# Patient Record
Sex: Male | Born: 1945 | Race: White | Hispanic: No | Marital: Married | Smoking: Former smoker
Health system: Southern US, Community
[De-identification: ages and names within clinical notes are randomized; demographics above are authoritative.]

## PROBLEM LIST (undated history)

## (undated) DIAGNOSIS — A159 Respiratory tuberculosis unspecified: Secondary | ICD-10-CM

## (undated) DIAGNOSIS — I219 Acute myocardial infarction, unspecified: Secondary | ICD-10-CM

## (undated) DIAGNOSIS — N400 Enlarged prostate without lower urinary tract symptoms: Secondary | ICD-10-CM

## (undated) DIAGNOSIS — IMO0002 Reserved for concepts with insufficient information to code with codable children: Secondary | ICD-10-CM

## (undated) DIAGNOSIS — J449 Chronic obstructive pulmonary disease, unspecified: Secondary | ICD-10-CM

## (undated) DIAGNOSIS — E039 Hypothyroidism, unspecified: Secondary | ICD-10-CM

## (undated) DIAGNOSIS — Z86718 Personal history of other venous thrombosis and embolism: Secondary | ICD-10-CM

## (undated) DIAGNOSIS — Z860100 Personal history of colon polyps, unspecified: Secondary | ICD-10-CM

## (undated) DIAGNOSIS — I639 Cerebral infarction, unspecified: Secondary | ICD-10-CM

## (undated) DIAGNOSIS — H04129 Dry eye syndrome of unspecified lacrimal gland: Secondary | ICD-10-CM

## (undated) DIAGNOSIS — K259 Gastric ulcer, unspecified as acute or chronic, without hemorrhage or perforation: Secondary | ICD-10-CM

## (undated) DIAGNOSIS — H409 Unspecified glaucoma: Secondary | ICD-10-CM

## (undated) DIAGNOSIS — N289 Disorder of kidney and ureter, unspecified: Secondary | ICD-10-CM

## (undated) DIAGNOSIS — Z5189 Encounter for other specified aftercare: Secondary | ICD-10-CM

## (undated) DIAGNOSIS — I10 Essential (primary) hypertension: Secondary | ICD-10-CM

## (undated) DIAGNOSIS — E785 Hyperlipidemia, unspecified: Secondary | ICD-10-CM

## (undated) DIAGNOSIS — I4891 Unspecified atrial fibrillation: Secondary | ICD-10-CM

## (undated) DIAGNOSIS — I2510 Atherosclerotic heart disease of native coronary artery without angina pectoris: Secondary | ICD-10-CM

## (undated) DIAGNOSIS — M1990 Unspecified osteoarthritis, unspecified site: Secondary | ICD-10-CM

## (undated) HISTORY — DX: Chronic obstructive pulmonary disease, unspecified: J44.9

## (undated) HISTORY — DX: Hypothyroidism, unspecified: E03.9

## (undated) HISTORY — DX: Gastric ulcer, unspecified as acute or chronic, without hemorrhage or perforation: K25.9

## (undated) HISTORY — PX: COLONOSCOPY: SHX174

## (undated) HISTORY — DX: Personal history of other venous thrombosis and embolism: Z86.718

## (undated) HISTORY — DX: Respiratory tuberculosis unspecified: A15.9

## (undated) HISTORY — DX: Essential (primary) hypertension: I10

## (undated) HISTORY — PX: LUMBAR SPINE SURGERY: SHX701

## (undated) HISTORY — DX: Hyperlipidemia, unspecified: E78.5

## (undated) HISTORY — PX: EYE SURGERY: SHX253

## (undated) HISTORY — PX: TRANSURETHRAL RESECTION OF PROSTATE: SHX73

## (undated) HISTORY — DX: Personal history of colon polyps, unspecified: Z86.0100

## (undated) HISTORY — DX: Disorder of kidney and ureter, unspecified: N28.9

## (undated) HISTORY — DX: Unspecified atrial fibrillation: I48.91

## (undated) HISTORY — DX: Encounter for other specified aftercare: Z51.89

## (undated) HISTORY — DX: Dry eye syndrome of unspecified lacrimal gland: H04.129

## (undated) HISTORY — DX: Unspecified glaucoma: H40.9

## (undated) HISTORY — DX: Atherosclerotic heart disease of native coronary artery without angina pectoris: I25.10

## (undated) HISTORY — DX: Cerebral infarction, unspecified: I63.9

## (undated) HISTORY — PX: ESOPHAGOGASTRODUODENOSCOPY: SHX1529

## (undated) HISTORY — DX: Reserved for concepts with insufficient information to code with codable children: IMO0002

## (undated) HISTORY — PX: HERNIA MESH REMOVAL: SHX1745

## (undated) HISTORY — PX: OTHER SURGICAL HISTORY: SHX169

## (undated) HISTORY — DX: Personal history of colonic polyps: Z86.010

## (undated) HISTORY — DX: Unspecified osteoarthritis, unspecified site: M19.90

## (undated) HISTORY — DX: Benign prostatic hyperplasia without lower urinary tract symptoms: N40.0

---
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
## 1990-11-09 DIAGNOSIS — IMO0002 Reserved for concepts with insufficient information to code with codable children: Secondary | ICD-10-CM

## 1990-11-09 HISTORY — DX: Reserved for concepts with insufficient information to code with codable children: IMO0002

## 2007-06-30 ENCOUNTER — Ambulatory Visit: Payer: Self-pay | Admitting: Family Medicine

## 2007-06-30 DIAGNOSIS — H81399 Other peripheral vertigo, unspecified ear: Secondary | ICD-10-CM | POA: Insufficient documentation

## 2007-06-30 DIAGNOSIS — I10 Essential (primary) hypertension: Secondary | ICD-10-CM | POA: Insufficient documentation

## 2007-06-30 DIAGNOSIS — M545 Low back pain, unspecified: Secondary | ICD-10-CM | POA: Insufficient documentation

## 2007-06-30 DIAGNOSIS — E785 Hyperlipidemia, unspecified: Secondary | ICD-10-CM | POA: Insufficient documentation

## 2007-07-26 DIAGNOSIS — N184 Chronic kidney disease, stage 4 (severe): Secondary | ICD-10-CM | POA: Insufficient documentation

## 2007-07-27 ENCOUNTER — Encounter: Payer: Self-pay | Admitting: Family Medicine

## 2007-08-01 ENCOUNTER — Ambulatory Visit: Payer: Self-pay | Admitting: Family Medicine

## 2007-10-03 ENCOUNTER — Ambulatory Visit: Payer: Self-pay | Admitting: Family Medicine

## 2008-01-04 ENCOUNTER — Ambulatory Visit: Payer: Self-pay | Admitting: Family Medicine

## 2008-01-04 DIAGNOSIS — K5900 Constipation, unspecified: Secondary | ICD-10-CM

## 2008-07-03 ENCOUNTER — Ambulatory Visit: Payer: Self-pay | Admitting: Family Medicine

## 2008-07-04 ENCOUNTER — Encounter: Payer: Self-pay | Admitting: Family Medicine

## 2008-07-05 LAB — CONVERTED CEMR LAB
CO2: 24 meq/L (ref 19–32)
Calcium: 9.7 mg/dL (ref 8.4–10.5)
Chloride: 105 meq/L (ref 96–112)
Creatinine, Ser: 1.72 mg/dL — ABNORMAL HIGH (ref 0.40–1.50)
Glucose, Bld: 87 mg/dL (ref 70–99)
Total Bilirubin: 0.3 mg/dL (ref 0.3–1.2)

## 2008-07-23 ENCOUNTER — Telehealth: Payer: Self-pay | Admitting: Family Medicine

## 2008-08-03 ENCOUNTER — Encounter: Payer: Self-pay | Admitting: Family Medicine

## 2008-08-03 ENCOUNTER — Telehealth: Payer: Self-pay | Admitting: Family Medicine

## 2008-11-09 DIAGNOSIS — I639 Cerebral infarction, unspecified: Secondary | ICD-10-CM

## 2008-11-09 DIAGNOSIS — I219 Acute myocardial infarction, unspecified: Secondary | ICD-10-CM

## 2008-11-09 HISTORY — DX: Acute myocardial infarction, unspecified: I21.9

## 2008-11-09 HISTORY — DX: Cerebral infarction, unspecified: I63.9

## 2009-03-05 ENCOUNTER — Encounter: Payer: Self-pay | Admitting: Family Medicine

## 2009-04-12 ENCOUNTER — Encounter: Payer: Self-pay | Admitting: Family Medicine

## 2009-04-22 ENCOUNTER — Ambulatory Visit: Payer: Self-pay | Admitting: Family Medicine

## 2009-04-22 DIAGNOSIS — Z8673 Personal history of transient ischemic attack (TIA), and cerebral infarction without residual deficits: Secondary | ICD-10-CM | POA: Insufficient documentation

## 2009-04-23 ENCOUNTER — Encounter: Payer: Self-pay | Admitting: Family Medicine

## 2009-04-23 LAB — CONVERTED CEMR LAB
CO2: 25 meq/L (ref 19–32)
Calcium: 9.6 mg/dL (ref 8.4–10.5)
Glucose, Bld: 97 mg/dL (ref 70–99)
Potassium: 4.8 meq/L (ref 3.5–5.3)
Sodium: 143 meq/L (ref 135–145)

## 2009-04-28 ENCOUNTER — Encounter: Payer: Self-pay | Admitting: Family Medicine

## 2009-04-29 ENCOUNTER — Ambulatory Visit: Payer: Self-pay | Admitting: Family Medicine

## 2009-04-29 LAB — HM COLONOSCOPY

## 2009-05-02 ENCOUNTER — Encounter: Payer: Self-pay | Admitting: Family Medicine

## 2009-05-07 ENCOUNTER — Encounter: Payer: Self-pay | Admitting: Family Medicine

## 2009-05-21 ENCOUNTER — Ambulatory Visit: Payer: Self-pay | Admitting: Family Medicine

## 2009-05-21 DIAGNOSIS — R609 Edema, unspecified: Secondary | ICD-10-CM | POA: Insufficient documentation

## 2009-05-21 LAB — CONVERTED CEMR LAB: Microalbumin U total vol: 30 mg/L

## 2009-05-22 ENCOUNTER — Encounter: Payer: Self-pay | Admitting: Family Medicine

## 2009-05-22 LAB — CONVERTED CEMR LAB
ALT: 19 units/L (ref 0–53)
CO2: 21 meq/L (ref 19–32)
Calcium: 9.7 mg/dL (ref 8.4–10.5)
Chloride: 106 meq/L (ref 96–112)
Free T4: 1.09 ng/dL (ref 0.80–1.80)
Glucose, Bld: 104 mg/dL — ABNORMAL HIGH (ref 70–99)
HCT: 39.8 % (ref 39.0–52.0)
MCV: 97.5 fL (ref 78.0–100.0)
Platelets: 247 10*3/uL (ref 150–400)
RBC: 4.08 M/uL — ABNORMAL LOW (ref 4.22–5.81)
Sodium: 142 meq/L (ref 135–145)
TSH: 4.512 microintl units/mL — ABNORMAL HIGH (ref 0.350–4.500)
Total Bilirubin: 0.5 mg/dL (ref 0.3–1.2)
Total Protein: 6.8 g/dL (ref 6.0–8.3)
WBC: 5.8 10*3/uL (ref 4.0–10.5)

## 2009-06-05 ENCOUNTER — Encounter: Payer: Self-pay | Admitting: Family Medicine

## 2009-06-25 ENCOUNTER — Encounter: Payer: Self-pay | Admitting: Family Medicine

## 2009-07-03 ENCOUNTER — Telehealth: Payer: Self-pay | Admitting: Family Medicine

## 2009-07-04 ENCOUNTER — Encounter: Payer: Self-pay | Admitting: Family Medicine

## 2009-07-05 ENCOUNTER — Encounter: Payer: Self-pay | Admitting: Family Medicine

## 2009-07-05 LAB — CONVERTED CEMR LAB
Free T4: 1.14 ng/dL (ref 0.80–1.80)
LDL Cholesterol: 136 mg/dL — ABNORMAL HIGH (ref 0–99)
T3, Free: 2.8 pg/mL (ref 2.3–4.2)
TSH: 7.442 microintl units/mL — ABNORMAL HIGH (ref 0.350–4.500)
VLDL: 44 mg/dL — ABNORMAL HIGH (ref 0–40)

## 2009-07-17 ENCOUNTER — Encounter: Payer: Self-pay | Admitting: Family Medicine

## 2009-08-05 ENCOUNTER — Encounter: Payer: Self-pay | Admitting: Family Medicine

## 2009-08-21 ENCOUNTER — Ambulatory Visit: Payer: Self-pay | Admitting: Family Medicine

## 2009-09-05 ENCOUNTER — Ambulatory Visit: Payer: Self-pay | Admitting: Family Medicine

## 2009-09-06 ENCOUNTER — Encounter: Payer: Self-pay | Admitting: Family Medicine

## 2009-09-09 ENCOUNTER — Telehealth: Payer: Self-pay | Admitting: Family Medicine

## 2009-09-09 ENCOUNTER — Encounter: Payer: Self-pay | Admitting: Family Medicine

## 2009-09-09 LAB — CONVERTED CEMR LAB
ALT: 13 units/L (ref 0–53)
AST: 14 units/L (ref 0–37)
Alkaline Phosphatase: 91 units/L (ref 39–117)
CO2: 25 meq/L (ref 19–32)
Creatinine, Ser: 1.66 mg/dL — ABNORMAL HIGH (ref 0.40–1.50)
Sodium: 140 meq/L (ref 135–145)
T3, Free: 2.6 pg/mL (ref 2.3–4.2)
TSH: 7.489 microintl units/mL — ABNORMAL HIGH (ref 0.350–4.500)
Total Bilirubin: 0.5 mg/dL (ref 0.3–1.2)
Total Protein: 7 g/dL (ref 6.0–8.3)

## 2009-12-17 ENCOUNTER — Ambulatory Visit: Payer: Self-pay | Admitting: Family Medicine

## 2009-12-17 DIAGNOSIS — N529 Male erectile dysfunction, unspecified: Secondary | ICD-10-CM | POA: Insufficient documentation

## 2009-12-18 LAB — CONVERTED CEMR LAB
Cholesterol: 227 mg/dL — ABNORMAL HIGH (ref 0–200)
HCT: 41.2 % (ref 39.0–52.0)
HDL: 37 mg/dL — ABNORMAL LOW (ref >39)
Platelets: 202 10*3/uL (ref 150–400)
RDW: 13.5 % (ref 11.5–15.5)
Sex Hormone Binding: 63 nmol/L (ref 13–71)
TSH: 5.404 microintl units/mL — ABNORMAL HIGH (ref 0.350–4.500)
Testosterone-% Free: 1.3 % — ABNORMAL LOW (ref 1.6–2.9)
Testosterone: 415.11 ng/dL (ref 350–890)

## 2010-01-28 ENCOUNTER — Ambulatory Visit: Payer: Self-pay | Admitting: Family Medicine

## 2010-01-28 DIAGNOSIS — E039 Hypothyroidism, unspecified: Secondary | ICD-10-CM | POA: Insufficient documentation

## 2010-02-05 ENCOUNTER — Encounter: Payer: Self-pay | Admitting: Family Medicine

## 2010-02-06 LAB — CONVERTED CEMR LAB: TSH: 5.718 microintl units/mL — ABNORMAL HIGH (ref 0.350–4.500)

## 2010-03-07 ENCOUNTER — Encounter: Payer: Self-pay | Admitting: Family Medicine

## 2010-04-29 ENCOUNTER — Encounter: Payer: Self-pay | Admitting: Family Medicine

## 2010-05-26 ENCOUNTER — Telehealth: Payer: Self-pay | Admitting: Family Medicine

## 2010-06-09 ENCOUNTER — Ambulatory Visit: Payer: Self-pay | Admitting: Family Medicine

## 2010-06-10 ENCOUNTER — Encounter: Payer: Self-pay | Admitting: Family Medicine

## 2010-06-10 LAB — CONVERTED CEMR LAB
ALT: 15 units/L (ref 0–53)
AST: 18 units/L (ref 0–37)
Albumin: 4.4 g/dL (ref 3.5–5.2)
BUN: 30 mg/dL — ABNORMAL HIGH (ref 6–23)
CO2: 20 meq/L (ref 19–32)
Calcium: 9.8 mg/dL (ref 8.4–10.5)
Chloride: 107 meq/L (ref 96–112)
Potassium: 5.2 meq/L (ref 3.5–5.3)
TSH: 1.146 microintl units/mL (ref 0.350–4.500)

## 2010-06-19 ENCOUNTER — Ambulatory Visit: Payer: Self-pay | Admitting: Cardiovascular Disease

## 2010-07-23 ENCOUNTER — Telehealth: Payer: Self-pay | Admitting: Family Medicine

## 2010-08-11 ENCOUNTER — Ambulatory Visit: Payer: Self-pay | Admitting: Family Medicine

## 2010-09-29 ENCOUNTER — Encounter: Payer: Self-pay | Admitting: Family Medicine

## 2010-10-13 ENCOUNTER — Telehealth: Payer: Self-pay | Admitting: Family Medicine

## 2010-10-14 ENCOUNTER — Encounter: Payer: Self-pay | Admitting: Family Medicine

## 2010-10-15 LAB — CONVERTED CEMR LAB
Cholesterol: 225 mg/dL — ABNORMAL HIGH (ref 0–200)
HDL: 45 mg/dL (ref >39)
Total CHOL/HDL Ratio: 5

## 2010-10-16 ENCOUNTER — Ambulatory Visit: Payer: Self-pay | Admitting: Internal Medicine

## 2010-10-23 ENCOUNTER — Telehealth (INDEPENDENT_AMBULATORY_CARE_PROVIDER_SITE_OTHER): Payer: Self-pay | Admitting: *Deleted

## 2010-10-23 ENCOUNTER — Encounter: Payer: Self-pay | Admitting: Family Medicine

## 2010-10-27 ENCOUNTER — Telehealth: Payer: Self-pay | Admitting: Family Medicine

## 2010-10-27 ENCOUNTER — Ambulatory Visit: Payer: Self-pay | Admitting: Family Medicine

## 2010-12-09 NOTE — Assessment & Plan Note (Signed)
Summary: 4 MONTH FU HTN, lipids, thyroid   Vital Signs:  Patient profile:   65 year old male Height:      67.5 inches Weight:      167 pounds BMI:     25.86 Pulse rate:   64 / minute BP sitting:   119 / 64  (left arm) Cuff size:   regular  Vitals Entered By: Isaias Cowman CMA, Deborra Medina) (June 09, 2010 8:43 AM) CC: F/U BP, Hypertension Management   Primary Care Provider:  Beatrice Lecher MD  CC:  F/U BP and Hypertension Management.  History of Present Illness: Denies any fatigue.  Occ SOB with acitivity.  But this is chronic.  Has not worsened.   Hypertension History:      He complains of dyspnea with exertion and side effects from treatment, but denies headache, chest pain, palpitations, orthopnea, PND, peripheral edema, visual symptoms, neurologic problems, and syncope.  He notes the following problems with antihypertensive medication side effects: No syncope but does occ feel lightheaded several times a week. Marland Kitchen        Positive major cardiovascular risk factors include male age 67 years old or older, hyperlipidemia, and hypertension.  Negative major cardiovascular risk factors include negative family history for ischemic heart disease and non-tobacco-user status.        Positive history for target organ damage include prior stroke (or TIA).     Current Medications (verified): 1)  Meclizine Hcl 12.5 Mg  Tabs (Meclizine Hcl) .... Use As Needed For Dizziness 2)  Diovan 40 Mg  Tabs (Valsartan) .... Take One Tablet By Mouth Once Adya 3)  Nabumetone 750 Mg  Tabs (Nabumetone) .... Take One Tablet By Mouth Once A Day 4)  Niacin 500 Mg Tabs (Niacin) .... Take 2  A Day. 5)  Verelan 180 Mg Xr24h-Cap (Verapamil Hcl) .... Take 1 Tablet By Mouth Two Times A Day 6)  Diazepam 2 Mg Tabs (Diazepam) .... Take 1 Tablet By Mouth Once A Day As Needed For Nausea and Dizziness. 7)  Plavix 75 Mg Tabs (Clopidogrel Bisulfate) .... Take One Tablet By Mouth Oncea  Day 8)  Medium Grade Compression  Stocking. Bilateral. .... Dx: Venous Stasis. Has Normal Abi.  Knee High Stockings Please. 9)  Levothroid 50 Mcg Tabs (Levothyroxine Sodium) .... Take 1 Tablet By Mouth Once A Day 10)  Compression Stockings. .... Below The Knee, Moderate Grade Compression Stockings.   Dx. Lower Extremity Edema.  Allergies (verified): 1)  ! Asa 2)  ! Lipitor 3)  ! * Simvastatin 4)  ! * Welchol  Comments:  Nurse/Medical Assistant: The patient's medications and allergies were reviewed with the patient and were updated in the Medication and Allergy Lists. Isaias Cowman CMA, Deborra Medina) (June 09, 2010 8:44 AM)  Physical Exam  General:  Well-developed,well-nourished,in no acute distress; alert,appropriate and cooperative throughout examination Lungs:  Normal respiratory effort, chest expands symmetrically. Lungs are clear to auscultation, no crackles or wheezes. Heart:  Normal rate and regular rhythm. S1 and S2 normal without gallop, murmur, click, rub or other extra sounds. Extremities:  NO LE edema.   Psych:  Cognition and judgment appear intact. Alert and cooperative with normal attention span and concentration. No apparent delusions, illusions, hallucinations   Impression & Recommendations:  Problem # 1:  HYPERTENSION, BENIGN ESSENTIAL (ICD-401.1) BP is excellent but he is having some dizzy episodes.  Will reduce his verapamil and see if helps dizzines and sexual SE.  F/U in 2 months to make sure  BP still well controlled and sexual SE are improved.  His updated medication list for this problem includes:    Diovan 40 Mg Tabs (Valsartan) .Marland Kitchen... Take one tablet by mouth once adya    Verelan 120 Mg Xr24h-cap (Verapamil hcl) .Marland Kitchen... Take 1 tablet by mouth two times a day  Orders: T-Comprehensive Metabolic Panel ([id number])  Problem # 2:  UNSPECIFIED HYPOTHYROIDISM (ICD-244.9)  Adjusted dose in March. Due to recheck.   His updated medication list for this problem includes:    Levothroid 50 Mcg Tabs  (Levothyroxine sodium) .Marland Kitchen... Take 1 tablet by mouth once a day  Orders: T-TSH [id number])  Problem # 3:  HYPERLIPIDEMIA NEC/NOS (ICD-272.4) I would reallly like him on the Niaspan but it is cost prohibitive for him.  Discussed will retry in Jan when insurance rolls over.  He is intolerant to statins and Welchol.  His updated medication list for this problem includes:    Niacin 500 Mg Tabs (Niacin) .Marland Kitchen... Take 2  a day.  Orders: T-Lipid Profile [id number])  Complete Medication List: 1)  Meclizine Hcl 12.5 Mg Tabs (Meclizine hcl) .... Use as needed for dizziness 2)  Diovan 40 Mg Tabs (Valsartan) .... Take one tablet by mouth once adya 3)  Nabumetone 750 Mg Tabs (Nabumetone) .... Take one tablet by mouth once a day 4)  Niacin 500 Mg Tabs (Niacin) .... Take 2  a day. 5)  Verelan 120 Mg Xr24h-cap (Verapamil hcl) .... Take 1 tablet by mouth two times a day 6)  Diazepam 2 Mg Tabs (Diazepam) .... Take 1 tablet by mouth once a day as needed for nausea and dizziness. 7)  Plavix 75 Mg Tabs (Clopidogrel bisulfate) .... Take one tablet by mouth oncea  day 8)  Medium Grade Compression Stocking. Bilateral.  .... Dx: venous stasis. has normal abi.  knee high stockings please. 9)  Levothroid 50 Mcg Tabs (Levothyroxine sodium) .... Take 1 tablet by mouth once a day 10)  Compression Stockings.  .... Below the knee, moderate grade compression stockings.   dx. lower extremity edema.  Hypertension Assessment/Plan:      The patient's hypertensive risk group is category C: Target organ damage and/or diabetes.  His calculated 10 year risk of coronary heart disease is 18 %.  Today's blood pressure is 119/64.  His blood pressure goal is < 140/90.  Patient Instructions: 1)  Please schedule a follow-up appointment in 2 months.  2)  I have decreased your verapamil to 120mg  instead of 180mg . Lets see if this reduces your dizziness and the sexual side effects.   3)  We will call you with your lab results  4)  In  January when insurance rolls over would like to try changing you to the presciption niacin and see if better covered this year.  Prescriptions: VERELAN 120 MG XR24H-CAP (VERAPAMIL HCL) Take 1 tablet by mouth two times a day  #60 x 3   Entered and Authorized by:   Beatrice Lecher MD   Signed by:   Beatrice Lecher MD on 06/09/2010   Method used:   Electronically to        [facility] [phone]* (retail)       [address] [place], [place]  [address]       Ph: [phone]       Fax: [phone]   RxID:   [id number]

## 2010-12-09 NOTE — Progress Notes (Signed)
Summary: Stop Plavix prior to colon  Phone Note From Other Clinic Call back at [phone]   Caller: Izora Gala w/ [facility] GI Call For: [facility] Summary of Call: Pt is scheduled for colonoscopy on 9/21. Is it ok for them to stop his Plavix 3-5 days before the procedure. Please advise Initial call taken by: Geanie Kenning,  July 23, 2010 10:03 AM  Follow-up for Phone Call        Yes, this is OK.  Follow-up by: Beatrice Lecher MD,  July 23, 2010 12:42 PM  Additional Follow-up for Phone Call Additional follow up Details #1::        Notified of above Additional Follow-up by: Geanie Kenning,  July 23, 2010 1:01 PM

## 2010-12-09 NOTE — Letter (Signed)
Summary: [facility] @ [facility] @ [place] By: Edmonia Akshath 05/14/2010 09:58:38  _____________________________________________________________________  External Attachment:    Type:   Image     Comment:   External Document

## 2010-12-09 NOTE — Assessment & Plan Note (Signed)
Summary: Roberto Fields   Visit Type:  Follow-up  CC:  dyslipidemia follow-up.  Lipid Management History:      Positive NCEP/ATP III risk factors include male age 67 years old or older, hypertension, and prior stroke (or TIA).  Negative NCEP/ATP III risk factors include non-diabetic, no family history for ischemic heart disease, non-tobacco-user status, no ASHD (atherosclerotic heart disease), no peripheral vascular disease, and no history of aortic aneurysm.     Lipid Clinic Visit      The patient comes in today for dyslipidemia follow-up.  The patient has no complaints of medication problems, chest pain, muscle aches, and muscle cramps.  He is currently taking Niacin 500mg  twice daily for his cholesterol.  He did state he has some "itching" spells.  He states they occur at various times and he cannot associate these with when he takes his medications.  He did say his PCP is aware.  Dietary compliance review reveals patient is complaint with a heart healthy diet.  For breakfast he will have all-bran cereal and occassionally egg substitues.  His wife cooks lunch and dinner.  Most of these meals are vegetables with meats only a few times a week.  He does not eat any pork.  Review of exercise habits reveals that the patient is walking, daily, and for 20-30 minutes.  He did state he has some CP occassionally when he walks.  CP not associated with any N/V or diaphoresis.  He states he has gone to the hospital for these pains in the past and was told related to arthritis.   Lipid Management Jillane Po  Porfirio Oar, PharmD  Current Medications (verified): 1)  Meclizine Hcl 12.5 Mg  Tabs (Meclizine Hcl) .... Use As Needed For Dizziness 2)  Diovan 40 Mg  Tabs (Valsartan) .... Take 1/2 Tablet By Mouth Once A Day 3)  Nabumetone 750 Mg  Tabs (Nabumetone) .... Take 1/2 Tablet By Mouth Once A Day 4)  Niacin 500 Mg Tabs (Niacin) .... Take 3  A Day. 5)  Verelan 120 Mg Xr24h-Cap (Verapamil Hcl) .... Take 1 Tablet By  Mouth Two Times A Day 6)  Diazepam 2 Mg Tabs (Diazepam) .... Take 1 Tablet By Mouth Once A Day As Needed For Nausea and Dizziness. 7)  Plavix 75 Mg Tabs (Clopidogrel Bisulfate) .... Take One Tablet By Mouth Oncea  Day 8)  Medium Grade Compression Stocking. Bilateral. .... Dx: Venous Stasis. Has Normal Abi.  Knee High Stockings Please. 9)  Levothroid 50 Mcg Tabs (Levothyroxine Sodium) .... Take 1 Tablet By Mouth Once A Day 10)  Compression Stockings. .... Below The Knee, Moderate Grade Compression Stockings.   Dx. Lower Extremity Edema.  Allergies: 1)  ! Asa 2)  ! Lipitor 3)  ! * Simvastatin 4)  ! * Welchol   Vital Signs:  Patient profile:   65 year old male Weight:      165 pounds Pulse rate:   75 / minute BP sitting:   136 / 66  Impression & Recommendations:  Problem # 1:  HYPERLIPIDEMIA NEC/NOS (ICD-272.4) TC- 225, TG- 174 (goal<150), HDL- 45 (goal>40), LDL- 145 (goal<100).  LDL has improved since increasing Niacin but still remain above goal.  TG have increased slightly.   Pt reports tolerating Niacin but I wonder if itching related to side effect of Niacin.  Asked pt to call if this continues or gets worse.  Given pt's long history of intolerance to medications, will opt to increase Niacin to 1500mg  daily rather than addition  of another agent.  His diet is well controlled so unsure what added benefit we would see from adding Zetia.  Will f/u in 4-5 months.   His updated medication list for this problem includes:    Niacin 500 Mg Tabs (Niacin) .Marland Kitchen... Take 3  a day.  Lipid Assessment/Plan:      Based on NCEP/ATP III, the patient's risk factor category is "history of coronary disease, peripheral vascular disease, cerebrovascular disease, or aortic aneurysm".  The patient's lipid goals are as follows: Total cholesterol goal is 200; LDL cholesterol goal is 100; HDL cholesterol goal is 40; Triglyceride goal is 150.     Patient Instructions: 1)  Increase Niacin to 3 tablets daily 2)   Continue low-fat, low-carbohydrate diet 3)  Continue to walk every day 4)  Recheck labs week of February 16, 2011 (fasting) 5)  Lipid Clinic Appt: February 19, 2011 at [facility]

## 2010-12-09 NOTE — Medication Information (Signed)
Summary: Lab Orders  Lab Orders   Imported By: Marilynne Drivers 07/03/2010 12:23:10  _____________________________________________________________________  External Attachment:    Type:   Image     Comment:   External Document

## 2010-12-09 NOTE — Progress Notes (Signed)
Summary: Refill  Phone Note Call from Patient   Caller: Patient Summary of Call:   [facility] [phone]         Call Back  [phone]  Patient need a Refill on Levothyroxin 56mcg tab Initial call taken by: [name],  May 26, 2010 8:26 AM    Prescriptions: LEVOTHROID 50 MCG TABS (LEVOTHYROXINE SODIUM) Take 1 tablet by mouth once a day  #30 x 2   Entered by:   Geanie Kenning   Authorized by:   Beatrice Lecher MD   Signed by:   Geanie Kenning on 05/26/2010   Method used:   Electronically to        [facility] [phone]* (retail)       [place], [place]  [address]       Ph: [phone]       Fax: [phone]   [id number]

## 2010-12-09 NOTE — Letter (Signed)
Summary: AUA & ED Questionnaire/[facility]  AUA & ED Questionnaire/[facility]   Imported By: Edmonia Kennth 12/20/2009 13:27:24  _____________________________________________________________________  External Attachment:    Type:   Image     Comment:   External Document

## 2010-12-09 NOTE — Progress Notes (Signed)
Summary: lab order faxed  Phone Note Call from Patient   Caller: Patient Summary of Call: Dr.Brytni Dray  patient need lab oder faxed to  get thyroid and chol check Initial call taken by: [name],  July 03, 2009 8:42 AM

## 2010-12-09 NOTE — Assessment & Plan Note (Signed)
Summary: new to lipid/hyperlipidemia   Primary Novaleigh Kohlman:  Beatrice Lecher MD   History of Present Illness: Mr. Lenon has been taking OTC Slo-niacin 500mg  each night.  Niaspan was cost-prohibitive.  He eats a lot of vegetables, very little meats other than ground beef.  He often eats all bran cereal and uses 2% milk.  He is high risk due to his history of stroke.   Mr. Delconte was referred to [place] from his PCP, Dr. Madilyn Fireman at [facility].  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  Lipid Management Meade Hogeland  Romeo Rabon, PharmD  Preventive Screening-Counseling & Management  Alcohol-Tobacco     Alcohol drinks/day: 0     Smoking Status: quit > 6 months  Caffeine-Diet-Exercise     Does Patient Exercise: no  Current Medications (verified): 1)  Meclizine Hcl 12.5 Mg  Tabs (Meclizine Hcl) .... Use As Needed For Dizziness 2)  Diovan 40 Mg  Tabs (Valsartan) .... Take One Tablet By Mouth Once Adya 3)  Nabumetone 750 Mg  Tabs (Nabumetone) .... Take One Tablet By Mouth Once A Day 4)  Niacin 500 Mg Tabs (Niacin) .... Take 2  A Day. 5)  Verelan 120 Mg Xr24h-Cap (Verapamil Hcl) .... Take 1 Tablet By Mouth Two Times A Day 6)  Diazepam 2 Mg Tabs (Diazepam) .... Take 1 Tablet By Mouth Once A Day As Needed For Nausea and Dizziness. 7)  Plavix 75 Mg Tabs (Clopidogrel Bisulfate) .... Take One Tablet By Mouth Oncea  Day 8)  Medium Grade Compression Stocking. Bilateral. .... Dx: Venous Stasis. Has Normal Abi.  Knee High Stockings Please. 9)  Levothroid 50 Mcg Tabs (Levothyroxine Sodium) .... Take 1 Tablet By Mouth Once A Day 10)  Compression Stockings. .... Below The Knee, Moderate Grade Compression Stockings.   Dx. Lower Extremity Edema.  Allergies (verified): 1)  ! Asa 2)  ! Lipitor 3)  ! * Simvastatin 4)  ! * Welchol  Social History: Alcohol drinks/day:  0 Smoking Status:  quit > 6  months   Vital Signs:  Patient profile:   65 year old male BP sitting:   115 / 60  (right arm)  Impression & Recommendations:  Problem # 1:  HYPERLIPIDEMIA NEC/NOS (ICD-272.4) TC 243. goal <200 TG 152. goal <150 HDL 46. goal >45 LDL 167. 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  Per records, he has been on simvastatin and atorvastatin but he states there were more.  He hasn't been taking niacin as prescribed (2 tabs/day), due to mis-understanding the instructions.  I asked him to increase to 2 tabs/day at bedtime.  I encouraged him to start walking for 20-30 minutes at least 4 days a week.  We talked about options including pravastatin, rosuvastatin and, ezetimibe.  He is resistant to start any of these for now.  We will check his progress in 4 months.  His updated medication list for this problem includes:    Niacin 500 Mg Tabs (Niacin) .Marland Kitchen... Take 2  a day.  Patient Instructions: 1)  1. Increase Slo-niacin 500mg  to TWO tablets every night.  Call if not tolerating this higher dose. 2)  2. Walk briskly for 20-30 minutes 5 days per week. 3)  4. Fasting blood work on 10/13/10 at 8:00am at Dr. Tommy Rainwater office in [place]. 4)  3. Return to our office on 10/16/10 at 11:00am

## 2010-12-09 NOTE — Assessment & Plan Note (Signed)
Summary: HTN, lipids   Vital Signs:  Patient profile:   65 year old male Height:      67.5 inches Weight:      164 pounds Pulse rate:   69 / minute BP sitting:   124 / 58  (right arm) Cuff size:   regular  Vitals Entered By: Isaias Cowman CMA, ([place]) (August 11, 2010 8:20 AM) CC: f/u BP   Primary Care Provider:  Beatrice Lecher MD  CC:  f/u BP.  History of Present Illness: Says his dizziness is not much better.  No sinus pain or pressure. Says the dizziness is worse when the weather changes  Says overall HA have resolved since we lowered his dose of the verapamil. Feels better.   Current Medications (verified): 1)  Meclizine Hcl 12.5 Mg  Tabs (Meclizine Hcl) .... Use As Needed For Dizziness 2)  Diovan 40 Mg  Tabs (Valsartan) .... Take One Tablet By Mouth Once Adya 3)  Nabumetone 750 Mg  Tabs (Nabumetone) .... Take One Tablet By Mouth Once A Day 4)  Niacin 500 Mg Tabs (Niacin) .... Take 2  A Day. 5)  Verelan 120 Mg Xr24h-Cap (Verapamil Hcl) .... Take 1 Tablet By Mouth Two Times A Day 6)  Diazepam 2 Mg Tabs (Diazepam) .... Take 1 Tablet By Mouth Once A Day As Needed For Nausea and Dizziness. 7)  Plavix 75 Mg Tabs (Clopidogrel Bisulfate) .... Take One Tablet By Mouth Oncea  Day 8)  Medium Grade Compression Stocking. Bilateral. .... Dx: Venous Stasis. Has Normal Abi.  Knee High Stockings Please. 9)  Levothroid 50 Mcg Tabs (Levothyroxine Sodium) .... Take 1 Tablet By Mouth Once A Day 10)  Compression Stockings. .... Below The Knee, Moderate Grade Compression Stockings.   Dx. Lower Extremity Edema.  Allergies (verified): 1)  ! Asa 2)  ! Lipitor 3)  ! * Simvastatin 4)  ! * Welchol  Comments:  Nurse/Medical Assistant: The patient's medications and allergies were reviewed with the patient and were updated in the Medication and Allergy Lists. [place], Deborra Medina) (August 11, 2010 8:20 AM)  Physical Exam  General:  Well-developed,well-nourished,in no acute  distress; alert,appropriate and cooperative throughout examination Head:  Normocephalic and atraumatic without obvious abnormalities. No apparent alopecia or balding. Eyes:  No corneal or conjunctival inflammation noted. EOMI. Perrla.  Lungs:  Normal respiratory effort, chest expands symmetrically. Lungs are clear to auscultation, no crackles or wheezes. Heart:  Normal rate and regular rhythm. S1 and S2 normal without gallop, murmur, click, rub or other extra sounds. No carotid bruits.  Skin:  no rashes.   Cervical Nodes:  No lymphadenopathy noted Psych:  Cognition and judgment appear intact. Alert and cooperative with normal attention span and concentration. No apparent delusions, illusions, hallucinations   Impression & Recommendations:  Problem # 1:  HYPERTENSION, BENIGN ESSENTIAL (ICD-401.1) He is happier with his current regimen of the lower dose of verapamil. Looks great.  F/U in 6 months.  His updated medication list for this problem includes:    Diovan 40 Mg Tabs (Valsartan) .Marland Kitchen... Take one tablet by mouth once adya    Verelan 120 Mg Xr24h-cap (Verapamil hcl) .Marland Kitchen... Take 1 tablet by mouth two times a day  Problem # 2:  HYPERLIPIDEMIA NEC/NOS (ICD-272.4) Has been walking regularly. Has lost a few pounds. Intolerant to several stating.  Taking medication well. Will try to retry niaspan in January once insurance rolls over.. . Discussed f/u wtih Dr. Burt Knack in December.  His updated medication list for this problem includes:    Niacin 500 Mg Tabs (Niacin) .Marland Kitchen... Take 2  a day.  Complete Medication List: 1)  Meclizine Hcl 12.5 Mg Tabs (Meclizine hcl) .... Use as needed for dizziness 2)  Diovan 40 Mg Tabs (Valsartan) .... Take one tablet by mouth once adya 3)  Nabumetone 750 Mg Tabs (Nabumetone) .... Take one tablet by mouth once a day 4)  Niacin 500 Mg Tabs (Niacin) .... Take 2  a day. 5)  Verelan 120 Mg Xr24h-cap (Verapamil hcl) .... Take 1 tablet by mouth two times a day 6)   Diazepam 2 Mg Tabs (Diazepam) .... Take 1 tablet by mouth once a day as needed for nausea and dizziness. 7)  Plavix 75 Mg Tabs (Clopidogrel bisulfate) .... Take one tablet by mouth oncea  day 8)  Medium Grade Compression Stocking. Bilateral.  .... Dx: venous stasis. has normal abi.  knee high stockings please. 9)  Levothroid 50 Mcg Tabs (Levothyroxine sodium) .... Take 1 tablet by mouth once a day 10)  Compression Stockings.  .... Below the knee, moderate grade compression stockings.   dx. lower extremity edema.  Other Orders: Influenza Vaccine MCR [id number])  Patient Instructions: 1)  Please schedule a follow-up appointment in 6 months .    Immunizations Administered:  Influenza Vaccine # 1:    Vaccine Type: Fluvax MCR    Site: left deltoid    Mfr: [employer]    Dose: 0.5 ml    Route: IM    Given by: Seth Bake McCrimmon CMA, ([place])    Exp. Date: 05/09/2011    Lot #: [id number]    VIS given: 06/03/10 version given August 11, 2010.  Flu Vaccine Consent Questions:    Do you have a history of severe allergic reactions to this vaccine? no    Any prior history of allergic reactions to egg and/or gelatin? no    Do you have a sensitivity to the preservative Thimersol? no    Do you have a past history of Guillan-Barre Syndrome? no    Do you currently have an acute febrile illness? no    Have you ever had a severe reaction to latex? no    Vaccine information given and explained to patient? no

## 2010-12-09 NOTE — Assessment & Plan Note (Signed)
Summary: 6 WEEK FU HTN, thyroid, etc.    Vital Signs:  Patient profile:   65 year old male Height:      67.5 inches Weight:      170 pounds Pulse rate:   66 / minute BP sitting:   125 / 61  (left arm) Cuff size:   regular  Vitals Entered By: Geanie Kenning (January 28, 2010 9:19 AM) CC: followup BP, Hypertension Management   Primary Care Provider:  Beatrice Lecher MD  CC:  followup BP and Hypertension Management.  History of Present Illness: followup BP.  Has had some nasal congestionf ro 2 weeks.  Did try some cold sinus tablets but not relief. No HA or facial pain or pressure.  No Fever or ST or ear pain. No sneezing or watery eyes.   Says his compression stocking are causing an itch and rash. Say they are black and old and wonders if the dye is causing the reaction. Says he washes them in the same thing that he washes his clothes in and has not other rashes.  Not using any creams, etc.     Wants to know if ok to use OTC prilosec.    Hypertension History:      He denies headache, chest pain, palpitations, dyspnea with exertion, orthopnea, PND, peripheral edema, visual symptoms, neurologic problems, syncope, and side effects from treatment.  He notes no problems with any antihypertensive medication side effects.        Positive major cardiovascular risk factors include male age 43 years old or older, hyperlipidemia, and hypertension.  Negative major cardiovascular risk factors include negative family history for ischemic heart disease and non-tobacco-user status.        Positive history for target organ damage include prior stroke (or TIA).     Current Medications (verified): 1)  Meclizine Hcl 12.5 Mg  Tabs (Meclizine Hcl) .... Use As Needed For Dizziness 2)  Diovan 40 Mg  Tabs (Valsartan) .... Take One Tablet By Mouth Once Adya 3)  Nabumetone 750 Mg  Tabs (Nabumetone) .... Take One Tablet By Mouth Once A Day 4)  Niacin 500 Mg Tabs (Niacin) .... Take 2  A Day. 5)  Verelan 180 Mg  Xr24h-Cap (Verapamil Hcl) .... Take 1 Tablet By Mouth Two Times A Day 6)  Diazepam 2 Mg Tabs (Diazepam) .... Take 1 Tablet By Mouth Once A Day As Needed For Nausea and Dizziness. 7)  Plavix 75 Mg Tabs (Clopidogrel Bisulfate) .... Take One Tablet By Mouth Oncea  Day 8)  Medium Grade Compression Stocking. Bilateral. .... Dx: Venous Stasis. Has Normal Abi.  Knee High Stockings Please. 9)  Levothroid 25 Mcg Tabs (Levothyroxine Sodium) .... Take 1 Tablet By Mouth Once A Day in Am About 1 Hour Before Breakfast.  Allergies (verified): 1)  ! Asa 2)  ! Lipitor 3)  ! * Simvastatin 4)  ! * Welchol  Comments:  Nurse/Medical Assistant: The patient's medications and allergies were reviewed with the patient and were updated in the Medication and Allergy Lists. Geanie Kenning (January 28, 2010 9:20 AM)  Physical Exam  General:  Well-developed,well-nourished,in no acute distress; alert,appropriate and cooperative throughout examination Head:  Normocephalic and atraumatic without obvious abnormalities. No apparent alopecia or balding. Eyes:  EOMi.  Ears:  External ear exam shows no significant lesions or deformities.  Otoscopic examination reveals clear canals, tympanic membranes are intact bilaterally without bulging, retraction, inflammation or discharge. Hearing is grossly normal bilaterally. Nose:  External nasal examination shows no deformity  or inflammation. Nasal mucosa are pink and moist without lesions or exudates. Mouth:  Oral mucosa and oropharynx without lesions or exudates.  Teeth in good repair. Neck:  No deformities, masses, or tenderness noted. Chest Wall:  No deformities, masses, tenderness or gynecomastia noted. Lungs:  Normal respiratory effort, chest expands symmetrically. Lungs are clear to auscultation, no crackles or wheezes. Heart:  Normal rate and regular rhythm. S1 and S2 normal without gallop, murmur, click, rub or other extra sounds. No carotid bruits.  Pulses:  Radial 2+    Neurologic:  alert & oriented X3.   Skin:  Has erythematous fine papules and plaques on his shins bilaterally.  Skiin is a litle thickened.   Cervical Nodes:  No lymphadenopathy noted Psych:  Cognition and judgment appear intact. Alert and cooperative with normal attention span and concentration. No apparent delusions, illusions, hallucinations   Impression & Recommendations:  Problem # 1:  HYPERTENSION, BENIGN ESSENTIAL (ICD-401.1) Looks great today. AT goal.  His updated medication list for this problem includes:    Diovan 40 Mg Tabs (Valsartan) .Marland Kitchen... Take one tablet by mouth once adya    Verelan 180 Mg Xr24h-cap (Verapamil hcl) .Marland Kitchen... Take 1 tablet by mouth two times a day  BP today: 125/61 Prior BP: 129/63 (12/17/2009)  Prior 10 Yr Risk Heart Disease: 18 % (07/03/2008)  Labs Reviewed: K+: 5.0 (09/06/2009) Creat: : 1.66 (09/06/2009)   Chol: 227 (12/17/2009)   HDL: 37 (12/17/2009)   LDL: 154 (12/17/2009)   TG: 178 (12/17/2009)  Problem # 2:  UNSPECIFIED HYPOTHYROIDISM (ICD-244.9)  Hasn't noticed much differnece on the medication.  has been out for 5 days so recommend restar and then go for labs in one week.  His updated medication list for this problem includes:    Levothroid 25 Mcg Tabs (Levothyroxine sodium) .Marland Kitchen... Take 1 tablet by mouth once a day in am about 1 hour before breakfast.  Orders: T-TSH [id number])  Problem # 3:  CONTACT DERMATITIS (ICD-692.9) Can use topical hydrocortisone cream as needed for signs relief and then will write new rx for compression stocking.recommend trying the white or nude color.    Problem # 4:  URI (ICD-465.9) I think he either has URI vs allergies. Will treat with singulair samples for 4-5 days, if not better then call the office back and will put on ABX since has had sxs for 2 weeks.   His updated medication list for this problem includes:    Nabumetone 750 Mg Tabs (Nabumetone) .Marland Kitchen... Take one tablet by mouth once a day  Problem # 5:   PEDAL EDEMA (ICD-782.3) Can use topical hydrocortisone cream as needed for signs relief and then will write new rx for compression stocking.recommend trying the white or nude color.    Complete Medication List: 1)  Meclizine Hcl 12.5 Mg Tabs (Meclizine hcl) .... Use as needed for dizziness 2)  Diovan 40 Mg Tabs (Valsartan) .... Take one tablet by mouth once adya 3)  Nabumetone 750 Mg Tabs (Nabumetone) .... Take one tablet by mouth once a day 4)  Niacin 500 Mg Tabs (Niacin) .... Take 2  a day. 5)  Verelan 180 Mg Xr24h-cap (Verapamil hcl) .... Take 1 tablet by mouth two times a day 6)  Diazepam 2 Mg Tabs (Diazepam) .... Take 1 tablet by mouth once a day as needed for nausea and dizziness. 7)  Plavix 75 Mg Tabs (Clopidogrel bisulfate) .... Take one tablet by mouth oncea  day 8)  Medium Grade Compression Stocking. Bilateral.  ....  Dx: venous stasis. has normal abi.  knee high stockings please. 9)  Levothroid 25 Mcg Tabs (Levothyroxine sodium) .... Take 1 tablet by mouth once a day in am about 1 hour before breakfast. 10)  Compression Stockings.  .... Below the knee, moderate grade compression stockings.   dx. lower extremity edema.  Hypertension Assessment/Plan:      The patient's hypertensive risk group is category C: Target organ damage and/or diabetes.  His calculated 10 year risk of coronary heart disease is 18 %.  Today's blood pressure is 125/61.  His blood pressure goal is < 140/90.  Patient Instructions: 1)  Ok to use prilosec as needed for heartburn. Prefer that you NOT use tagamet as can interfere with your medications.  2)  For the nasal congestion try the singulair 10mg  once a day for 4-5 days. If congestion is not better then call me and let me know and will put you on an antibiotic.   3)  REcommend restart your your thyroid pill (levothyroxine) and once has been on it for one week then please go to the lab and have your thyroid rechecked.    4)  Please schedule a follow-up appointment  in 4 months .  Prescriptions: COMPRESSION STOCKINGS. Below the knee, moderate grade compression stockings.   Dx. Lower extremity edema.  #1 pair x 1   Entered and Authorized by:   Beatrice Lecher MD   Signed by:   Beatrice Lecher MD on 01/28/2010   Method used:   Print then Give to Patient   RxID:   [id number] LEVOTHROID 25 MCG TABS (LEVOTHYROXINE SODIUM) Take 1 tablet by mouth once a day in AM about 1 hour before breakfast.  #30 x 2   Entered and Authorized by:   Beatrice Lecher MD   Signed by:   Beatrice Lecher MD on 01/28/2010   Method used:   Electronically to        [facility] [phone]* (retail)       [address] [place], [place]  [address]       Ph: [phone]       Fax: [phone]   RxID:   [id number]

## 2010-12-09 NOTE — Assessment & Plan Note (Signed)
Summary: HTN, ED   Vital Signs:  Patient profile:   65 year old male Height:      67.5 inches Weight:      171.50 pounds BMI:     26.56 Temp:     97.8 degrees F oral Pulse rate:   61 / minute BP sitting:   129 / 63  Vitals Entered By: Verdie Mosher (December 17, 2009 10:27 AM) CC: FOLLOWUP ON BP PER PT, Hypertension Management   Primary Care Provider:  Beatrice Lecher MD  CC:  FOLLOWUP ON BP PER PT and Hypertension Management.  History of Present Illness: FOLLOWUP ON BP PER PT.  Still feels dizzy. Has had neurology workup. Does say that when he takes two tabs of verapamil actualy feels his dizziness is better.   C/O dec libido since coming home from teh hospital.  Also having problems with ED as well.  Having difficuly maintaing erection and with penetration.  Denies any urinary sxs or penile lesions. Wonders if any of his meds may be causing ED.  Only sexual partner is his wife.   Hypertension History:      He denies headache, chest pain, palpitations, dyspnea with exertion, orthopnea, PND, peripheral edema, visual symptoms, neurologic problems, syncope, and side effects from treatment.  He notes no problems with any antihypertensive medication side effects.  He is actually taking two tabs oaf the verapamil and doing well on this.  .        Positive major cardiovascular risk factors include male age 84 years old or older, hyperlipidemia, and hypertension.  Negative major cardiovascular risk factors include negative family history for ischemic heart disease and non-tobacco-user status.        Positive history for target organ damage include prior stroke (or TIA).     Allergies: 1)  ! Asa 2)  ! Lipitor 3)  ! * Simvastatin 4)  ! * Welchol  Physical Exam  General:  Well-developed,well-nourished,in no acute distress; alert,appropriate and cooperative throughout examination Head:  Normocephalic and atraumatic without obvious abnormalities. No apparent alopecia or balding. Eyes:   No corneal or conjunctival inflammation noted.  Lungs:  Normal respiratory effort, chest expands symmetrically. Lungs are clear to auscultation, no crackles or wheezes. Heart:  Normal rate and regular rhythm. S1 and S2 normal without gallop, murmur, click, rub or other extra sounds.  N carotid bruits.  Skin:  no rashes.   Cervical Nodes:  No lymphadenopathy noted Psych:  Cognition and judgment appear intact. Alert and cooperative with normal attention span and concentration. No apparent delusions, illusions, hallucinations   Impression & Recommendations:  Problem # 1:  HYPERTENSION, BENIGN ESSENTIAL (ICD-401.1) BP looks great today. Will change rx for the verapamil to 2 tabs daily.  His updated medication list for this problem includes:    Diovan 40 Mg Tabs (Valsartan) .Marland Kitchen... Take one tablet by mouth once adya    Verelan 180 Mg Xr24h-cap (Verapamil hcl) .Marland Kitchen... Take 1 tablet by mouth two times a day  Orders: Prescription Created Electronically [phone])  BP today: 171/08 Prior BP: 119/64 (09/05/2009)  Prior 10 Yr Risk Heart Disease: 18 % (07/03/2008)  Labs Reviewed: K+: 5.0 (09/06/2009) Creat: : 1.66 (09/06/2009)   Chol: 217 (07/04/2009)   HDL: 37 (07/04/2009)   LDL: 136 (07/04/2009)   TG: 219 (07/04/2009)  Problem # 2:  ERECTILE DYSFUNCTION, ORGANIC (ICD-607.84) AUA sxs score was  o.  ED score was 12 which is +.  Testosterone questionnaire was neg.  Will check testosterone levels  and CBC to rule out anemia. He is due to repeat TSH as was abnormal in october. Also his BP meds may be causing some of his ED.  If labs normal then consider an ED drug  Orders: T-Testosterone, Free and Total [phone]) T-CBC No Diff [id number])  Problem # 3:  BLOOD CHEMISTRY, ABNORMAL (ICD-790.99) Due to recheck TSH. Was abnormal in October.  Orders: T-TSH [id number])  Complete Medication List: 1)  Meclizine Hcl 12.5 Mg Tabs (Meclizine hcl) .... Use as needed for dizziness 2)  Diovan 40 Mg  Tabs (Valsartan) .... Take one tablet by mouth once adya 3)  Nabumetone 750 Mg Tabs (Nabumetone) .... Take one tablet by mouth once a day 4)  Niacin 500 Mg Tabs (Niacin) .... Take 2  a day. 5)  Verelan 180 Mg Xr24h-cap (Verapamil hcl) .... Take 1 tablet by mouth two times a day 6)  Diazepam 2 Mg Tabs (Diazepam) .... Take 1 tablet by mouth once a day as needed for nausea and dizziness. 7)  Plavix 75 Mg Tabs (Clopidogrel bisulfate) .... Take one tablet by mouth oncea  day 8)  Medium Grade Compression Stocking. Bilateral.  .... Dx: venous stasis. has normal abi.  knee high stockings please.  Other Orders: T-Lipid Profile [id number])  Hypertension Assessment/Plan:      The patient's hypertensive risk group is category C: Target organ damage and/or diabetes.  His calculated 10 year risk of coronary heart disease is 18 %.  Today's blood pressure is 129/63.  His blood pressure goal is < 140/90. Prescriptions: VERELAN 180 MG XR24H-CAP (VERAPAMIL HCL) Take 1 tablet by mouth two times a day  #180 x 2   Entered and Authorized by:   Beatrice Lecher MD   Signed by:   Beatrice Lecher MD on 12/17/2009   Method used:   Electronically to        [facility] [phone]* (retail)       [address] [place], [place]  [address]       Ph: [phone]       Fax: [phone]   RxID:   [id number]

## 2010-12-09 NOTE — Letter (Signed)
Summary: [facility] @ [facility] @ [place] By: Edmonia Cashawn 06/19/2010 12:32:33  _____________________________________________________________________  External Attachment:    Type:   Image     Comment:   External Document

## 2010-12-11 NOTE — Therapy (Signed)
Summary: Tympanogram/[facility]  Tympanogram/[facility]   Imported By: Edmonia George 11/05/2010 12:24:14  _____________________________________________________________________  External Attachment:    Type:   Image     Comment:   External Document

## 2010-12-11 NOTE — Assessment & Plan Note (Signed)
Summary: Abnormal ear exam.    Vital Signs:  Patient profile:   65 year old male Height:      67.5 inches Weight:      166 pounds Temp:     98.3 degrees F oral Pulse rate:   81 / minute BP sitting:   132 / 69  (right arm) Cuff size:   regular  Vitals Entered By: Isaias Cowman CMA, Deborra Medina) (October 27, 2010 2:11 PM) CC: rt ear infection, went to have test done on his ear and was told he had an ear infection   Primary Care Provider:  Beatrice Lecher MD  CC:  rt ear infection and went to have test done on his ear and was told he had an ear infection.  History of Present Illness: rt ear infection, went to have test done on his ear and was told he had an ear infection. Went to [facility] to have some tests doen for vertigo and had an ear infeciton. No pain or fever or drainage. They were unalbe to complete his testing.He does note that he had a head cold for 2 weeks adn did have this when they were testing him. He says he is feeling better now.   Current Medications (verified): 1)  Meclizine Hcl 12.5 Mg  Tabs (Meclizine Hcl) .... Use As Needed For Dizziness 2)  Diovan 40 Mg  Tabs (Valsartan) .... Take 1/2 Tablet By Mouth Once A Day 3)  Nabumetone 750 Mg  Tabs (Nabumetone) .... Take 1/2 Tablet By Mouth Once A Day 4)  Niacin 500 Mg Tabs (Niacin) .... Take 3  A Day. 5)  Verelan 120 Mg Xr24h-Cap (Verapamil Hcl) .... Take 1 Tablet By Mouth Two Times A Day 6)  Diazepam 2 Mg Tabs (Diazepam) .... Take 1 Tablet By Mouth Once A Day As Needed For Nausea and Dizziness. 7)  Plavix 75 Mg Tabs (Clopidogrel Bisulfate) .... Take One Tablet By Mouth Oncea  Day 8)  Medium Grade Compression Stocking. Bilateral. .... Dx: Venous Stasis. Has Normal Abi.  Knee High Stockings Please. 9)  Levothroid 50 Mcg Tabs (Levothyroxine Sodium) .... Take 1 Tablet By Mouth Once A Day 10)  Compression Stockings. .... Below The Knee, Moderate Grade Compression Stockings.   Dx. Lower Extremity Edema.  Allergies  (verified): 1)  ! Asa 2)  ! Lipitor 3)  ! * Simvastatin 4)  ! * Welchol  Comments:  Nurse/Medical Assistant: The patient's medications and allergies were reviewed with the patient and were updated in the Medication and Allergy Lists. Isaias Cowman CMA, Deborra Medina) (October 27, 2010 2:12 PM)  Physical Exam  General:  Well-developed,well-nourished,in no acute distress; alert,appropriate and cooperative throughout examination Head:  Normocephalic and atraumatic without obvious abnormalities. No apparent alopecia or balding. Eyes:  No corneal or conjunctival inflammation noted. EOMI. Perrla.  Ears:  Left TM and canal are clear. Right canal is clear and the TM does have a large scar.  Skin:  no rashes.   Cervical Nodes:  No lymphadenopathy noted Psych:  Cognition and judgment appear intact. Alert and cooperative with normal attention span and concentration. No apparent delusions, illusions, hallucinations   Impression & Recommendations:  Problem # 1:  OTALGIA (ICD-388.70)  He is not having any ear sxs. I would suspect based on his hx that likely he had fluid in the ear during his URI that affected his test. We did tympanometry today and it was normla meaning there is not fluid or sign of infection and his ear exam  is normal. We will call where he had testing and talk with them but I dont think he needs any further tx on our end.   Orders: Tympanometry [id number])  Complete Medication List: 1)  Meclizine Hcl 12.5 Mg Tabs (Meclizine hcl) .... Use as needed for dizziness 2)  Diovan 40 Mg Tabs (Valsartan) .... Take 1/2 tablet by mouth once a day 3)  Nabumetone 750 Mg Tabs (Nabumetone) .... Take 1/2 tablet by mouth once a day 4)  Niacin 500 Mg Tabs (Niacin) .... Take 3  a day. 5)  Verelan 120 Mg Xr24h-cap (Verapamil hcl) .... Take 1 tablet by mouth two times a day 6)  Diazepam 2 Mg Tabs (Diazepam) .... Take 1 tablet by mouth once a day as needed for nausea and dizziness. 7)  Plavix 75 Mg Tabs  (Clopidogrel bisulfate) .... Take one tablet by mouth oncea  day 8)  Medium Grade Compression Stocking. Bilateral.  .... Dx: venous stasis. has normal abi.  knee high stockings please. 9)  Levothroid 50 Mcg Tabs (Levothyroxine sodium) .... Take 1 tablet by mouth once a day 10)  Compression Stockings.  .... Below the knee, moderate grade compression stockings.   dx. lower extremity edema.  Patient Instructions: 1)  Call us with the name and place where you had your ears tested.    Orders Added: 1)  Est. Patient Level III [id number] 2)  Tympanometry [id number]  Appended Document: Abnormal ear exam.  REcieved the notes from VNG testing done at [facility].  He did have abnormal tymponometry of the right ear. I called and let him know likely from his URI he had at the teime and likely has some fluid in the ear but on the repeat tympanometry we did here in the office it was normal. Of not his URI had resolved by that time.

## 2010-12-11 NOTE — Progress Notes (Signed)
Summary: NEED PRESCIPTION FOR EAR INFEC  Phone Note Call from Patient   Caller: Spouse Summary of Call: PT'S WIFE CALLED ADV PT HAS EAR INFECTION AND WANTS TO KNOW IF DR. Madilyn Fireman CAN CALL HIM IN A PRESCRIPTION. DR. Madilyn Fireman IS OUT OF OFFICE TOMORROW AND DR. Valetta Close BOOKED.Fransisco Beau A CALL BACK TODAY IF POSSIBLE [phone]. THANKS Initial call taken by: Criss Alvine,  October 23, 2010 4:20 PM  Follow-up for Phone Call        Pt's wife advised to take Pt to UC Follow-up by: Sherlean Foot CMA,  October 23, 2010 4:35 PM

## 2010-12-11 NOTE — Therapy (Signed)
Summary: ENG & VNG Questionnaire/[facility]  ENG & VNG Questionnaire/[facility]   Imported By: Edmonia Rickard 11/17/2010 12:36:11  _____________________________________________________________________  External Attachment:    Type:   Image     Comment:   External Document

## 2010-12-11 NOTE — Therapy (Signed)
Summary: Tympanometry/[facility]  Tympanometry/[facility]   Imported By: Edmonia Cleburn 11/17/2010 12:34:59  _____________________________________________________________________  External Attachment:    Type:   Image     Comment:   External Document

## 2010-12-11 NOTE — Progress Notes (Signed)
Summary: PT LEFT A MESSG FOR DR. Madilyn Fireman  Phone Note Call from Patient   Caller: Patient Summary of Call: PT CALLED ADVISED THAT HE WENT TO [facility]  ON [address] IN [place]. THE TECH THAT HE SAW WAS EDWARD ASHLEY JR. PHONE # [phone]. PT SAID DR. Madilyn Fireman WANTED TO CALL AND SEE WHAT KIND OF TEST THEY HAD DONE FOR MR. [facility]. THANKS Initial call taken by: Criss Alvine,  October 27, 2010 3:14 PM  Follow-up for Phone Call        Adrea will you call them and see if they can fax report from his exam and get their phone number.  Follow-up by: Beatrice Lecher MD,  October 27, 2010 3:18 PM  Additional Follow-up for Phone Call Additional follow up Details #1::        called [facility] and they will fax report shortly Additional Follow-up by: Isaias Cowman CMA, Deborra Medina),  October 28, 2010 8:47 AM

## 2010-12-11 NOTE — Therapy (Signed)
Summary: [facility]  [facility]   Imported By: Edmonia Jerral 11/17/2010 12:34:08  _____________________________________________________________________  External Attachment:    Type:   Image     Comment:   External Document

## 2011-01-12 ENCOUNTER — Telehealth: Payer: Self-pay | Admitting: Family Medicine

## 2011-01-13 ENCOUNTER — Encounter: Payer: Self-pay | Admitting: Family Medicine

## 2011-01-19 ENCOUNTER — Encounter: Payer: Self-pay | Admitting: Family Medicine

## 2011-01-19 ENCOUNTER — Ambulatory Visit (INDEPENDENT_AMBULATORY_CARE_PROVIDER_SITE_OTHER): Payer: Medicare Other | Admitting: Family Medicine

## 2011-01-19 DIAGNOSIS — R0602 Shortness of breath: Secondary | ICD-10-CM | POA: Insufficient documentation

## 2011-01-19 DIAGNOSIS — I209 Angina pectoris, unspecified: Secondary | ICD-10-CM | POA: Insufficient documentation

## 2011-01-19 DIAGNOSIS — E039 Hypothyroidism, unspecified: Secondary | ICD-10-CM

## 2011-01-19 LAB — PULMONARY FUNCTION TEST

## 2011-01-20 ENCOUNTER — Telehealth: Payer: Self-pay | Admitting: Family Medicine

## 2011-01-20 NOTE — Progress Notes (Signed)
Summary: Chest pain. Advised ED  Phone Note Call from Patient   Caller: Patient Summary of Call: Pt called to schedule OV for chest pain x 1 week. I spoke w/ Pt and he states that chest pain is worse w/ exertion and worse today. I advised Pt to have family member take him to ED. Pt agreed to have daughter drive him now.  Initial call taken by: Sherlean Foot CMA,  January 12, 2011 1:14 PM

## 2011-01-27 NOTE — Assessment & Plan Note (Signed)
Summary: Hosp F/U   Vital Signs:  Patient profile:   65 year old male Height:      67.5 inches Weight:      166 pounds O2 Sat:      98 % Pulse rate:   90 / minute BP sitting:   140 / 70  (right arm) Cuff size:   regular  Vitals Entered By: Isaias Cowman CMA, Deborra Medina) (January 19, 2011 11:12 AM) CC: hosp f/u   Primary Care Provider:  Beatrice Lecher MD  CC:  hosp f/u.  History of Present Illness: Went to the ED for chest pain.  chest pain with walking and better when rests.  Has a f/u with Cardiology on May 1st. Still having pain across his chest with walking, usually starts after about 5 mintes. No diaphoresis or arm pain.  No cough or wheeze. Says the pain eases off after a couple of minutes if stops to rest.  Feels a little SOB lately.  Also put on iron tablet at discharge. He does have a very strong family hx of  CAD. Former smoker.Per wife had a cath maybe 5+ years ago that was normla.   Current Medications (verified): 1)  Meclizine Hcl 12.5 Mg  Tabs (Meclizine Hcl) .... Use As Needed For Dizziness 2)  Diovan 40 Mg  Tabs (Valsartan) .... Take 1/2 Tablet By Mouth Once A Day 3)  Nabumetone 750 Mg  Tabs (Nabumetone) .... Take 1/2 Tablet By Mouth Once A Day 4)  Niacin 500 Mg Tabs (Niacin) .... Take 3  A Day. 5)  Verelan 120 Mg Xr24h-Cap (Verapamil Hcl) .... Take 1 Tablet By Mouth Two Times A Day 6)  Diazepam 2 Mg Tabs (Diazepam) .... Take 1 Tablet By Mouth Once A Day As Needed For Nausea and Dizziness. 7)  Plavix 75 Mg Tabs (Clopidogrel Bisulfate) .... Take One Tablet By Mouth Oncea  Day 8)  Medium Grade Compression Stocking. Bilateral. .... Dx: Venous Stasis. Has Normal Abi.  Knee High Stockings Please. 9)  Levothroid 50 Mcg Tabs (Levothyroxine Sodium) .... Take 1 Tablet By Mouth Once A Day 10)  Compression Stockings. .... Below The Knee, Moderate Grade Compression Stockings.   Dx. Lower Extremity Edema. 11)  Ferrous Sulfate 325 (65 Fe) Mg Tbec (Ferrous Sulfate)  Allergies  (verified): 1)  ! Asa 2)  ! Lipitor 3)  ! * Simvastatin 4)  ! * Welchol  Comments:  Nurse/Medical Assistant: The patient's medications and allergies were reviewed with the patient and were updated in the Medication and Allergy Lists. Isaias Cowman CMA, Deborra Medina) (January 19, 2011 11:14 AM)  Past History:  Past Medical History: Last updated: 04/22/2009 Bilateral cataracts removed. Hx peptic ulcer dz dx by endoscopy 1992 Stress Test 2007- neg per pt .  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 Married to [employer].  Former Smoker Alcohol use-no Drug use-no Regular exercise-no  Family History: HTN in  2 Brothers with CAD, stents, one brother with MI age 62 and now has pacemaker and defibrillator.  Bothers were smokers.  Father died Heart dz age 75 Sister wtih heart dz.   Physical Exam  General:  Well-developed,well-nourished,in no acute distress; alert,appropriate and cooperative throughout examination Neck:  No deformities, masses, or tenderness noted. No TM.  Lungs:  Normal respiratory effort, chest expands symmetrically. Lungs are clear to auscultation, no crackles or wheezes. Heart:  Normal rate and regular rhythm. S1 and S2 normal without gallop, murmur, click, rub or other extra  sounds. NO carotid bruits.  Skin:  no rashes.   Cervical Nodes:  No lymphadenopathy noted Psych:  Cognition and judgment appear intact. Alert and cooperative with normal attention span and concentration. 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 They may even consider a Cath with his strong family hx.   Will do spirometry today to rule out other pulmonary cuase. Had normal CXR in the hospital nad he denies any cough, etc.  His updated medication list for this problem includes:    Diovan 40 Mg Tabs (Valsartan)  .Marland Kitchen... Take 1/2 tablet by mouth once a day    Verelan 120 Mg Xr24h-cap (Verapamil hcl) .Marland Kitchen... Take 1 tablet by mouth two times a day    Plavix 75 Mg Tabs (Clopidogrel bisulfate) .Marland Kitchen... Take one tablet by mouth oncea  day  Problem # 2:  UNSPECIFIED HYPOTHYROIDISM (ICD-244.9)  Due to recheck.  His updated medication list for this problem includes:    Levothroid 50 Mcg Tabs (Levothyroxine sodium) .Marland Kitchen... Take 1 tablet by mouth once a day  Labs Reviewed: TSH: 1.146 (06/09/2010)    Chol: 225 (10/14/2010)   HDL: 45 (10/14/2010)   LDL: 145 (10/14/2010)   TG: 174 (10/14/2010)  Orders: T-TSH [id number])  Problem # 3:  DYSPNEA (ICD-786.05)  His efforts was not the best, but he does have comprehension problems.   Spirometry shows FVC 87%, FEV1 65%, ratio of 60%.  Discussed starting spiriva and then then f/u in 2 months. Can repeat spirometry in 6 months. E fforst was fair but not best.   Orders: Spirometry (Pre & Post) [phone])  Complete Medication List: 1)  Meclizine Hcl 12.5 Mg Tabs (Meclizine hcl) .... Use as needed for dizziness 2)  Diovan 40 Mg Tabs (Valsartan) .... Take 1/2 tablet by mouth once a day 3)  Nabumetone 750 Mg Tabs (Nabumetone) .... Take 1/2 tablet by mouth once a day 4)  Niacin 500 Mg Tabs (Niacin) .... Take 3  a day. 5)  Verelan 120 Mg Xr24h-cap (Verapamil hcl) .... Take 1 tablet by mouth two times a day 6)  Diazepam 2 Mg Tabs (Diazepam) .... Take 1 tablet by mouth once a day as needed for nausea and dizziness. 7)  Plavix 75 Mg Tabs (Clopidogrel bisulfate) .... Take one tablet by mouth oncea  day 8)  Medium Grade Compression Stocking. Bilateral.  .... Dx: venous stasis. has normal abi.  knee high stockings please. 9)  Levothroid 50 Mcg Tabs (Levothyroxine sodium) .... Take 1 tablet by mouth once a day 10)  Compression Stockings.  .... Below the knee, moderate grade compression stockings.   dx. lower extremity edema. 11)  Ferrous Sulfate 325 (65 Fe) Mg Tbec (Ferrous  sulfate)   Orders Added: 1)  T-TSH [id number] 2)  Est. Patient Level IV [id number] 3)  Spirometry (Pre & Post) [id number]

## 2011-01-27 NOTE — Progress Notes (Signed)
Summary: pt's medicine  Phone Note Call from Patient   Caller: Patient Summary of Call: patient was seen yesterday and wants to know if you forgot to call in his med for COPD- Breathing? Call him and let him know the staus on this  [phone].Otho Ket  January 20, 2011 11:27 AM  Initial call taken by: Otho Ket,  January 20, 2011 11:28 AM  Follow-up for Phone Call        Sorry, yest. sent now.  Follow-up by: Beatrice Lecher MD,  January 20, 2011 11:32 AM  Additional Follow-up for Phone Call Additional follow up Details #1::        pt notifed Additional Follow-up by: Isaias Cowman CMA, Deborra Medina),  January 20, 2011 12:31 PM    New/Updated Medications: SPIRIVA HANDIHALER 18 MCG CAPS (TIOTROPIUM BROMIDE MONOHYDRATE) one cap inhaled once a day. Prescriptions: SPIRIVA HANDIHALER 18 MCG CAPS (TIOTROPIUM BROMIDE MONOHYDRATE) one cap inhaled once a day.  #30 x 2   Entered and Authorized by:   Beatrice Lecher MD   Signed by:   Beatrice Lecher MD on 01/20/2011   Method used:   Electronically to        [facility] [phone]* (retail)       [address] [place], [place]  [address]       Ph: [phone]       Fax: [phone]   [place]:   [phone]

## 2011-02-06 ENCOUNTER — Encounter: Payer: Self-pay | Admitting: Family Medicine

## 2011-02-16 ENCOUNTER — Ambulatory Visit: Payer: Self-pay | Admitting: Family Medicine

## 2011-02-19 ENCOUNTER — Ambulatory Visit: Payer: Self-pay

## 2011-02-19 ENCOUNTER — Telehealth: Payer: Self-pay | Admitting: *Deleted

## 2011-02-19 NOTE — Telephone Encounter (Signed)
Pt is still asking of neuro and nerve conduction study results.

## 2011-02-19 NOTE — Telephone Encounter (Signed)
Wrong chart

## 2011-02-24 ENCOUNTER — Ambulatory Visit: Payer: Self-pay | Admitting: Family Medicine

## 2011-02-24 ENCOUNTER — Other Ambulatory Visit: Payer: Self-pay | Admitting: Family Medicine

## 2011-02-24 NOTE — Telephone Encounter (Signed)
Pt has follow up appt on 03/09/11.

## 2011-02-27 ENCOUNTER — Other Ambulatory Visit: Payer: Self-pay | Admitting: Family Medicine

## 2011-02-27 ENCOUNTER — Telehealth: Payer: Self-pay | Admitting: Family Medicine

## 2011-02-27 NOTE — Telephone Encounter (Signed)
Call pt: Thyroid looks great.

## 2011-02-27 NOTE — Telephone Encounter (Signed)
Pt.notified

## 2011-03-01 ENCOUNTER — Telehealth: Payer: Self-pay | Admitting: Family Medicine

## 2011-03-01 NOTE — Telephone Encounter (Signed)
Thyroid looks well controlled. REcheck in 6 mo.

## 2011-03-02 NOTE — Telephone Encounter (Signed)
Pt's spouse notified.

## 2011-03-03 ENCOUNTER — Telehealth: Payer: Self-pay | Admitting: *Deleted

## 2011-03-03 DIAGNOSIS — I4891 Unspecified atrial fibrillation: Secondary | ICD-10-CM

## 2011-03-03 NOTE — Telephone Encounter (Signed)
Dr. Renda Rolls from [facility] cards called stating Pt has new Dx of A Fib and needs to have Pt INR checked early next week. Dr. Quillian Quince will start Pt on 5mg  coumadin tonight.

## 2011-03-04 DIAGNOSIS — I4891 Unspecified atrial fibrillation: Secondary | ICD-10-CM | POA: Insufficient documentation

## 2011-03-08 ENCOUNTER — Encounter: Payer: Self-pay | Admitting: Family Medicine

## 2011-03-09 ENCOUNTER — Ambulatory Visit: Payer: Self-pay | Admitting: Family Medicine

## 2011-03-09 ENCOUNTER — Encounter: Payer: Self-pay | Admitting: Family Medicine

## 2011-03-09 ENCOUNTER — Ambulatory Visit (INDEPENDENT_AMBULATORY_CARE_PROVIDER_SITE_OTHER): Payer: Medicare Other | Admitting: Family Medicine

## 2011-03-09 VITALS — BP 152/76 | HR 100 | Ht 66.0 in | Wt 168.0 lb

## 2011-03-09 DIAGNOSIS — R0602 Shortness of breath: Secondary | ICD-10-CM

## 2011-03-09 DIAGNOSIS — I2699 Other pulmonary embolism without acute cor pulmonale: Secondary | ICD-10-CM

## 2011-03-09 DIAGNOSIS — I4891 Unspecified atrial fibrillation: Secondary | ICD-10-CM

## 2011-03-09 DIAGNOSIS — I10 Essential (primary) hypertension: Secondary | ICD-10-CM

## 2011-03-09 LAB — POCT INR: INR: 1.8

## 2011-03-09 NOTE — Assessment & Plan Note (Signed)
He didn't take his BP meds this AM so difficult to tell how well it it working. Discussed importance of taking his BP meds regularly.

## 2011-03-09 NOTE — Progress Notes (Signed)
  Subjective:    Patient ID: Roberto Fields, male    DOB: 03/24/1946, 65 y.o.   MRN: [id number]  HPI  Recently dx with af-fib. He was having a severe "dizzy spell" . He  Has a hx  Of dizziness and went to the ED and dx with af-fib. He is here today for his first coumadin check. He was starting on 5mg  daily.  Plavix was d/c and he continues a 27m ASA daily.   They have lots of questions about a-fib. Seeing cardiology in about 3 weeks.  No CP or palps.  No more dizzy spells.    Didn't take BP pills today. He says he sometimes takes a half and sometimes takes a whole.Last time he was here his BP was low.    Started on spiriva at the last visit for COPD.  Pt says he hasn't noticed any difference in his SOB. He says he is uing it daily. NO SE.   Review of Systems     Objective:   Physical Exam  Constitutional: He is oriented to person, place, and time. He appears well-developed and well-nourished.  HENT:  Head: Normocephalic and atraumatic.  Eyes: Pupils are equal, round, and reactive to light.  Neck:       No carotid bruits.   Cardiovascular: Normal rate and regular rhythm.   Murmur heard.      2/6 SEM  Pulmonary/Chest: Effort normal and breath sounds normal.  Neurological: He is alert and oriented to person, place, and time.  Skin: Skin is warm and dry.  Psychiatric: He has a normal mood and affect.          Assessment & Plan:

## 2011-03-09 NOTE — Assessment & Plan Note (Signed)
Doing well on Spiriva.  F/U in 2 months and rpeeat spirometry at that time.

## 2011-03-09 NOTE — Assessment & Plan Note (Addendum)
Here for coumadin check. See coumadin visit for adjustment to his meds. Discussed appropriate diet and the importance of checking his INR frequently.  He ist oleraing his meds well. No bleeding.  Also discussed the importance of taking his BP meds very regularly and of controlling his BP.

## 2011-03-10 ENCOUNTER — Other Ambulatory Visit: Payer: Self-pay | Admitting: Family Medicine

## 2011-03-16 ENCOUNTER — Encounter: Payer: Self-pay | Admitting: Family Medicine

## 2011-03-16 ENCOUNTER — Ambulatory Visit (INDEPENDENT_AMBULATORY_CARE_PROVIDER_SITE_OTHER): Payer: Medicare Other | Admitting: Family Medicine

## 2011-03-16 VITALS — BP 137/77 | HR 69

## 2011-03-16 DIAGNOSIS — I4891 Unspecified atrial fibrillation: Secondary | ICD-10-CM

## 2011-03-16 LAB — POCT INR: INR: 3.1

## 2011-03-16 NOTE — Progress Notes (Signed)
  Subjective:    Patient ID: Roberto Fields, male    DOB: [birth year], 65 y.o.   MRN: [id number]  HPI   Pt denies chest pain, SOB, dizziness, or heart palpitations.  Taking meds as directed w/o problems.  Denies medication side effects.  5 min spent with pt.     Review of Systems     Objective:   Physical Exam        Assessment & Plan:

## 2011-03-18 ENCOUNTER — Other Ambulatory Visit: Payer: Self-pay | Admitting: *Deleted

## 2011-03-18 MED ORDER — FERROUS SULFATE 325 (65 FE) MG PO TABS: 325.0000 mg | ORAL_TABLET | Freq: Two times a day (BID) | ORAL | Status: DC

## 2011-03-23 ENCOUNTER — Ambulatory Visit (INDEPENDENT_AMBULATORY_CARE_PROVIDER_SITE_OTHER): Payer: Medicare Other | Admitting: Family Medicine

## 2011-03-23 VITALS — BP 143/73 | HR 96 | Wt 169.0 lb

## 2011-03-23 DIAGNOSIS — I2699 Other pulmonary embolism without acute cor pulmonale: Secondary | ICD-10-CM

## 2011-03-23 DIAGNOSIS — Z7901 Long term (current) use of anticoagulants: Secondary | ICD-10-CM

## 2011-03-23 DIAGNOSIS — Z5181 Encounter for therapeutic drug level monitoring: Secondary | ICD-10-CM

## 2011-03-30 ENCOUNTER — Ambulatory Visit (INDEPENDENT_AMBULATORY_CARE_PROVIDER_SITE_OTHER): Payer: Medicare Other | Admitting: Family Medicine

## 2011-03-30 VITALS — BP 142/74 | HR 74 | Wt 170.0 lb

## 2011-03-30 DIAGNOSIS — I2699 Other pulmonary embolism without acute cor pulmonale: Secondary | ICD-10-CM

## 2011-04-07 ENCOUNTER — Ambulatory Visit: Payer: Medicare Other

## 2011-04-10 HISTORY — PX: CORONARY STENT PLACEMENT: SHX1402

## 2011-04-16 ENCOUNTER — Other Ambulatory Visit: Payer: Self-pay | Admitting: Family Medicine

## 2011-04-21 ENCOUNTER — Other Ambulatory Visit: Payer: Self-pay | Admitting: Family Medicine

## 2011-05-11 ENCOUNTER — Ambulatory Visit (INDEPENDENT_AMBULATORY_CARE_PROVIDER_SITE_OTHER): Payer: Medicare Other | Admitting: Family Medicine

## 2011-05-11 ENCOUNTER — Encounter: Payer: Self-pay | Admitting: Family Medicine

## 2011-05-11 DIAGNOSIS — I209 Angina pectoris, unspecified: Secondary | ICD-10-CM

## 2011-05-11 DIAGNOSIS — I2510 Atherosclerotic heart disease of native coronary artery without angina pectoris: Secondary | ICD-10-CM

## 2011-05-11 DIAGNOSIS — J449 Chronic obstructive pulmonary disease, unspecified: Secondary | ICD-10-CM | POA: Insufficient documentation

## 2011-05-11 MED ORDER — NABUMETONE 750 MG PO TABS: 750.0000 mg | ORAL_TABLET | Freq: Every day | ORAL | Status: DC

## 2011-05-11 MED ORDER — ALBUTEROL SULFATE HFA 108 (90 BASE) MCG/ACT IN AERS: 2.0000 | INHALATION_SPRAY | Freq: Four times a day (QID) | RESPIRATORY_TRACT | Status: DC | PRN

## 2011-05-11 NOTE — Progress Notes (Signed)
  Subjective:    Patient ID: Roberto Fields, male    DOB: 12-Sep-1946, 65 y.o.   MRN: [id number]  HPI  Was having CPon 6/4. Went to [facility].  Had a cath and had a blockage in LAD.  A sent placed and had a mild heart attack.  He is still have some CP when he walks.  Was told it may constocondritis. They felt it was not his heart. Already on relafen. Gets the CP when he is walking.  Can work in the garden and doesn't hurt. NO SOB. Has been walking one mile a day.Taking his meds regularly. Has been usign his spiriva daily but says he doesn't feel any better on it. No SE.  Doesn't have a rescue inhaler.   After reviewing his records: Admitted 6/4, D/C 6/7 with drug eluting stent placed into LAD. 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  Cardiovascular: Normal rate, regular rhythm and normal heart sounds.   Pulmonary/Chest: Effort normal and breath sounds normal.  Neurological: He is alert.  Skin: Skin is warm and dry.          Assessment & Plan:

## 2011-05-11 NOTE — Assessment & Plan Note (Signed)
Will give rescue albuterol inhaler to use pre-exercise. He says he never really gets SOB per se.

## 2011-05-11 NOTE — Assessment & Plan Note (Signed)
Stil unclear etiology.  Will inc relafen back to whole tab to see if helps. Also will add albuerol pre-exercise (he is walking one mile a day) and see if that helps as well.

## 2011-05-11 NOTE — Assessment & Plan Note (Addendum)
On ASA, statin. Not on Betablocker. New dx.

## 2011-05-11 NOTE — Patient Instructions (Signed)
Increase relafen to whole tab daily. Use the Proair (or Ventolin) 2 puffs about 15 minutes before exercise.

## 2011-05-14 ENCOUNTER — Other Ambulatory Visit: Payer: Self-pay | Admitting: Family Medicine

## 2011-06-16 ENCOUNTER — Other Ambulatory Visit: Payer: Self-pay | Admitting: Family Medicine

## 2011-07-01 ENCOUNTER — Encounter: Payer: Self-pay | Admitting: Family Medicine

## 2011-07-14 ENCOUNTER — Encounter: Payer: Self-pay | Admitting: Family Medicine

## 2011-07-14 ENCOUNTER — Ambulatory Visit (INDEPENDENT_AMBULATORY_CARE_PROVIDER_SITE_OTHER): Payer: Medicare Other | Admitting: Family Medicine

## 2011-07-14 VITALS — BP 138/77 | HR 72 | Wt 170.0 lb

## 2011-07-14 DIAGNOSIS — I208 Other forms of angina pectoris: Secondary | ICD-10-CM

## 2011-07-14 DIAGNOSIS — Z23 Encounter for immunization: Secondary | ICD-10-CM

## 2011-07-14 DIAGNOSIS — R6889 Other general symptoms and signs: Secondary | ICD-10-CM

## 2011-07-14 DIAGNOSIS — I209 Angina pectoris, unspecified: Secondary | ICD-10-CM

## 2011-07-14 NOTE — Patient Instructions (Signed)
Can check with your insurance to see if they cover the pneumonia vaccine Also I recommend using nasal saline for your congestion in your throat to clear out your sinus cavities over the next 4 days and see if you feel better.  Your lungs are clear today.

## 2011-07-14 NOTE — Progress Notes (Signed)
  Subjective:    Patient ID: Roberto Fields, male    DOB: 07-26-1946, 65 y.o.   MRN: [id number]  HPI F/U chest pain - I reviewed the notes from cardiology and they do think his chest pain with walking could be from small vessel dz. His cards wanted him to try NTG but he says he didn't try it it because  He didn't " want to get hooked on anything".  He is still having chest pain. Now happening rest.  Happens almost every time with walking.    Congestion in his throat. No fever or SOB or GERD. No fever or URI sxs. No nasal congestion. Will have to cough to try to clear the pleghm.     Review of Systems     Objective:   Physical Exam  Constitutional: He is oriented to person, place, and time. He appears well-developed and well-nourished.  HENT:  Head: Normocephalic and atraumatic.  Right Ear: External ear normal.  Left Ear: External ear normal.  Nose: Nose normal.  Mouth/Throat: Oropharynx is clear and moist.       TMs and canals are clear. Scar on the RT TM.   Eyes: Conjunctivae and EOM are normal. Pupils are equal, round, and reactive to light.  Neck: Neck supple. No thyromegaly present.  Cardiovascular: Normal rate and normal heart sounds.   Pulmonary/Chest: Effort normal and breath sounds normal.  Lymphadenopathy:    He has no cervical adenopathy.  Neurological: He is alert and oriented to person, place, and time.  Skin: Skin is warm and dry.  Psychiatric: He has a normal mood and affect.          Assessment & Plan:  Angina - discussed her really need to try the NTG so that his cardiologist can decide if he wants him to try a long acting nitrate.   Thraot congestion - his symptoms are not consistent with a URI and he has a normal ear nose and throat exam today. I recommended trying some over-the-counter nasal saline to irrigate the sinuses and see if this makes a difference in his symptoms. Also consider GERD as the cause for his not complaining of any reflux currently.if he  doesn't imprvin in the next 1-2 week consider CXR for further eval.   He was given a flu vaccine today.

## 2011-07-16 ENCOUNTER — Ambulatory Visit (INDEPENDENT_AMBULATORY_CARE_PROVIDER_SITE_OTHER): Payer: Medicare Other | Admitting: Family Medicine

## 2011-07-16 DIAGNOSIS — Z23 Encounter for immunization: Secondary | ICD-10-CM

## 2011-07-16 MED ORDER — PNEUMOCOCCAL VAC POLYVALENT 25 MCG/0.5ML IJ INJ: 0.5000 mL | INJECTION | INTRAMUSCULAR | Status: AC

## 2011-07-16 NOTE — Progress Notes (Signed)
Here for pneumonia vaccine.

## 2011-09-29 ENCOUNTER — Other Ambulatory Visit: Payer: Self-pay | Admitting: Family Medicine

## 2011-10-14 ENCOUNTER — Other Ambulatory Visit: Payer: Self-pay | Admitting: Family Medicine

## 2011-11-25 ENCOUNTER — Other Ambulatory Visit: Payer: Self-pay | Admitting: Family Medicine

## 2011-12-21 ENCOUNTER — Encounter: Payer: Self-pay | Admitting: Family Medicine

## 2012-01-01 ENCOUNTER — Encounter: Payer: Self-pay | Admitting: *Deleted

## 2012-01-04 ENCOUNTER — Encounter: Payer: Self-pay | Admitting: Family Medicine

## 2012-01-04 ENCOUNTER — Ambulatory Visit (INDEPENDENT_AMBULATORY_CARE_PROVIDER_SITE_OTHER): Payer: Medicare Other | Admitting: Family Medicine

## 2012-01-04 VITALS — BP 131/75 | HR 73 | Ht 68.0 in | Wt 172.0 lb

## 2012-01-04 DIAGNOSIS — I10 Essential (primary) hypertension: Secondary | ICD-10-CM

## 2012-01-04 DIAGNOSIS — I2510 Atherosclerotic heart disease of native coronary artery without angina pectoris: Secondary | ICD-10-CM

## 2012-01-04 DIAGNOSIS — Z1331 Encounter for screening for depression: Secondary | ICD-10-CM

## 2012-01-04 DIAGNOSIS — J449 Chronic obstructive pulmonary disease, unspecified: Secondary | ICD-10-CM

## 2012-01-04 DIAGNOSIS — Z9181 History of falling: Secondary | ICD-10-CM

## 2012-01-04 DIAGNOSIS — K219 Gastro-esophageal reflux disease without esophagitis: Secondary | ICD-10-CM

## 2012-01-04 MED ORDER — GABAPENTIN (ONCE-DAILY) 300 MG PO TABS: 300.0000 mg | ORAL_TABLET | Freq: Two times a day (BID) | ORAL | Status: DC

## 2012-01-04 MED ORDER — LEVOTHYROXINE SODIUM 50 MCG PO TABS: 50.0000 ug | ORAL_TABLET | Freq: Every day | ORAL | Status: DC

## 2012-01-04 MED ORDER — GABAPENTIN 300 MG PO CAPS: 300.0000 mg | ORAL_CAPSULE | Freq: Two times a day (BID) | ORAL | Status: DC

## 2012-01-04 MED ORDER — NABUMETONE 750 MG PO TABS: 750.0000 mg | ORAL_TABLET | Freq: Every day | ORAL | Status: DC

## 2012-01-04 MED ORDER — VERAPAMIL HCL ER 120 MG PO CP24: 120.0000 mg | ORAL_CAPSULE | Freq: Two times a day (BID) | ORAL | Status: DC

## 2012-01-04 MED ORDER — NITROGLYCERIN 0.4 MG SL SUBL: 0.4000 mg | SUBLINGUAL_TABLET | SUBLINGUAL | Status: DC | PRN

## 2012-01-04 MED ORDER — OMEPRAZOLE 40 MG PO CPDR: 40.0000 mg | DELAYED_RELEASE_CAPSULE | Freq: Every day | ORAL | Status: DC

## 2012-01-04 NOTE — Progress Notes (Addendum)
  Subjective:    Patient ID: Roberto Fields, male    DOB: 01-Oct-1946, 66 y.o.   MRN: [id number]  HPI CAD- Had a stent placed end of Jan. Says the chest is still having pain but is overall better. He takes his nitroglycerin almost daily before exercise. He is still walking 1 mile a day. He was released for full activity from a cardiologist. He has followup with cardiology in 3 months.  COPD-he says he actually feels worse when he uses the Spiriva. He would like to consider something else. His wife also like him to have an updated spirometry test. He does admit to shortness of breath has actually been better since he had the stent placed in January.  Review of Systems     Objective:   Physical Exam  Constitutional: He is oriented to person, place, and time. He appears well-developed and well-nourished.  HENT:  Head: Normocephalic and atraumatic.  Cardiovascular: Normal rate, regular rhythm and normal heart sounds.   Pulmonary/Chest: Effort normal and breath sounds normal.  Neurological: He is alert and oriented to person, place, and time.  Skin: Skin is warm and dry.  Psychiatric: He has a normal mood and affect. His behavior is normal.          Assessment & Plan:  CAD - BP well controlled.  Continue current medications. His wife on a nightly prescriptions printed for most of his medications. We did that today.  Reflux-had previously been taking Tagamet instead was switched omeprazole. There was like a prescription for mail order. I discussed the importance of weaning down to every other day or every third day if he is feeling better as there is a risk of fractures with long-term use. Discussed this with him and his wife today.  COPD-I gave him samples of todorza pressair, instead of the Spiriva. We can schedule him for spirometry in about one month. We did see Philippa Chester a new medication better. If he doesn't we'll try to send it for his insurance to see what the coverage is compared to the  Spiriva.  Fall Assessment Took scoe of 2. Low risk  Depression Screening- PHQ-9 score of 4 (normal)

## 2012-01-06 LAB — LIPID PANEL
Cholesterol: 140 mg/dL (ref 0–200)
VLDL: 23 mg/dL (ref 0–40)

## 2012-01-06 LAB — COMPLETE METABOLIC PANEL WITH GFR
AST: 21 U/L (ref 0–37)
Albumin: 4.4 g/dL (ref 3.5–5.2)
BUN: 19 mg/dL (ref 6–23)
Calcium: 9.2 mg/dL (ref 8.4–10.5)
Chloride: 110 mEq/L (ref 96–112)
Glucose, Bld: 90 mg/dL (ref 70–99)
Potassium: 4.4 mEq/L (ref 3.5–5.3)
Sodium: 144 mEq/L (ref 135–145)
Total Protein: 6 g/dL (ref 6.0–8.3)

## 2012-01-07 ENCOUNTER — Encounter: Payer: Self-pay | Admitting: *Deleted

## 2012-01-11 ENCOUNTER — Ambulatory Visit: Payer: Medicare Other | Admitting: Family Medicine

## 2012-02-03 ENCOUNTER — Encounter: Payer: Self-pay | Admitting: Family Medicine

## 2012-02-09 ENCOUNTER — Telehealth: Payer: Self-pay | Admitting: *Deleted

## 2012-02-09 NOTE — Telephone Encounter (Signed)
Pharmacy needs a call in regards to pt's Nabumetone. The number they left was [phone].

## 2012-02-11 NOTE — Telephone Encounter (Signed)
OK. Let stop the relafen. Remove from med list. Call pt and see if he wants to try tramadol.

## 2012-02-11 NOTE — Telephone Encounter (Signed)
Called pharm and they wanted to know if it was ok to disp relafen and plavix since increased risk of bleeding

## 2012-02-12 NOTE — Telephone Encounter (Signed)
Pt's spouse notified.

## 2012-02-18 ENCOUNTER — Telehealth: Payer: Self-pay | Admitting: *Deleted

## 2012-02-18 MED ORDER — TRAMADOL HCL 50 MG PO TABS: 50.0000 mg | ORAL_TABLET | Freq: Two times a day (BID) | ORAL | Status: AC | PRN

## 2012-02-18 NOTE — Telephone Encounter (Signed)
Do you want 50mg  tabs and how often?

## 2012-02-18 NOTE — Telephone Encounter (Signed)
Yes, ok to fill for 90 days supply for primemail.  I wasn't aware they wanted a long term rx.

## 2012-02-18 NOTE — Telephone Encounter (Signed)
rx sent

## 2012-02-18 NOTE — Telephone Encounter (Signed)
Pt's wife called and states that they were waiting on the Tramadol to be sent to [facility]. I don't see where it has been sent. Please advise if this is ok to fill with dosage and if I can fill for 90 day supply and instruction.

## 2012-03-21 ENCOUNTER — Other Ambulatory Visit: Payer: Self-pay | Admitting: Family Medicine

## 2012-05-09 ENCOUNTER — Ambulatory Visit (INDEPENDENT_AMBULATORY_CARE_PROVIDER_SITE_OTHER): Payer: Medicare Other | Admitting: Family Medicine

## 2012-05-09 ENCOUNTER — Encounter: Payer: Self-pay | Admitting: Family Medicine

## 2012-05-09 VITALS — BP 114/60 | HR 64 | Ht 68.0 in | Wt 164.0 lb

## 2012-05-09 DIAGNOSIS — J449 Chronic obstructive pulmonary disease, unspecified: Secondary | ICD-10-CM

## 2012-05-09 DIAGNOSIS — I10 Essential (primary) hypertension: Secondary | ICD-10-CM

## 2012-05-09 DIAGNOSIS — E039 Hypothyroidism, unspecified: Secondary | ICD-10-CM

## 2012-05-09 MED ORDER — LOSARTAN POTASSIUM 50 MG PO TABS: 50.0000 mg | ORAL_TABLET | Freq: Every day | ORAL | Status: DC

## 2012-05-09 MED ORDER — AMBULATORY NON FORMULARY MEDICATION: Status: DC

## 2012-05-09 MED ORDER — LEVOTHYROXINE SODIUM 50 MCG PO TABS: 50.0000 ug | ORAL_TABLET | Freq: Every day | ORAL | Status: DC

## 2012-05-09 NOTE — Progress Notes (Signed)
  Subjective:    Patient ID: Roberto Fields, male    DOB: 1946-10-22, 66 y.o.   MRN: [id number]  HPI HTN- Dong well on BP meds. Wants to change the diovan bc of cost.  No CP or SOB. Recently had another stent placed.    COPD - Due for spirometry.  Tried the [place] but said it made him feel very tired.  HAsn't had to use his rescue albuterol.  He feels much better since he had a new stent placed.   Hypothyroidism-he recently had a TSH checked with one of his other providers. We did receive a copy of this it was normal. He denies any recent skin or hair changes. He denies any weight changes.   Review of Systems     Objective:   Physical Exam  Constitutional: He is oriented to person, place, and time. He appears well-developed and well-nourished.  HENT:  Head: Normocephalic and atraumatic.  Cardiovascular: Normal rate, regular rhythm and normal heart sounds.   Pulmonary/Chest: Effort normal and breath sounds normal.  Musculoskeletal: He exhibits no edema.  Neurological: He is alert and oriented to person, place, and time.  Skin: Skin is warm and dry.       Decreased hair growth on lower extremities. He also has some venous stasis dermatitis on the right outer lower leg.  Psychiatric: He has a normal mood and affect. His behavior is normal.          Assessment & Plan:  HTN- Will change to losartan for cost reasons. Will check BP in one month for BP check with nurse visit to make sure still well controlled. F/U in 4-6 months.    Discussed importance of shingles vaccine. Given rx today to use.   COPD- has spirometyry scheduled in about a week. He is much better since having stent placed.  This may have been the main cause of his shortness of breath. He did not tolerate Spiriva or tudorza. We will await the spirometry results to see if we need to try something else. Right now he seems to be doing very well and has been asymptomatic.  Hypothyroidism - will refill meds for 6 months. Lab  Results  Component Value Date   TSH 2.650 02/26/2011

## 2012-05-09 NOTE — Patient Instructions (Signed)
Follow up in one month for nurse blood pressure check.

## 2012-05-16 ENCOUNTER — Ambulatory Visit: Payer: Medicare Other | Admitting: Family Medicine

## 2012-05-23 ENCOUNTER — Encounter: Payer: Self-pay | Admitting: Family Medicine

## 2012-05-23 ENCOUNTER — Ambulatory Visit (INDEPENDENT_AMBULATORY_CARE_PROVIDER_SITE_OTHER): Payer: Medicare Other | Admitting: Family Medicine

## 2012-05-23 VITALS — BP 108/51 | HR 68 | Ht 68.0 in | Wt 165.0 lb

## 2012-05-23 DIAGNOSIS — J4489 Other specified chronic obstructive pulmonary disease: Secondary | ICD-10-CM

## 2012-05-23 DIAGNOSIS — I10 Essential (primary) hypertension: Secondary | ICD-10-CM

## 2012-05-23 DIAGNOSIS — J449 Chronic obstructive pulmonary disease, unspecified: Secondary | ICD-10-CM

## 2012-05-23 LAB — PULMONARY FUNCTION TEST

## 2012-05-23 MED ORDER — ALBUTEROL SULFATE (5 MG/ML) 0.5% IN NEBU: 2.5000 mg | INHALATION_SOLUTION | Freq: Once | RESPIRATORY_TRACT | Status: DC

## 2012-05-23 NOTE — Assessment & Plan Note (Signed)
His cardiologist stopped the verapamil and started atenolol.  Will recheck BP in one month. A little low today. Home BP running around 115. If he starts to have more frequent blood pressures under a systolic [id number] and we may need to decrease his losartan or his atenolol. Followup in one month.

## 2012-05-23 NOTE — Assessment & Plan Note (Signed)
Repeat  Roberto Fields 05/23/2012. FVC was under 1% predicted. FEV1 was 85% predicted. FEV1 ratio of 67%. This puts him into the stage I, mild category. We discussed his results today with his wife. At this point time I think it would be okay for him to just use a short acting dilator as needed. I did encourage him to take to date on the canister that he has a home to make sure that it is still within date. If not please call the office and we can call in a refill. At this point I think we can hold off on Spiriva or tudorza. He did not have any side effects with Spiriva but felt it was not helpful. This is most likely because his recent increase in shortness of breath was from his coronary artery disease.  I did recommend repeat spirometry in one year to keep an eye on his COPD. He is no longer smoking.

## 2012-05-23 NOTE — Progress Notes (Signed)
  Subjective:    Patient ID: Roberto Fields, male    DOB: 11-Jan-1946, 66 y.o.   MRN: [id number]  HPI Here for spirometry today. We have scheduled him for spirometry for his increasing shortness of breath. He says it's actually gotten much much better since he had his last stent placed about a month ago. I suspect that his shortness of breath was most likely from cardiac origin but I do want to recheck his spirometry since he does have a history of COPD and has been unable to tolerate Spiriva or tudorza.   Saw cardiology and they stopped his verapamil and started atenolol. Updated med list.   Review of Systems     Objective:   Physical Exam        Assessment & Plan:  COPD - Spirometry - Discussed the results.    HTN-

## 2012-06-01 ENCOUNTER — Encounter: Payer: Self-pay | Admitting: Family Medicine

## 2012-06-15 ENCOUNTER — Other Ambulatory Visit: Payer: Self-pay | Admitting: Family Medicine

## 2012-06-27 ENCOUNTER — Ambulatory Visit (INDEPENDENT_AMBULATORY_CARE_PROVIDER_SITE_OTHER): Payer: Medicare Other | Admitting: Family Medicine

## 2012-06-27 VITALS — BP 131/68 | HR 79

## 2012-06-27 DIAGNOSIS — I10 Essential (primary) hypertension: Secondary | ICD-10-CM

## 2012-06-27 NOTE — Progress Notes (Signed)
  Subjective:    Patient ID: Roberto Fields, male    DOB: 06-17-1946, 66 y.o.   MRN: [id number]  HPI    Pt denies chest pain, SOB, dizziness, or heart palpitations.  Taking meds as directed w/o problems.  Denies medication side effects.  5 min spent with pt.  Review of Systems     Objective:   Physical Exam        Assessment & Plan:  HTN- Well controlled. Keep regular f/ with me. If doesn't have one schedule in 3 months.  Beatrice Lecher, MD

## 2012-07-25 ENCOUNTER — Other Ambulatory Visit: Payer: Self-pay | Admitting: Family Medicine

## 2012-07-25 MED ORDER — LOSARTAN POTASSIUM 50 MG PO TABS: 50.0000 mg | ORAL_TABLET | Freq: Every day | ORAL | Status: DC

## 2012-08-10 ENCOUNTER — Ambulatory Visit (INDEPENDENT_AMBULATORY_CARE_PROVIDER_SITE_OTHER): Payer: Medicare Other | Admitting: Family Medicine

## 2012-08-10 DIAGNOSIS — Z23 Encounter for immunization: Secondary | ICD-10-CM

## 2012-08-10 DIAGNOSIS — Z298 Encounter for other specified prophylactic measures: Secondary | ICD-10-CM

## 2012-08-10 NOTE — Progress Notes (Signed)
Seasonal flu vaccine

## 2012-08-17 ENCOUNTER — Other Ambulatory Visit: Payer: Self-pay | Admitting: *Deleted

## 2012-08-17 MED ORDER — TRAMADOL HCL 50 MG PO TABS: 50.0000 mg | ORAL_TABLET | Freq: Two times a day (BID) | ORAL | Status: DC | PRN

## 2012-09-12 ENCOUNTER — Encounter: Payer: Self-pay | Admitting: Family Medicine

## 2012-09-12 ENCOUNTER — Ambulatory Visit (INDEPENDENT_AMBULATORY_CARE_PROVIDER_SITE_OTHER): Payer: Medicare Other | Admitting: Family Medicine

## 2012-09-12 VITALS — BP 145/69 | HR 51 | Ht 66.0 in | Wt 164.0 lb

## 2012-09-12 DIAGNOSIS — E039 Hypothyroidism, unspecified: Secondary | ICD-10-CM

## 2012-09-12 DIAGNOSIS — E785 Hyperlipidemia, unspecified: Secondary | ICD-10-CM

## 2012-09-12 DIAGNOSIS — I10 Essential (primary) hypertension: Secondary | ICD-10-CM

## 2012-09-12 NOTE — Progress Notes (Signed)
  Subjective:    Patient ID: Roberto Fields, male    DOB: 01/05/1946, 66 y.o.   MRN: [id number]  HPI HTN- No CP or SOB.  Hx of CAD, SOB with walking.  Does walk on his own some.    Hypothyroid -  Energy is fair.  Has been more sleepy later.  No skin or hair changes. Has noticed more itching.     Review of Systems     Objective:   Physical Exam  Constitutional: He is oriented to person, place, and time. He appears well-developed and well-nourished.  HENT:  Head: Normocephalic and atraumatic.  Cardiovascular: Normal rate, regular rhythm and normal heart sounds.        No carotids bruits.   Pulmonary/Chest: Effort normal and breath sounds normal.  Musculoskeletal: He exhibits no edema.  Neurological: He is alert and oriented to person, place, and time.  Skin: Skin is warm and dry.  Psychiatric: He has a normal mood and affect. His behavior is normal.          Assessment & Plan:  HTN - Uncontrolled.  Continue current regimen and recheck in 1 months.  Consider cardiac rehab. Had stent put in January.  F/U oin 4 months.  Check BMP today.   Hypothyroid - Recheck level today.    Hyperlipidemia - On current regimen.  Recheck in March at f/u.

## 2012-09-12 NOTE — Patient Instructions (Addendum)
Do a nurse visit in a couple of weeks for blood pressure

## 2012-09-13 ENCOUNTER — Telehealth: Payer: Self-pay | Admitting: *Deleted

## 2012-09-13 LAB — COMPLETE METABOLIC PANEL WITH GFR
ALT: 13 U/L (ref 0–53)
AST: 17 U/L (ref 0–37)
Albumin: 4.4 g/dL (ref 3.5–5.2)
CO2: 24 mEq/L (ref 19–32)
Calcium: 9.8 mg/dL (ref 8.4–10.5)
Chloride: 105 mEq/L (ref 96–112)
Creat: 1.4 mg/dL — ABNORMAL HIGH (ref 0.50–1.35)
GFR, Est African American: 59 mL/min — ABNORMAL LOW
Potassium: 5 mEq/L (ref 3.5–5.3)

## 2012-09-13 NOTE — Telephone Encounter (Signed)
Patient notified

## 2012-09-13 NOTE — Telephone Encounter (Signed)
Message copied by Domingo Mend on Tue Sep 13, 2012  4:33 PM ------      Message from: Beatrice Lecher D      Created: Tue Sep 13, 2012  7:09 AM       Call patient: Labs are stable.

## 2012-09-26 ENCOUNTER — Ambulatory Visit (INDEPENDENT_AMBULATORY_CARE_PROVIDER_SITE_OTHER): Payer: Medicare Other | Admitting: Family Medicine

## 2012-09-26 VITALS — BP 127/67 | HR 56

## 2012-09-26 DIAGNOSIS — I10 Essential (primary) hypertension: Secondary | ICD-10-CM

## 2012-09-26 NOTE — Progress Notes (Signed)
  Subjective:    Patient ID: Roberto Fields, male    DOB: July 26, 1946, 67 y.o.   MRN: [id number]  HPI    Review of Systems     Objective:   Physical Exam        Assessment & Plan:  HTn - Well controlled. Looks great. Keep f/u appt.  If doesn't have one then f/u in 3 months.  Beatrice Lecher, MD

## 2012-09-26 NOTE — Progress Notes (Signed)
  Subjective:    Patient ID: Roberto Fields, male    DOB: 10/26/1946, 66 y.o.   MRN: [id number]  HPI    Pt denies chest pain, SOB, dizziness, or heart palpitations.  Taking meds as directed w/o problems.  Denies medication side effects.  5 min spent with pt.  Review of Systems     Objective:   Physical Exam        Assessment & Plan:

## 2012-10-10 ENCOUNTER — Other Ambulatory Visit: Payer: Self-pay | Admitting: *Deleted

## 2012-10-10 MED ORDER — LEVOTHYROXINE SODIUM 50 MCG PO TABS: 50.0000 ug | ORAL_TABLET | Freq: Every day | ORAL | Status: DC

## 2012-10-12 ENCOUNTER — Other Ambulatory Visit: Payer: Self-pay | Admitting: *Deleted

## 2012-10-12 MED ORDER — OMEPRAZOLE 40 MG PO CPDR: 40.0000 mg | DELAYED_RELEASE_CAPSULE | Freq: Every day | ORAL | Status: DC

## 2012-11-14 ENCOUNTER — Ambulatory Visit (INDEPENDENT_AMBULATORY_CARE_PROVIDER_SITE_OTHER): Payer: Medicare Other | Admitting: Family Medicine

## 2012-11-14 ENCOUNTER — Encounter: Payer: Self-pay | Admitting: Family Medicine

## 2012-11-14 VITALS — BP 142/69 | HR 60 | Resp 18 | Wt 169.0 lb

## 2012-11-14 DIAGNOSIS — W19XXXA Unspecified fall, initial encounter: Secondary | ICD-10-CM

## 2012-11-14 DIAGNOSIS — I10 Essential (primary) hypertension: Secondary | ICD-10-CM

## 2012-11-14 DIAGNOSIS — R42 Dizziness and giddiness: Secondary | ICD-10-CM

## 2012-11-14 DIAGNOSIS — J3489 Other specified disorders of nose and nasal sinuses: Secondary | ICD-10-CM

## 2012-11-14 DIAGNOSIS — R2689 Other abnormalities of gait and mobility: Secondary | ICD-10-CM

## 2012-11-14 DIAGNOSIS — R29818 Other symptoms and signs involving the nervous system: Secondary | ICD-10-CM

## 2012-11-14 MED ORDER — AZELASTINE HCL 0.1 % NA SOLN: 1.0000 | Freq: Two times a day (BID) | NASAL | Status: DC

## 2012-11-14 NOTE — Progress Notes (Signed)
Subjective:    Patient ID: Roberto Fields, male    DOB: 05/25/1946, 67 y.o.   MRN: [id number]  HPI Roberto Fields to ED on 10/19/12 for vertigo. Not acutally admitted.  Did head CT and it was negative.  Has seen Dr. Harmon Pier since then. Has f/u in about 3 weeks.  Told he had vertigo and a nausea.  He is some better. Given a rx for vertigo that he says cost about $60, thinks it is helping.  Off the valium.    Fell on Friday.  HAd foot propped up on tub to apply lotion.  Lost his balance and then fell onto the side of the tub.  Has some bruises on the back of his head.  Has a skinned area on his back and left arm. Not limping.  No LOC.  No HA since then. No nausea.    Still feel like nose runs all the time.  It is aggravated sometimes when he eats or when he gets in and out of cold or warm temperatures. He says is constant. He denies any significant nasal congestion, pressure or fevers. This is been ongoing for several years. He has tried nasal saline rinses without any relief.   Review of Systems     Objective:   Physical Exam  Constitutional: He is oriented to person, place, and time. He appears well-developed and well-nourished.  HENT:  Head: Normocephalic and atraumatic.  Right Ear: External ear normal.  Left Ear: External ear normal.  Nose: Nose normal.  Mouth/Throat: Oropharynx is clear and moist.       TMs and canals are clear.   Eyes: Conjunctivae normal and EOM are normal. Pupils are equal, round, and reactive to light.  Neck: Neck supple. No thyromegaly present.  Cardiovascular: Normal rate, regular rhythm and normal heart sounds.   Pulmonary/Chest: Effort normal and breath sounds normal.  Lymphadenopathy:    He has no cervical adenopathy.  Neurological: He is alert and oriented to person, place, and time.  Skin: Skin is warm and dry.  Psychiatric: He has a normal mood and affect. His behavior is normal.   He does have excoriation of the skin on his left forearm and left lower back. He  has Band-Aids over both knees. There is a small amount of bloody serous drainage. No sign of surrounding erythema. He also has an abrasion on his left inner arm with significant bruising. He also has a scab over the left occiput that is healing well.       Assessment & Plan:  Vertigo - benign positional vertigo. Patient does seem to be improving overall. Continue with home exercises. He is also on a medication that he says costs $60 but seems to be helping. I asked his wife to call back with the name of this medication.  Recent fall-I. encouraged him to make sure that he sitting down when he is bending over and applying lotion the bathroom. He did have excoriation over his left forearm, left lower back and left inner arm. He did have a small scab over the left occiput. All seem to be healing well with no signs of erythema or cellulitis.  Hypertension-blood pressure is just slightly elevated today but is normally well controlled. We will monitor his followup visit.  Rhinorrhea-I explained that sometimes this can be triggered by eating, changes in temperature et Ronney Asters. Some people do seem to have excessive rhinorrhea. We discussed a trial of a nasal steroid spray. Sent a prescription to his pharmacy  but it may be too costly for him to try. I'm happy to provide samples once we get some in that we do not have any today.   Hx of stroke- ON plavix and ASA.

## 2012-11-28 ENCOUNTER — Other Ambulatory Visit: Payer: Self-pay | Admitting: Family Medicine

## 2012-11-28 DIAGNOSIS — E039 Hypothyroidism, unspecified: Secondary | ICD-10-CM

## 2012-11-28 DIAGNOSIS — E785 Hyperlipidemia, unspecified: Secondary | ICD-10-CM

## 2012-11-28 DIAGNOSIS — I10 Essential (primary) hypertension: Secondary | ICD-10-CM

## 2012-11-28 LAB — COMPLETE METABOLIC PANEL WITH GFR
ALT: 17 U/L (ref 0–53)
CO2: 26 mEq/L (ref 19–32)
Calcium: 9.5 mg/dL (ref 8.4–10.5)
Chloride: 109 mEq/L (ref 96–112)
Creat: 1.37 mg/dL — ABNORMAL HIGH (ref 0.50–1.35)
GFR, Est African American: 61 mL/min
GFR, Est Non African American: 53 mL/min — ABNORMAL LOW
Glucose, Bld: 95 mg/dL (ref 70–99)
Sodium: 142 mEq/L (ref 135–145)
Total Protein: 6.3 g/dL (ref 6.0–8.3)

## 2012-11-28 LAB — LIPID PANEL
HDL: 41 mg/dL (ref >39)
LDL Cholesterol: 104 mg/dL — ABNORMAL HIGH (ref 0–99)
Triglycerides: 155 mg/dL — ABNORMAL HIGH (ref <150)

## 2012-12-14 ENCOUNTER — Encounter: Payer: Self-pay | Admitting: Family Medicine

## 2012-12-14 DIAGNOSIS — R42 Dizziness and giddiness: Secondary | ICD-10-CM | POA: Insufficient documentation

## 2012-12-26 ENCOUNTER — Other Ambulatory Visit: Payer: Self-pay

## 2012-12-26 MED ORDER — TRAMADOL HCL 50 MG PO TABS: 50.0000 mg | ORAL_TABLET | Freq: Two times a day (BID) | ORAL | Status: DC | PRN

## 2013-01-09 ENCOUNTER — Ambulatory Visit (INDEPENDENT_AMBULATORY_CARE_PROVIDER_SITE_OTHER): Payer: Medicare Other | Admitting: Family Medicine

## 2013-01-09 ENCOUNTER — Encounter: Payer: Self-pay | Admitting: Family Medicine

## 2013-01-09 VITALS — BP 122/65 | HR 56 | Ht 67.0 in | Wt 158.0 lb

## 2013-01-09 DIAGNOSIS — I10 Essential (primary) hypertension: Secondary | ICD-10-CM

## 2013-01-09 DIAGNOSIS — H8110 Benign paroxysmal vertigo, unspecified ear: Secondary | ICD-10-CM

## 2013-01-09 MED ORDER — NITROGLYCERIN 0.4 MG SL SUBL: 0.4000 mg | SUBLINGUAL_TABLET | SUBLINGUAL | Status: DC | PRN

## 2013-01-09 MED ORDER — PRAVASTATIN SODIUM 10 MG PO TABS: 10.0000 mg | ORAL_TABLET | Freq: Every day | ORAL | Status: DC

## 2013-01-09 MED ORDER — LEVOTHYROXINE SODIUM 50 MCG PO TABS: 50.0000 ug | ORAL_TABLET | Freq: Every day | ORAL | Status: DC

## 2013-01-09 MED ORDER — TRAMADOL HCL 50 MG PO TABS: 50.0000 mg | ORAL_TABLET | Freq: Two times a day (BID) | ORAL | Status: DC | PRN

## 2013-01-09 MED ORDER — CLOPIDOGREL BISULFATE 75 MG PO TABS: 75.0000 mg | ORAL_TABLET | Freq: Every day | ORAL | Status: DC

## 2013-01-09 NOTE — Progress Notes (Signed)
  Subjective:    Patient ID: Roberto Fields, male    DOB: 1946-01-14, 67 y.o.   MRN: [id number]  HPI Vertigo - Some improvement. No falls since last time I saw him. Saw Dr. Kem Boroughs and he felt he hadn't had a stroke. Walked a mile yesterday and did well. No dizziness today.    HTN -  Pt denies chest pain, SOB, dizziness, or heart palpitations.  Taking meds as directed w/o problems.  Denies medication side effects.      Review of Systems     Objective:   Physical Exam  Constitutional: He is oriented to person, place, and time. He appears well-developed and well-nourished.  HENT:  Head: Normocephalic and atraumatic.  Neck: Neck supple. No thyromegaly present.  Cardiovascular: Normal rate, regular rhythm and normal heart sounds.   Pulmonary/Chest: Effort normal and breath sounds normal.  Musculoskeletal: He exhibits no edema.  Lymphadenopathy:    He has no cervical adenopathy.  Neurological: He is alert and oriented to person, place, and time.  Skin: Skin is warm and dry.  Psychiatric: He has a normal mood and affect. His behavior is normal.          Assessment & Plan:  Vertigo - Doing well overal but still having episodes of vertigo. For the most part he been able to do most of what he would like to do. If that it's usually just goes and lays down. We discussed potentially referring him to physical therapy for vertigo if he is interested. He is certainly welcome to call me any point he decides to do this. He does have some nausea medication at home for one episode that as well. Followup in 3 months.  HTN - Well controlled. Continue current regimen. Followup in 3 months.

## 2013-01-16 ENCOUNTER — Ambulatory Visit: Payer: Medicare Other | Admitting: Family Medicine

## 2013-04-17 ENCOUNTER — Ambulatory Visit (INDEPENDENT_AMBULATORY_CARE_PROVIDER_SITE_OTHER): Payer: Medicare Other

## 2013-04-17 ENCOUNTER — Encounter: Payer: Self-pay | Admitting: Family Medicine

## 2013-04-17 ENCOUNTER — Ambulatory Visit (INDEPENDENT_AMBULATORY_CARE_PROVIDER_SITE_OTHER): Payer: Medicare Other | Admitting: Family Medicine

## 2013-04-17 VITALS — BP 109/59 | HR 52 | Wt 168.0 lb

## 2013-04-17 DIAGNOSIS — M79609 Pain in unspecified limb: Secondary | ICD-10-CM

## 2013-04-17 DIAGNOSIS — L84 Corns and callosities: Secondary | ICD-10-CM

## 2013-04-17 DIAGNOSIS — M79674 Pain in right toe(s): Secondary | ICD-10-CM

## 2013-04-17 LAB — URIC ACID: Uric Acid, Serum: 8.1 mg/dL — ABNORMAL HIGH (ref 4.0–7.8)

## 2013-04-17 LAB — CBC
MCH: 33 pg (ref 26.0–34.0)
MCHC: 34.5 g/dL (ref 30.0–36.0)
Platelets: 160 10*3/uL (ref 150–400)

## 2013-04-17 MED ORDER — LEVOTHYROXINE SODIUM 50 MCG PO TABS: 50.0000 ug | ORAL_TABLET | Freq: Every day | ORAL | Status: DC

## 2013-04-17 NOTE — Progress Notes (Signed)
Subjective:    Patient ID: Roberto Fields, male    DOB: [birth year], 67 y.o.   MRN: [id number]  HPI ? gout in right great toe, callus at underside of right small toe both x 1 month .  We was swollen initially. Soaked it in epsom salt.  Has been using trmadol for pain.  Hurts to wwalk on it.  Was using a shovel a lot right before it happened but no acutall known trauma.  Has not tried any NSAIDs for pain relief. The swelling is actually much better than it was when this first started. No prior history of gout. No prior history of other autoimmune disorders.    Review of Systems BP 109/59  Pulse 52  Wt 168 lb (76.204 kg)  BMI 26.31 kg/m2    Allergies  Allergen Reactions  . Aspirin   . Atorvastatin     REACTION: Muscle weakness  . Colesevelam     REACTION: joint pain  . Simvastatin     REACTION: Myalgias    Past Medical History  Diagnosis Date  . Cataract     bilateral cataracts removed  . Ulcer 1992    HX peptic ulcer- DX: by endoscopy  . CAD (coronary artery disease)     Past Surgical History  Procedure Laterality Date  . Lumbar spine surgery  1980 and 1989  . Coronary stent placement  04/2011    [facility], [facility]    History   Social History  . Marital Status: Married    Spouse Name: N/A    Number of Children: N/A  . Years of Education: N/A   Occupational History  . Not on file.   Social History Main Topics  . Smoking status: Former Research scientist (life sciences)  . Smokeless tobacco: Not on file  . Alcohol Use: No  . Drug Use: No  . Sexually Active:    Other Topics Concern  . Not on file   Social History Narrative  . No narrative on file    Family History  Problem Relation Age of Onset  . Hypertension Other     family history of  . Heart disease Father   . Heart disease Sister   . Coronary artery disease Brother   . Heart attack Brother 36    pacemaker and defibrillator  . Coronary artery disease Brother     Outpatient Encounter Prescriptions as of  04/17/2013  Medication Sig Dispense Refill  . albuterol (PROVENTIL HFA;VENTOLIN HFA) 108 (90 BASE) MCG/ACT inhaler Inhale 2 puffs into the lungs every 6 (six) hours as needed.      Marland Kitchen aspirin 81 MG EC tablet Take 81 mg by mouth daily.        Marland Kitchen atenolol (TENORMIN) 50 MG tablet Take 50 mg by mouth daily.      Marland Kitchen azelastine (ASTELIN) 137 MCG/SPRAY nasal spray Place 1 spray into the nose 2 (two) times daily. Use in each nostril as directed  30 mL  0  . clopidogrel (PLAVIX) 75 MG tablet Take 1 tablet (75 mg total) by mouth daily.  90 tablet  3  . levothyroxine (SYNTHROID, LEVOTHROID) 50 MCG tablet Take 1 tablet (50 mcg total) by mouth daily.  90 tablet  1  . losartan (COZAAR) 50 MG tablet Take 1 tablet (50 mg total) by mouth daily.  90 tablet  1  . niacin (NIASPAN) 500 MG CR tablet Take 500 mg by mouth 3 (three) times daily.        Marland Kitchen  nitroGLYCERIN (NITROSTAT) 0.4 MG SL tablet Place 1 tablet (0.4 mg total) under the tongue every 5 (five) minutes as needed for chest pain.  90 tablet  3  . NON FORMULARY Medium grade compression stocking/ b/l- DX: Venous stasis. Has normal ABI. Knee high stockings please       . omeprazole (PRILOSEC) 40 MG capsule Take 1 capsule (40 mg total) by mouth daily.  90 capsule  1  . pravastatin (PRAVACHOL) 10 MG tablet Take 1 tablet (10 mg total) by mouth daily.  90 tablet  1  . traMADol (ULTRAM) 50 MG tablet Take 1 tablet (50 mg total) by mouth 2 (two) times daily as needed.  90 tablet  0  . [DISCONTINUED] levothyroxine (SYNTHROID, LEVOTHROID) 50 MCG tablet Take 1 tablet (50 mcg total) by mouth daily.  90 tablet  1  . gabapentin (NEURONTIN) 300 MG capsule Take 1 capsule (300 mg total) by mouth 2 (two) times daily.  180 capsule  1   Facility-Administered Encounter Medications as of 04/17/2013  Medication Dose Route Frequency Provider Last Rate Last Dose  . albuterol (PROVENTIL) (5 MG/ML) 0.5% nebulizer solution 2.5 mg  2.5 mg Nebulization Once [name], MD               Objective:   Physical Exam Right great toe is very tender over the MTP joint.  Nontender along the metatarsal. No significant swelling or redness. Tender with squeeze test across the toes. He did have a callus along the pad of the foot underneath the fifth toe. I did take a blade and removed the callus. Underneath it does appear that he actually has 3 corns.       Assessment & Plan:  Right great toe- will get xrays to r/o fracture since this did start after he had been shoveling a lot.Though also consider gout. We'll check a CBC uric acid and sedimentation rate as well. Explained that typically NSAIDs are the main treatment for gout if that is what it is. I do want to rule out fracture first disease never had a prior history of gout. Can use Tylenol as needed for pain relief until we get the results back.  Corn/callus is-discussed treatment. He can buy some the over-the-counter pads to apply to the corn and this may help. Also keeping the callus debrider the sodas less pressure on the area of the foot can help as well.

## 2013-04-18 ENCOUNTER — Other Ambulatory Visit: Payer: Self-pay | Admitting: Family Medicine

## 2013-04-18 MED ORDER — COLCHICINE 0.6 MG PO TABS: 0.6000 mg | ORAL_TABLET | Freq: Every day | ORAL | Status: DC | PRN

## 2013-04-25 ENCOUNTER — Other Ambulatory Visit: Payer: Self-pay | Admitting: *Deleted

## 2013-04-25 MED ORDER — TRAMADOL HCL 50 MG PO TABS
50.0000 mg | ORAL_TABLET | Freq: Two times a day (BID) | ORAL | Status: DC | PRN
Start: 2013-04-25 — End: 2013-12-18

## 2013-08-15 ENCOUNTER — Ambulatory Visit: Payer: Medicare Other | Admitting: Family Medicine

## 2013-08-16 ENCOUNTER — Encounter: Payer: Self-pay | Admitting: Family Medicine

## 2013-08-16 ENCOUNTER — Ambulatory Visit (INDEPENDENT_AMBULATORY_CARE_PROVIDER_SITE_OTHER): Payer: Medicare Other | Admitting: Family Medicine

## 2013-08-16 VITALS — BP 126/67 | HR 57 | Wt 171.0 lb

## 2013-08-16 DIAGNOSIS — I6350 Cerebral infarction due to unspecified occlusion or stenosis of unspecified cerebral artery: Secondary | ICD-10-CM

## 2013-08-16 DIAGNOSIS — I639 Cerebral infarction, unspecified: Secondary | ICD-10-CM

## 2013-08-16 DIAGNOSIS — I4891 Unspecified atrial fibrillation: Secondary | ICD-10-CM

## 2013-08-16 DIAGNOSIS — I2510 Atherosclerotic heart disease of native coronary artery without angina pectoris: Secondary | ICD-10-CM

## 2013-08-16 MED ORDER — METOPROLOL SUCCINATE ER 50 MG PO TB24: 50.0000 mg | ORAL_TABLET | Freq: Every day | ORAL | Status: DC

## 2013-08-16 NOTE — Progress Notes (Signed)
Subjective:    Patient ID: Roberto Fields, male    DOB: [birth year], 67 y.o.   MRN: [id number]  HPI He was experiencing nausea vomiting and dizzy spells and was seen at [facility] on 08/03/13. D/c on 08/05/13.  He was diagnosed with atrial fibrillation as well as acute cerebellar infarct. Stopped plavix and atenolol and put on xaralto and metoprolol.  No problems with easy bleeding or bruising since starting the Xarelto.  No black stools.  He is on baby ASA as well.  Feels like himself.  HR has been well controlled in the 60s at home and BPs have been well controlled. He has felt well and feels like he is back down to baseline. His nausea and vomiting have resolved. In dizzy spells are still intermittent but back to baseline.  Hypertension-no chest pain or shortness of breath. Taking medications as prescribed. His atenolol was changed to metoprolol. His heart rate has mostly been running in the 60s since the change in medication.  Coronary artery disease is stable. They did not see any changes with his heart. Review of Systems BP 126/67  Pulse 57  Wt 171 lb (77.565 kg)  BMI 26.78 kg/m2    Allergies  Allergen Reactions  . Aspirin   . Atorvastatin     REACTION: Muscle weakness  . Colesevelam     REACTION: joint pain  . Simvastatin     REACTION: Myalgias    Past Medical History  Diagnosis Date  . Cataract     bilateral cataracts removed  . Ulcer 1992    HX peptic ulcer- DX: by endoscopy  . CAD (coronary artery disease)     Past Surgical History  Procedure Laterality Date  . Lumbar spine surgery  1980 and 1989  . Coronary stent placement  04/2011    [facility], [facility]    History   Social History  . Marital Status: Married    Spouse Name: N/A    Number of Children: N/A  . Years of Education: N/A   Occupational History  . Not on file.   Social History Main Topics  . Smoking status: Former Research scientist (life sciences)  . Smokeless tobacco: Not on file  .  Alcohol Use: No  . Drug Use: No  . Sexual Activity:    Other Topics Concern  . Not on file   Social History Narrative  . No narrative on file    Family History  Problem Relation Age of Onset  . Hypertension Other     family history of  . Heart disease Father   . Heart disease Sister   . Coronary artery disease Brother   . Heart attack Brother 36    pacemaker and defibrillator  . Coronary artery disease Brother     Outpatient Encounter Prescriptions as of 08/16/2013  Medication Sig Dispense Refill  . albuterol (PROVENTIL HFA;VENTOLIN HFA) 108 (90 BASE) MCG/ACT inhaler Inhale 2 puffs into the lungs every 6 (six) hours as needed.      Marland Kitchen aspirin 81 MG EC tablet Take 81 mg by mouth daily.        Marland Kitchen azelastine (ASTELIN) 137 MCG/SPRAY nasal spray Place 1 spray into the nose 2 (two) times daily. Use in each nostril as directed  30 mL  0  . colchicine 0.6 MG tablet Take 1 tablet (0.6 mg total) by mouth daily as needed.  30 tablet  2  . levothyroxine (SYNTHROID, LEVOTHROID) 50 MCG tablet Take 1 tablet (  50 mcg total) by mouth daily.  90 tablet  1  . losartan (COZAAR) 50 MG tablet Take 1 tablet (50 mg total) by mouth daily.  90 tablet  1  . metoprolol succinate (TOPROL-XL) 50 MG 24 hr tablet Take 1 tablet (50 mg total) by mouth daily. Take with or immediately following a meal.  30 tablet  0  . niacin (NIASPAN) 500 MG CR tablet Take 500 mg by mouth 3 (three) times daily.        . nitroGLYCERIN (NITROSTAT) 0.4 MG SL tablet Place 1 tablet (0.4 mg total) under the tongue every 5 (five) minutes as needed for chest pain.  90 tablet  3  . NON FORMULARY Medium grade compression stocking/ b/l- DX: Venous stasis. Has normal ABI. Knee high stockings please       . omeprazole (PRILOSEC) 40 MG capsule Take 1 capsule (40 mg total) by mouth daily.  90 capsule  1  . pravastatin (PRAVACHOL) 10 MG tablet Take 1 tablet (10 mg total) by mouth daily.  90 tablet  1  . Rivaroxaban (XARELTO) 20 MG TABS tablet Take 20  mg by mouth daily with supper.      . traMADol (ULTRAM) 50 MG tablet Take 1 tablet (50 mg total) by mouth 2 (two) times daily as needed.  90 tablet  0  . [DISCONTINUED] metoprolol succinate (TOPROL-XL) 50 MG 24 hr tablet Take 50 mg by mouth daily. Take with or immediately following a meal.      . gabapentin (NEURONTIN) 300 MG capsule Take 1 capsule (300 mg total) by mouth 2 (two) times daily.  180 capsule  1  . [DISCONTINUED] atenolol (TENORMIN) 50 MG tablet Take 50 mg by mouth daily.      . [DISCONTINUED] clopidogrel (PLAVIX) 75 MG tablet Take 1 tablet (75 mg total) by mouth daily.  90 tablet  3   Facility-Administered Encounter Medications as of 08/16/2013  Medication Dose Route Frequency Provider Last Rate Last Dose  . albuterol (PROVENTIL) (5 MG/ML) 0.5% nebulizer solution 2.5 mg  2.5 mg Nebulization Once [name], MD              Objective:   Physical Exam  Constitutional: He is oriented to person, place, and time. He appears well-developed and well-nourished.  HENT:  Head: Normocephalic and atraumatic.  Neck: Neck supple. No thyromegaly present.  Cardiovascular: Normal rate, regular rhythm and normal heart sounds.   He is in regular rhythm today. No carotid bruits.  Pulmonary/Chest: Effort normal and breath sounds normal.  Lymphadenopathy:    He has no cervical adenopathy.  Neurological: He is alert and oriented to person, place, and time. He has normal reflexes. No cranial nerve deficit.  Rapid hand movements normal. Strength in upper and lower extremities is symmetric. Cranial nerves II through XII intact.  Skin: Skin is warm and dry.  Psychiatric: He has a normal mood and affect. His behavior is normal.          Assessment & Plan:  Cerebellar infarct- he is back to baseline is any problems. Still continuing a baby aspirin per hospitalist recommendations. I did warn him to stop immediately if he notices any dark stools or irritation of the stomach after taking  medication. He has a followup with Dr. Adalberto Cole., his neurologist in about 2 weeks.  Atrial fibrillation-on Pradaxa and baby aspirin. He is on metoprolol for rate control. He has a followup with his cardiologist the first week of November.   Hypertension-well-controlled.  Continue current regimen. Tolerating metoprolol well. Followup in 3 months. Note, he has had problems with bradycardia with Toprol in the past. We will need to monitor him carefully. He has had a couple days where his heart rate was in the 50s. This is okay as long as he is not symptomatic or if it's dropping lower than this. His wife will keep an eye on this.

## 2013-09-11 ENCOUNTER — Other Ambulatory Visit: Payer: Self-pay | Admitting: *Deleted

## 2013-09-11 ENCOUNTER — Telehealth: Payer: Self-pay | Admitting: Family Medicine

## 2013-09-11 MED ORDER — OMEPRAZOLE 40 MG PO CPDR: 40.0000 mg | DELAYED_RELEASE_CAPSULE | Freq: Every day | ORAL | Status: DC

## 2013-09-11 NOTE — Telephone Encounter (Signed)
Refill request

## 2013-10-16 ENCOUNTER — Ambulatory Visit (INDEPENDENT_AMBULATORY_CARE_PROVIDER_SITE_OTHER): Payer: Medicare Other | Admitting: Family Medicine

## 2013-10-16 ENCOUNTER — Encounter: Payer: Self-pay | Admitting: Family Medicine

## 2013-10-16 VITALS — BP 140/70 | HR 64 | Temp 96.6°F | Wt 171.0 lb

## 2013-10-16 DIAGNOSIS — I10 Essential (primary) hypertension: Secondary | ICD-10-CM

## 2013-10-16 MED ORDER — MECLIZINE HCL 25 MG PO TABS: 25.0000 mg | ORAL_TABLET | Freq: Three times a day (TID) | ORAL | Status: DC | PRN

## 2013-10-16 MED ORDER — LOSARTAN POTASSIUM 100 MG PO TABS: 100.0000 mg | ORAL_TABLET | Freq: Every day | ORAL | Status: DC

## 2013-10-16 MED ORDER — DIAZEPAM 2 MG PO TABS: 2.0000 mg | ORAL_TABLET | Freq: Every day | ORAL | Status: DC | PRN

## 2013-10-16 NOTE — Progress Notes (Signed)
   Subjective:    Patient ID: Roberto Fields, male    DOB: 1946-03-07, 67 y.o.   MRN: [id number]  HPI HTN - BP has been up and down since Friday.  Mostly in the afternoons.  So increased the losartan to 100mg .   Pt denies chest pain, SOB, dizziness, or heart palpitations.  Taking meds as directed w/o problems.  Denies medication side effects.  Amiodarone was recently admitted by his cardiologist for rate control.  Hx of stroke so his wife is very worried about his BPs being this high.     Review of Systems     Objective:   Physical Exam  Constitutional: He is oriented to person, place, and time. He appears well-developed and well-nourished.  HENT:  Head: Normocephalic and atraumatic.  Cardiovascular: Normal rate, regular rhythm and normal heart sounds.   Pulmonary/Chest: Effort normal and breath sounds normal.  Neurological: He is alert and oriented to person, place, and time.  Skin: Skin is warm and dry.  Psychiatric: He has a normal mood and affect. His behavior is normal.          Assessment & Plan:  Hypertension-unclear what may have triggered the elevation. He wasn't 100% sure if he was actually taking the metoprolol or not. He wasn't sure if his cardiologist Dr. Allyson Sabal had actually discontinued it and whether not he was taking it. As and when in the office and we're able to confirm the last office visit note from Dr. Samara Snide just to make sure that he is taking everything he needs to be taking. Certainly if he is missing one of his medications and easily explain why his blood pressures have been elevated her for last week. If not we'll consider increasing losartan to 100 mg.  Addendum-I. did get the notes from [place] [facility]. They did have him hold his metoprolol after adding in a row drawn temporarily for a month. I'm going to have him start a half a tab of the metoprolol until he sees the cardiologist back next week just to keep his blood pressures under good  control. We're also increasing his losartan 100 mg. I did send it to mail order. If his Creon just wants them cut back to 50 mg it can be easily split.

## 2013-10-16 NOTE — Patient Instructions (Signed)
Cut the metoprolol in half and take half a tab daily. If pulse dropping below 50 then stop the metoprolol Ok to take 2 of the 50mg  tabs of losartan daily until your mail-order comes in.

## 2013-11-20 ENCOUNTER — Ambulatory Visit: Payer: Medicare Other | Admitting: Family Medicine

## 2013-12-18 ENCOUNTER — Other Ambulatory Visit: Payer: Self-pay | Admitting: *Deleted

## 2013-12-18 MED ORDER — TRAMADOL HCL 50 MG PO TABS: 50.0000 mg | ORAL_TABLET | Freq: Two times a day (BID) | ORAL | Status: DC | PRN

## 2014-01-25 ENCOUNTER — Other Ambulatory Visit: Payer: Self-pay | Admitting: *Deleted

## 2014-01-25 MED ORDER — OMEPRAZOLE 40 MG PO CPDR: 40.0000 mg | DELAYED_RELEASE_CAPSULE | Freq: Every day | ORAL | Status: DC

## 2014-01-25 MED ORDER — LEVOTHYROXINE SODIUM 50 MCG PO TABS: 50.0000 ug | ORAL_TABLET | Freq: Every day | ORAL | Status: DC

## 2014-02-12 ENCOUNTER — Ambulatory Visit: Payer: Medicare Other | Admitting: Family Medicine

## 2014-02-14 ENCOUNTER — Ambulatory Visit (INDEPENDENT_AMBULATORY_CARE_PROVIDER_SITE_OTHER): Payer: Medicare Other | Admitting: Family Medicine

## 2014-02-14 ENCOUNTER — Encounter: Payer: Self-pay | Admitting: Family Medicine

## 2014-02-14 VITALS — BP 140/68 | HR 55 | Wt 174.0 lb

## 2014-02-14 DIAGNOSIS — R0982 Postnasal drip: Secondary | ICD-10-CM

## 2014-02-14 DIAGNOSIS — I129 Hypertensive chronic kidney disease with stage 1 through stage 4 chronic kidney disease, or unspecified chronic kidney disease: Secondary | ICD-10-CM

## 2014-02-14 DIAGNOSIS — Z23 Encounter for immunization: Secondary | ICD-10-CM

## 2014-02-14 DIAGNOSIS — N183 Chronic kidney disease, stage 3 unspecified: Secondary | ICD-10-CM

## 2014-02-14 DIAGNOSIS — R209 Unspecified disturbances of skin sensation: Secondary | ICD-10-CM

## 2014-02-14 DIAGNOSIS — E039 Hypothyroidism, unspecified: Secondary | ICD-10-CM

## 2014-02-14 DIAGNOSIS — I10 Essential (primary) hypertension: Secondary | ICD-10-CM

## 2014-02-14 DIAGNOSIS — R238 Other skin changes: Secondary | ICD-10-CM

## 2014-02-14 LAB — CBC
HEMATOCRIT: 38.6 % — AB (ref 39.0–52.0)
Hemoglobin: 13.1 g/dL (ref 13.0–17.0)
MCH: 33.3 pg (ref 26.0–34.0)
MCHC: 33.9 g/dL (ref 30.0–36.0)
MCV: 98.2 fL (ref 78.0–100.0)
PLATELETS: 149 10*3/uL — AB (ref 150–400)
RBC: 3.93 MIL/uL — AB (ref 4.22–5.81)
RDW: 14.9 % (ref 11.5–15.5)
WBC: 5.4 10*3/uL (ref 4.0–10.5)

## 2014-02-14 LAB — COMPLETE METABOLIC PANEL WITH GFR
ALK PHOS: 103 U/L (ref 39–117)
ALT: 23 U/L (ref 0–53)
AST: 19 U/L (ref 0–37)
Albumin: 4.1 g/dL (ref 3.5–5.2)
BUN: 23 mg/dL (ref 6–23)
CO2: 26 mEq/L (ref 19–32)
CREATININE: 1.69 mg/dL — AB (ref 0.50–1.35)
Calcium: 9.5 mg/dL (ref 8.4–10.5)
Chloride: 107 mEq/L (ref 96–112)
GFR, Est African American: 47 mL/min — ABNORMAL LOW
GFR, Est Non African American: 41 mL/min — ABNORMAL LOW
GLUCOSE: 97 mg/dL (ref 70–99)
POTASSIUM: 5.2 meq/L (ref 3.5–5.3)
Sodium: 140 mEq/L (ref 135–145)
Total Bilirubin: 0.9 mg/dL (ref 0.2–1.2)
Total Protein: 6.3 g/dL (ref 6.0–8.3)

## 2014-02-14 LAB — LIPID PANEL
Cholesterol: 133 mg/dL (ref 0–200)
HDL: 52 mg/dL (ref >39)
LDL Cholesterol: 63 mg/dL (ref 0–99)
Total CHOL/HDL Ratio: 2.6 Ratio
Triglycerides: 88 mg/dL (ref <150)
VLDL: 18 mg/dL (ref 0–40)

## 2014-02-14 LAB — FERRITIN: Ferritin: 56 ng/mL (ref 22–322)

## 2014-02-14 LAB — TSH: TSH: 8.136 u[IU]/mL — AB (ref 0.350–4.500)

## 2014-02-14 NOTE — Progress Notes (Signed)
   Subjective:    Patient ID: Roberto Fields, male    DOB: [birth year], 68 y.o.   MRN: [id number]  HPI Hypertension- Pt denies chest pain, SOB, dizziness, or heart palpitations.  Taking meds as directed w/o problems.  Denies medication side effects.  Not watching salt intake.  No on NSAIDs or decongestants.    Hypothyroidism- no major weight change.  No skin or hair changes.    Occ hands will turn blue.  No pain when happens.  Happens on the left hand. Doesn't happen to the feet.  Noticed it a couple of months ago.    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 Needs copy sent to Dr. Jimmie Molly, [facility].   Had a ST for a week or two.  Says some post nasal drip.  No ear pain. No cough or congestion.   Review of Systems     Objective:   Physical Exam  Constitutional: He is oriented to person, place, and time. He appears well-developed and well-nourished.  HENT:  Head: Normocephalic and atraumatic.  Eyes: Conjunctivae are normal. Pupils are equal, round, and reactive to light.  Neck: Neck supple. No thyromegaly present.  Cardiovascular: Normal rate, regular rhythm and normal heart sounds.   No carotid bruits.  Radial pulses 2+ bilaterally. Hands are cool to touch. Normal capillary refill.   Pulmonary/Chest: Effort normal and breath sounds normal.  Lymphadenopathy:    He has no cervical adenopathy.  Neurological: He is alert and oriented to person, place, and time.  Skin: Skin is warm and dry.  Psychiatric: He has a normal mood and affect. His behavior is normal.          Assessment & Plan:  HTN - not well controlled. Due for lipids and CMP. Watch salt in diet. Did take Robitussion last night. If repeat blood pressure is well-controlled then followup in 4 months. If not well controlled and may be secondary to cough cold medications and recommend followup in 4-6 weeks.  CKD 3-will check kidney function and send a copy to Dr. Andreas Ohm, his cardiologist.  Hypothyroidism-due for TSH as well.  Currently asymptomatic.  Allergic rhinitis - causing ST form post nasal drip. Can try plain claritin OTC w/out decongestants.    Blue dicoloration of the hands. I think this just may be hyperactive vasoconstriction of the hands. He says he constantly feels cold. This is probably from his blood thinners. An is probably getting some excess vasoconstriction especially in the left hand. He has a great radial pulse suggesting good blood flow to the extremity. He says just wheezing and moving his hands that makes it go away. Will check a CBC as well as ferritin to evaluate for anemia.  Tdap updated today.

## 2014-02-15 ENCOUNTER — Other Ambulatory Visit: Payer: Self-pay | Admitting: Family Medicine

## 2014-02-15 DIAGNOSIS — E039 Hypothyroidism, unspecified: Secondary | ICD-10-CM

## 2014-02-15 MED ORDER — LEVOTHYROXINE SODIUM 75 MCG PO TABS: 75.0000 ug | ORAL_TABLET | Freq: Every day | ORAL | Status: DC

## 2014-04-23 ENCOUNTER — Other Ambulatory Visit: Payer: Self-pay | Admitting: *Deleted

## 2014-04-23 MED ORDER — LOSARTAN POTASSIUM 100 MG PO TABS: 100.0000 mg | ORAL_TABLET | Freq: Every day | ORAL | Status: DC

## 2014-05-14 ENCOUNTER — Encounter: Payer: Self-pay | Admitting: Family Medicine

## 2014-05-14 ENCOUNTER — Ambulatory Visit (INDEPENDENT_AMBULATORY_CARE_PROVIDER_SITE_OTHER): Payer: Medicare Other | Admitting: Family Medicine

## 2014-05-14 VITALS — BP 122/61 | HR 65 | Wt 173.0 lb

## 2014-05-14 DIAGNOSIS — I10 Essential (primary) hypertension: Secondary | ICD-10-CM

## 2014-05-14 DIAGNOSIS — E039 Hypothyroidism, unspecified: Secondary | ICD-10-CM

## 2014-05-14 MED ORDER — TRAMADOL HCL 50 MG PO TABS: 50.0000 mg | ORAL_TABLET | Freq: Two times a day (BID) | ORAL | Status: DC | PRN

## 2014-05-14 MED ORDER — LEVOTHYROXINE SODIUM 75 MCG PO TABS: 75.0000 ug | ORAL_TABLET | Freq: Every day | ORAL | Status: DC

## 2014-05-14 MED ORDER — OMEPRAZOLE 40 MG PO CPDR: 40.0000 mg | DELAYED_RELEASE_CAPSULE | Freq: Every day | ORAL | Status: DC

## 2014-05-14 NOTE — Progress Notes (Signed)
   Subjective:    Patient ID: Roberto Fields, male    DOB: [birth year], 68 y.o.   MRN: [id number]  HPI Hypothyroid - Has been on new dose for almost 3 months.  Has not been cold but has been sweating more.  Takes on empty stomach before his eats. No fevers, chills.    Hypertension- Pt denies chest pain, SOB, dizziness, or heart palpitations.  Taking meds as directed w/o problems.  Denies medication side effects.     Review of Systems     Objective:   Physical Exam  Constitutional: He is oriented to person, place, and time. He appears well-developed and well-nourished.  HENT:  Head: Normocephalic and atraumatic.  Neck: Neck supple. No thyromegaly present.  Cardiovascular: Normal rate, regular rhythm and normal heart sounds.   Pulmonary/Chest: Effort normal and breath sounds normal.  Lymphadenopathy:    He has no cervical adenopathy.  Neurological: He is alert and oriented to person, place, and time.  Skin: Skin is warm and dry.  Psychiatric: He has a normal mood and affect. His behavior is normal.          Assessment & Plan:  Hypothyroid - recheck thyroid level. At goal then we'll send a new description for 90 supply to his mail order pharmacy. He is no longer having cold intolerance is actually having sweats now. Make sure that he's not now hyperthyroid. Her monitor did of the importance of taking it on an empty stomach for maximal disruption. Followup in 4-6 months.  HTN- well controlled.  Continue current regimen. Followup in 4-6 months.

## 2014-05-15 LAB — TSH: TSH: 3.13 u[IU]/mL (ref 0.350–4.500)

## 2014-07-24 ENCOUNTER — Other Ambulatory Visit: Payer: Self-pay | Admitting: *Deleted

## 2014-07-24 MED ORDER — LOSARTAN POTASSIUM 100 MG PO TABS: 100.0000 mg | ORAL_TABLET | Freq: Every day | ORAL | Status: DC

## 2014-07-25 ENCOUNTER — Ambulatory Visit (INDEPENDENT_AMBULATORY_CARE_PROVIDER_SITE_OTHER): Payer: Medicare Other | Admitting: Family Medicine

## 2014-07-25 ENCOUNTER — Encounter: Payer: Self-pay | Admitting: Family Medicine

## 2014-07-25 VITALS — BP 142/78 | HR 59 | Temp 97.6°F | Ht 68.0 in | Wt 173.0 lb

## 2014-07-25 DIAGNOSIS — N522 Drug-induced erectile dysfunction: Secondary | ICD-10-CM

## 2014-07-25 DIAGNOSIS — I4891 Unspecified atrial fibrillation: Secondary | ICD-10-CM

## 2014-07-25 DIAGNOSIS — N183 Chronic kidney disease, stage 3 unspecified: Secondary | ICD-10-CM

## 2014-07-25 DIAGNOSIS — N529 Male erectile dysfunction, unspecified: Secondary | ICD-10-CM

## 2014-07-25 DIAGNOSIS — I10 Essential (primary) hypertension: Secondary | ICD-10-CM

## 2014-07-25 DIAGNOSIS — Z23 Encounter for immunization: Secondary | ICD-10-CM

## 2014-07-25 MED ORDER — LOSARTAN POTASSIUM 100 MG PO TABS: 100.0000 mg | ORAL_TABLET | Freq: Every day | ORAL | Status: DC

## 2014-07-25 MED ORDER — TADALAFIL 20 MG PO TABS: 20.0000 mg | ORAL_TABLET | Freq: Every day | ORAL | Status: DC | PRN

## 2014-07-25 MED ORDER — RIVAROXABAN 20 MG PO TABS: 20.0000 mg | ORAL_TABLET | Freq: Every day | ORAL | Status: DC

## 2014-07-25 NOTE — Progress Notes (Signed)
   Subjective:    Patient ID: Roberto Fields, male    DOB: 31-May-1946, 68 y.o.   MRN: [id number]  Hypertension   Hypertension- Pt denies chest pain, SOB, dizziness, or heart palpitations.  Taking meds as directed w/o problems.  Denies medication side effects.  HOme BPs usually in the 120s.    Atrial fibrillation/hx of stroke-he is now on Xarelto 20 mg daily he wants and if we have any samples today. No recent chest pain or shortness of breath.  CKD 3-on recent change in urination.  No blood in the urine.   Lab Results  Component Value Date   CREATININE 1.69* 02/14/2014    Review of Systems     Objective:   Physical Exam  Constitutional: He is oriented to person, place, and time. He appears well-developed and well-nourished.  HENT:  Head: Normocephalic and atraumatic.  Cardiovascular: Normal rate, regular rhythm and normal heart sounds.   Pulmonary/Chest: Effort normal and breath sounds normal.  Neurological: He is alert and oriented to person, place, and time.  Skin: Skin is warm and dry.  Psychiatric: He has a normal mood and affect. His behavior is normal.          Assessment & Plan:  F/u in 4 months if repeat BP is normal.

## 2014-07-25 NOTE — Assessment & Plan Note (Signed)
Repeat blood pressure was at goal. Normally it looks a little bit better. We'll continue to monitor carefully. We'll see him back in 3-4 months.

## 2014-07-25 NOTE — Assessment & Plan Note (Signed)
We'll provide samples of Xarelto if we have them today. No recent chest pain or shortness of breath.

## 2014-07-25 NOTE — Assessment & Plan Note (Signed)
Has beeen able to walk more without exercise.  Not using his NTG as much.

## 2014-07-25 NOTE — Assessment & Plan Note (Signed)
He would like to try Viagra or Cialis. Discussed with him that it is okay to try one of these medications but that he absolutely cannot take his nitrates or nitroglycerin around that time. He does understand this. His wife is here for a discussion as well. I did have a coupon card for 3 tabs of the Cialis. Coupon card provided and prescription provided. If he tolerates it well without any chest pain or side effects then he can call for a new prescription. I did warn him that this is typically not covered by insurance.

## 2014-07-25 NOTE — Assessment & Plan Note (Signed)
Due to recheck kidney function today. Following every 3-4 months.

## 2014-07-26 LAB — BASIC METABOLIC PANEL WITH GFR
BUN: 24 mg/dL — AB (ref 6–23)
CHLORIDE: 108 meq/L (ref 96–112)
CO2: 24 meq/L (ref 19–32)
Calcium: 9.1 mg/dL (ref 8.4–10.5)
Creat: 1.82 mg/dL — ABNORMAL HIGH (ref 0.50–1.35)
GFR, Est African American: 43 mL/min — ABNORMAL LOW
GFR, Est Non African American: 37 mL/min — ABNORMAL LOW
Glucose, Bld: 91 mg/dL (ref 70–99)
Potassium: 5 mEq/L (ref 3.5–5.3)
Sodium: 142 mEq/L (ref 135–145)

## 2014-08-09 ENCOUNTER — Other Ambulatory Visit: Payer: Self-pay | Admitting: *Deleted

## 2014-08-09 DIAGNOSIS — E039 Hypothyroidism, unspecified: Secondary | ICD-10-CM

## 2014-08-09 MED ORDER — LEVOTHYROXINE SODIUM 75 MCG PO TABS: 75.0000 ug | ORAL_TABLET | Freq: Every day | ORAL | Status: DC

## 2014-09-19 LAB — BASIC METABOLIC PANEL
BUN: 20 mg/dL (ref 4–21)
Creatinine: 1.8 mg/dL — AB (ref 0.6–1.3)
GLUCOSE: 104 mg/dL
Potassium: 5.5 mmol/L — AB (ref 3.4–5.3)
Sodium: 145 mmol/L (ref 137–147)

## 2014-09-19 LAB — TSH: TSH: 4.52 u[IU]/mL (ref 0.41–5.90)

## 2014-09-19 LAB — LIPID PANEL: LDL CALC: 103 mg/dL

## 2014-09-19 LAB — HEPATIC FUNCTION PANEL
ALT: 28 U/L (ref 10–40)
AST: 26 U/L (ref 14–40)

## 2014-09-19 LAB — CMP14+LP+1AC+CBC/D/PLT+TSH
CHLORIDE, SERUM: 106
Calcium: 9.5 mg/dL

## 2014-10-24 ENCOUNTER — Encounter: Payer: Self-pay | Admitting: Family Medicine

## 2014-10-24 ENCOUNTER — Ambulatory Visit (INDEPENDENT_AMBULATORY_CARE_PROVIDER_SITE_OTHER): Payer: Medicare Other | Admitting: Family Medicine

## 2014-10-24 VITALS — BP 153/65 | HR 56 | Wt 174.0 lb

## 2014-10-24 DIAGNOSIS — I25118 Atherosclerotic heart disease of native coronary artery with other forms of angina pectoris: Secondary | ICD-10-CM

## 2014-10-24 DIAGNOSIS — N183 Chronic kidney disease, stage 3 unspecified: Secondary | ICD-10-CM

## 2014-10-24 DIAGNOSIS — E039 Hypothyroidism, unspecified: Secondary | ICD-10-CM

## 2014-10-24 DIAGNOSIS — I4891 Unspecified atrial fibrillation: Secondary | ICD-10-CM

## 2014-10-24 DIAGNOSIS — I10 Essential (primary) hypertension: Secondary | ICD-10-CM

## 2014-10-24 MED ORDER — LEVOTHYROXINE SODIUM 75 MCG PO TABS: 75.0000 ug | ORAL_TABLET | Freq: Every day | ORAL | Status: DC

## 2014-10-24 MED ORDER — AMLODIPINE BESYLATE 2.5 MG PO TABS: 2.5000 mg | ORAL_TABLET | Freq: Every day | ORAL | Status: DC

## 2014-10-24 MED ORDER — LEVOTHYROXINE SODIUM 88 MCG PO TABS: 88.0000 ug | ORAL_TABLET | Freq: Every day | ORAL | Status: DC

## 2014-10-24 MED ORDER — LOSARTAN POTASSIUM 100 MG PO TABS: 100.0000 mg | ORAL_TABLET | Freq: Every day | ORAL | Status: DC

## 2014-10-24 MED ORDER — TRAMADOL HCL 50 MG PO TABS: 50.0000 mg | ORAL_TABLET | Freq: Two times a day (BID) | ORAL | Status: DC | PRN

## 2014-10-24 MED ORDER — PRAVASTATIN SODIUM 10 MG PO TABS: 10.0000 mg | ORAL_TABLET | Freq: Every day | ORAL | Status: DC

## 2014-10-24 NOTE — Progress Notes (Signed)
   Subjective:    Patient ID: Roberto Fields, male    DOB: October 04, 1946, 68 y.o.   MRN: [id number]  HPI Hypertension- Pt denies chest pain, SOB, dizziness, or heart palpitations.  Taking meds as directed w/o problems.  Denies medication side effects.  He is on losartan for blood pressure control. Home blood pressure measurements running primarily from 122 to the mid 150s. Diastolic pressure running in the 60s and pulse running in the 50s primarily. He does request refills today on his thyroid and blood pressure medications and his cluster medication.  Hypothyroid - No skin or hair changes.  No changes in energy level.  No recent weight changes.    Coronary artery disease-he is currently on Xarelto, statin, ARB. Still getting intermittant CP.  He has angina with walking.  Saw Cardiology in [place].    Atrial fibrillation-his amiodarone was discontinued recently. He is now on Xarelto for blood thinning.  Review of Systems     Objective:   Physical Exam  Constitutional: He is oriented to person, place, and time. He appears well-developed and well-nourished.  HENT:  Head: Normocephalic and atraumatic.  Cardiovascular: Normal rate, regular rhythm and normal heart sounds.   Pulmonary/Chest: Effort normal and breath sounds normal.  Neurological: He is alert and oriented to person, place, and time.  Skin: Skin is warm and dry.  Psychiatric: He has a normal mood and affect. His behavior is normal.          Assessment & Plan:  HTN- Uncontrolled. Will add amlodipine 2.5 mg at night. Continue losartan the morning. Continue to monitor home blood pressures. If not coming down then please let me know.  Hypothyroid- due to check TSH. Symptoms are stable.   Afib-well controlled. In rhythm today. Continue Xarelto.  CAD- follows with cardiology every 6 months. He is on pravastatin. We'll work on maximizing blood pressure control.  CKD 3-monitoring carefully. He just had kidney function updated in  November. Explained to family that this will need to be checked at least every 6 months if not more often.

## 2014-10-24 NOTE — Patient Instructions (Signed)
Go to lab to recheck you thyroid in 6 weeks.

## 2014-11-07 ENCOUNTER — Other Ambulatory Visit: Payer: Self-pay

## 2014-11-07 MED ORDER — NITROGLYCERIN 0.4 MG SL SUBL: 0.4000 mg | SUBLINGUAL_TABLET | SUBLINGUAL | Status: DC | PRN

## 2014-11-23 ENCOUNTER — Telehealth: Payer: Self-pay

## 2014-11-23 MED ORDER — AMLODIPINE BESYLATE 2.5 MG PO TABS
2.5000 mg | ORAL_TABLET | Freq: Every day | ORAL | Status: DC
Start: 2014-11-23 — End: 2015-05-22

## 2014-11-23 NOTE — Telephone Encounter (Signed)
Roberto Fields's wife called and states his blood pressure has been around 130/65. He needs a refill on the amlodipine. Sent to pharmacy with 5 refills.

## 2014-12-27 LAB — TSH: TSH: 2.19 u[IU]/mL (ref 0.350–4.500)

## 2014-12-27 NOTE — Progress Notes (Signed)
Quick Note:  All labs are normal. ______ 

## 2014-12-31 ENCOUNTER — Telehealth: Payer: Self-pay | Admitting: *Deleted

## 2014-12-31 NOTE — Telephone Encounter (Signed)
[phone] (Home) [phone] (Mobile)  Left VM about results

## 2014-12-31 NOTE — Telephone Encounter (Signed)
-----   Message from [name], MD sent at 12/27/2014  9:11 AM EST ----- All labs are normal.

## 2015-01-18 ENCOUNTER — Encounter: Payer: Self-pay | Admitting: Family Medicine

## 2015-04-22 ENCOUNTER — Ambulatory Visit (INDEPENDENT_AMBULATORY_CARE_PROVIDER_SITE_OTHER): Payer: Medicare Other | Admitting: Family Medicine

## 2015-04-22 ENCOUNTER — Encounter: Payer: Self-pay | Admitting: Family Medicine

## 2015-04-22 VITALS — BP 125/60 | HR 64 | Ht 68.0 in | Wt 176.0 lb

## 2015-04-22 DIAGNOSIS — E039 Hypothyroidism, unspecified: Secondary | ICD-10-CM

## 2015-04-22 DIAGNOSIS — L299 Pruritus, unspecified: Secondary | ICD-10-CM

## 2015-04-22 DIAGNOSIS — I10 Essential (primary) hypertension: Secondary | ICD-10-CM | POA: Diagnosis not present

## 2015-04-22 DIAGNOSIS — L298 Other pruritus: Secondary | ICD-10-CM

## 2015-04-22 DIAGNOSIS — I25118 Atherosclerotic heart disease of native coronary artery with other forms of angina pectoris: Secondary | ICD-10-CM | POA: Diagnosis not present

## 2015-04-22 DIAGNOSIS — Z23 Encounter for immunization: Secondary | ICD-10-CM | POA: Diagnosis not present

## 2015-04-22 DIAGNOSIS — R232 Flushing: Secondary | ICD-10-CM

## 2015-04-22 DIAGNOSIS — J449 Chronic obstructive pulmonary disease, unspecified: Secondary | ICD-10-CM | POA: Diagnosis not present

## 2015-04-22 DIAGNOSIS — T50905A Adverse effect of unspecified drugs, medicaments and biological substances, initial encounter: Secondary | ICD-10-CM

## 2015-04-22 LAB — LIPID PANEL
CHOLESTEROL: 153 mg/dL (ref 0–200)
HDL: 57 mg/dL (ref >=40)
LDL CALC: 71 mg/dL (ref 0–99)
TRIGLYCERIDES: 124 mg/dL (ref <150)
Total CHOL/HDL Ratio: 2.7 Ratio
VLDL: 25 mg/dL (ref 0–40)

## 2015-04-22 LAB — COMPLETE METABOLIC PANEL WITH GFR
ALT: 20 U/L (ref 0–53)
AST: 24 U/L (ref 0–37)
Albumin: 4.2 g/dL (ref 3.5–5.2)
Alkaline Phosphatase: 97 U/L (ref 39–117)
BUN: 26 mg/dL — AB (ref 6–23)
CHLORIDE: 107 meq/L (ref 96–112)
CO2: 25 mEq/L (ref 19–32)
Calcium: 9.4 mg/dL (ref 8.4–10.5)
Creat: 1.83 mg/dL — ABNORMAL HIGH (ref 0.50–1.35)
GFR, Est African American: 42 mL/min — ABNORMAL LOW
GFR, Est Non African American: 36 mL/min — ABNORMAL LOW
Glucose, Bld: 95 mg/dL (ref 70–99)
POTASSIUM: 4.8 meq/L (ref 3.5–5.3)
Sodium: 141 mEq/L (ref 135–145)
Total Bilirubin: 0.7 mg/dL (ref 0.2–1.2)
Total Protein: 6.6 g/dL (ref 6.0–8.3)

## 2015-04-22 LAB — TSH: TSH: 0.3 u[IU]/mL — AB (ref 0.350–4.500)

## 2015-04-22 MED ORDER — TRAMADOL HCL 50 MG PO TABS: 50.0000 mg | ORAL_TABLET | Freq: Two times a day (BID) | ORAL | Status: DC | PRN

## 2015-04-22 MED ORDER — LEVOTHYROXINE SODIUM 88 MCG PO TABS: 88.0000 ug | ORAL_TABLET | Freq: Every day | ORAL | Status: DC

## 2015-04-22 MED ORDER — OMEPRAZOLE 40 MG PO CPDR: 40.0000 mg | DELAYED_RELEASE_CAPSULE | Freq: Every day | ORAL | Status: DC

## 2015-04-22 MED ORDER — LOSARTAN POTASSIUM 100 MG PO TABS: 100.0000 mg | ORAL_TABLET | Freq: Every day | ORAL | Status: DC

## 2015-04-22 MED ORDER — PRAVASTATIN SODIUM 10 MG PO TABS: 10.0000 mg | ORAL_TABLET | Freq: Every day | ORAL | Status: DC

## 2015-04-22 NOTE — Progress Notes (Signed)
   Subjective:    Patient ID: Roberto Fields, male    DOB: 06-27-1946, 69 y.o.   MRN: [id number]  HPI Hypertension- Pt denies chest pain, SOB, dizziness, or heart palpitations.  Taking meds as directed w/o problems.  Denies medication side effects. Brought in log of home blood pressures. Lowest blood pressure was 109/47 and highest blood pressure was 121/57. Pulse ranges in the mid 50s to low 60s.   CAD- No CP.  He is taking your ASA, ARB and Xarelto. He is now on low dose of prastatin due ot statin intolerance and on Niacin as well per cardiology.   COPD- Some SOB with walking.  He hasn't had ot use his albuterol.   Hypothyroid - No recent skin or hair changes.  No recent weight changes.  Occ gets head to toe itching and face will flush.  Can happen at anytime.  He does take Niacin.  Not related to being outside.  Happen almost every other day.    Review of Systems     Objective:   Physical Exam  Constitutional: He is oriented to person, place, and time. He appears well-developed and well-nourished.  HENT:  Head: Normocephalic and atraumatic.  Neck: Neck supple. No thyromegaly present.  Cardiovascular: Normal rate, regular rhythm and normal heart sounds.   Pulmonary/Chest: Effort normal and breath sounds normal.  Lymphadenopathy:    He has no cervical adenopathy.  Neurological: He is alert and oriented to person, place, and time.  Skin: Skin is warm and dry.  Psychiatric: He has a normal mood and affect. His behavior is normal.          Assessment & Plan:  HTN - Well controlled here and at home.  Continue current regimen. Follow-up in 6 months.  CAD - stable. Follows with cardiology regularly. I did encourage him to discuss the itching with Dr. Joylene Grapes. This could be her effect of the niacin. Explained that this is not uncommon and it's not a truly allergic reaction. He wanted a refill be okay to stop the medication for about 2 weeks just to see if it made a difference. I  think this would be perfectly fine. If he does feel better without it then encouraged him to call and let Dr. Joylene Grapes know and not just wait until the follow-up appointment.  COPD-stable. He has not had use any albuterol recently. He does get a little short of breath with walking.  Schedule spirometry in 3 months. Last was in 2013. Not current on medication.   Hypothyroidism-TSH was well-controlled in February. Asymptomatic.  Flushing/itching-could be secondary to the niacin. He doesn't seem to have any other triggers such as allergens are going outdoors.  Need ot send records of labs to [facility] cardiology.

## 2015-04-22 NOTE — Addendum Note (Signed)
Addended by: Darla Lesches T on: 04/22/2015 10:11 AM   Modules accepted: Orders

## 2015-05-22 ENCOUNTER — Other Ambulatory Visit: Payer: Self-pay | Admitting: Family Medicine

## 2015-05-28 ENCOUNTER — Other Ambulatory Visit: Payer: Self-pay | Admitting: *Deleted

## 2015-05-28 DIAGNOSIS — E039 Hypothyroidism, unspecified: Secondary | ICD-10-CM

## 2015-05-28 MED ORDER — LEVOTHYROXINE SODIUM 88 MCG PO TABS: 88.0000 ug | ORAL_TABLET | Freq: Every day | ORAL | Status: DC

## 2015-07-10 ENCOUNTER — Other Ambulatory Visit: Payer: Self-pay | Admitting: Family Medicine

## 2015-07-22 ENCOUNTER — Other Ambulatory Visit: Payer: Medicare Other | Admitting: Family Medicine

## 2015-07-29 ENCOUNTER — Encounter: Payer: Self-pay | Admitting: Family Medicine

## 2015-07-29 ENCOUNTER — Ambulatory Visit (INDEPENDENT_AMBULATORY_CARE_PROVIDER_SITE_OTHER): Payer: Medicare Other | Admitting: Family Medicine

## 2015-07-29 VITALS — BP 126/64 | HR 59 | Wt 174.0 lb

## 2015-07-29 DIAGNOSIS — N183 Chronic kidney disease, stage 3 unspecified: Secondary | ICD-10-CM

## 2015-07-29 DIAGNOSIS — I4891 Unspecified atrial fibrillation: Secondary | ICD-10-CM

## 2015-07-29 DIAGNOSIS — I10 Essential (primary) hypertension: Secondary | ICD-10-CM | POA: Diagnosis not present

## 2015-07-29 DIAGNOSIS — R42 Dizziness and giddiness: Secondary | ICD-10-CM | POA: Diagnosis not present

## 2015-07-29 DIAGNOSIS — E039 Hypothyroidism, unspecified: Secondary | ICD-10-CM | POA: Diagnosis not present

## 2015-07-29 DIAGNOSIS — Z23 Encounter for immunization: Secondary | ICD-10-CM

## 2015-07-29 NOTE — Patient Instructions (Signed)
GFR - The GFR (G-stages) follow the original CKD classification scheme (table 3): ?G1 ? GFR >90 mL/min per 1.73 m2 ?G2 ? GFR 60 to 89 mL/min per 1.73 m2 ?G3a ? GFR 45 to 59 mL/min per 1.73 m2 ?G3b ? GFR 30 to 44 mL/min per 1.73 m2 ?G4 ? GFR 15 to 29 mL/min per 1.73 m2 ?G5 ? GFR <15 mL/min per 1.73 m2 or treatment by dialysis

## 2015-07-29 NOTE — Progress Notes (Signed)
Subjective:    Patient ID: Roberto Fields, male    DOB: December 31, 1945, 69 y.o.   MRN: [id number]  HPI Here today for hospital follow-up.  He went to [facility] on 9/9 for dizziness and vertigo. He and his wife were worried about repeat stroke. Though he wasn't vomiting this time.  He was also having some headaches. He was RE on Xarelto and aspirin which she has been on since 2014 had not missed any doses. He is on this for proximal atrial fibrillation and coronary artery disease status post stent placement. He did have an MRI without contrast which was normal and was monitored on telemetry. He did have a low heart rate there but there were no significant problems correlating to his symptoms. They did do an echocardiogram as well as carotid Dopplers. Acidosis a bedside swallow study and check his lipids as well as his hemoglobin A1c. A1c was 5.8.  Hypothyroid - he has been taking aching 88 g daily. He was supposed to start cutting one of the pills in half one day a week but says he forgot that he has been taking one whole tab daily.  Atrial fibrillation-he's doing well most Xarelto. No chest pain or shortness of breath but did have the episode of dizziness and vertigo. He takes his medication regularly.  Review of Systems  BP 126/64 mmHg  Pulse 59  Wt 174 lb (78.926 kg)    Allergies  Allergen Reactions  . Aspirin   . Atorvastatin     REACTION: Muscle weakness  . Colesevelam     REACTION: joint pain  . Simvastatin     REACTION: Myalgias    Past Medical History  Diagnosis Date  . Cataract     bilateral cataracts removed  . Ulcer 1992    HX peptic ulcer- DX: by endoscopy  . CAD (coronary artery disease)     Past Surgical History  Procedure Laterality Date  . Lumbar spine surgery  1980 and 1989  . Coronary stent placement  04/2011    [facility], [facility]    Social History   Social History  . Marital Status: Married    Spouse Name: N/A  . Number of Children:  N/A  . Years of Education: N/A   Occupational History  . Not on file.   Social History Main Topics  . Smoking status: Former Research scientist (life sciences)  . Smokeless tobacco: Not on file  . Alcohol Use: No  . Drug Use: No  . Sexual Activity: Not on file   Other Topics Concern  . Not on file   Social History Narrative    Family History  Problem Relation Age of Onset  . Hypertension Other     family history of  . Heart disease Father   . Heart disease Sister   . Coronary artery disease Brother   . Heart attack Brother 36    pacemaker and defibrillator  . Coronary artery disease Brother     Outpatient Encounter Prescriptions as of 07/29/2015  Medication Sig  . amLODipine (NORVASC) 2.5 MG tablet TAKE ONE TABLET BY MOUTH ONCE DAILY  . aspirin 81 MG EC tablet Take 81 mg by mouth daily.    Marland Kitchen levothyroxine (SYNTHROID, LEVOTHROID) 88 MCG tablet Take 1 tablet (88 mcg total) by mouth daily.  Marland Kitchen losartan (COZAAR) 100 MG tablet TAKE 1 BY MOUTH DAILY  . meclizine (ANTIVERT) 25 MG tablet Take 1 tablet (25 mg total) by mouth 3 (three) times daily  as needed for dizziness.  . niacin (NIASPAN) 500 MG CR tablet Take 500 mg by mouth 3 (three) times daily.    . nitroGLYCERIN (NITROSTAT) 0.4 MG SL tablet Place 1 tablet (0.4 mg total) under the tongue every 5 (five) minutes as needed for chest pain.  Marland Kitchen omeprazole (PRILOSEC) 40 MG capsule Take 1 capsule (40 mg total) by mouth daily.  . pravastatin (PRAVACHOL) 10 MG tablet Take 1 tablet (10 mg total) by mouth daily.  . rivaroxaban (XARELTO) 20 MG TABS tablet Take 1 tablet (20 mg total) by mouth daily with supper.  . tadalafil (CIALIS) 20 MG tablet Take 1 tablet (20 mg total) by mouth daily as needed for erectile dysfunction.  . traMADol (ULTRAM) 50 MG tablet Take 1 tablet (50 mg total) by mouth 2 (two) times daily as needed.  . gabapentin (NEURONTIN) 300 MG capsule Take 300 mg by mouth 3 (three) times daily. And an extra tab as needed.  . [DISCONTINUED] colchicine 0.6  MG tablet Take 1 tablet (0.6 mg total) by mouth daily as needed.   No facility-administered encounter medications on file as of 07/29/2015.          Objective:   Physical Exam  Constitutional: He is oriented to person, place, and time. He appears well-developed and well-nourished.  HENT:  Head: Normocephalic and atraumatic.  Cardiovascular: Normal rate, regular rhythm and normal heart sounds.   Pulmonary/Chest: Effort normal and breath sounds normal.  Musculoskeletal: He exhibits no edema.  Neurological: He is alert and oriented to person, place, and time.  Skin: Skin is warm and dry.  Psychiatric: He has a normal mood and affect. His behavior is normal.          Assessment & Plan:  Vertigo - improved. He is on gabapenting per Dr. Geralynn Ochs and is doing OK.    CKD-3 - we did discuss that his kidney function has been in the CKD 3 range for at least the last 3 years. There has been somewhat of a job in the last year but he has been consistently running a creatinine around 1.8 over the last year. It did jump to 2.0 while in the hospital so we are going to recheck that function today. Had a long discussion with him and his wife today about what this means. Did do a urine microalbumin as well. May need to consider referral to nephrology depending on how quick he has progressed.  Hypothyroid- we'll recheck thyroid level. If the TSH is still a little too suppressed and will have him cut one of the tabs in half daily and then take a whole tab the other 6 days of the week and then recheck in 6-8 weeks.  Atrial fibrillation-continue Xarelto. Unfortunately he is just that his Medicare gap so I'm concerned that he may have some difficulty affording the medication. I gave his wife the phone number to call the company directly to see if he may qualify for any assistance through the drug company directly.  Flu shot given today.

## 2015-07-30 LAB — BASIC METABOLIC PANEL WITH GFR
BUN: 30 mg/dL — ABNORMAL HIGH (ref 7–25)
CALCIUM: 9.4 mg/dL (ref 8.6–10.3)
CO2: 26 mmol/L (ref 20–31)
CREATININE: 1.99 mg/dL — AB (ref 0.70–1.18)
Chloride: 109 mmol/L (ref 98–110)
GFR, EST AFRICAN AMERICAN: 38 mL/min — AB (ref >=60)
GFR, EST NON AFRICAN AMERICAN: 33 mL/min — AB (ref >=60)
Glucose, Bld: 81 mg/dL (ref 65–99)
Potassium: 4.7 mmol/L (ref 3.5–5.3)
SODIUM: 142 mmol/L (ref 135–146)

## 2015-07-30 LAB — TSH: TSH: 0.919 u[IU]/mL (ref 0.350–4.500)

## 2015-07-30 NOTE — Addendum Note (Signed)
Addended by: Beatrice Lecher D on: 07/30/2015 08:00 AM   Modules accepted: Orders

## 2015-08-26 LAB — CBC AND DIFFERENTIAL
HEMOGLOBIN: 12.4 g/dL — AB (ref 13.5–17.5)
Platelets: 189 10*3/uL (ref 150–399)
WBC: 5.6 10^3/mL

## 2015-08-26 LAB — BASIC METABOLIC PANEL
BUN: 26 mg/dL — AB (ref 4–21)
GLUCOSE: 89 mg/dL
Potassium: 5.2 mmol/L (ref 3.4–5.3)
SODIUM: 142 mmol/L (ref 137–147)

## 2015-08-26 LAB — HEPATIC FUNCTION PANEL
ALK PHOS: 99 U/L (ref 25–125)
ALT: 27 U/L (ref 10–40)
AST: 21 U/L (ref 14–40)
Bilirubin, Total: 0.6 mg/dL

## 2015-08-26 LAB — HEMOGLOBIN A1C: HEMOGLOBIN A1C: 5.5

## 2015-09-16 ENCOUNTER — Ambulatory Visit (INDEPENDENT_AMBULATORY_CARE_PROVIDER_SITE_OTHER): Payer: Medicare Other | Admitting: Family Medicine

## 2015-09-16 ENCOUNTER — Encounter: Payer: Self-pay | Admitting: Family Medicine

## 2015-09-16 VITALS — BP 132/53 | HR 51 | Ht 68.0 in | Wt 176.0 lb

## 2015-09-16 DIAGNOSIS — J449 Chronic obstructive pulmonary disease, unspecified: Secondary | ICD-10-CM | POA: Diagnosis not present

## 2015-09-16 DIAGNOSIS — J441 Chronic obstructive pulmonary disease with (acute) exacerbation: Secondary | ICD-10-CM

## 2015-09-16 DIAGNOSIS — R0789 Other chest pain: Secondary | ICD-10-CM | POA: Diagnosis not present

## 2015-09-16 MED ORDER — ALBUTEROL SULFATE (2.5 MG/3ML) 0.083% IN NEBU
2.5000 mg | INHALATION_SOLUTION | Freq: Once | RESPIRATORY_TRACT | Status: AC
  Administered 2015-09-16: 2.5 mg via RESPIRATORY_TRACT

## 2015-09-16 NOTE — Progress Notes (Signed)
   Subjective:    Patient ID: Roberto Fields, male    DOB: 12/04/1945, 69 y.o.   MRN: [id number]  HPI Here for follow-up COPD today-his last spirometry was in 2013 inside recommended he come back in today so that we could take a look at everything. He has not noticed any significant changes in breathing or shortness of breath. No wheezing.  He did have chest pain over the weekend while raking leaves.  Has been using his nitroglycerin.  Dr. Wyona Almas days feels like it is more his chest wall. Tylenol does seem to help. Not bothering him today. Pain is mostly been over the midsternal area.   Review of Systems     Objective:   Physical Exam  Constitutional: He is oriented to person, place, and time. He appears well-developed and well-nourished.  HENT:  Head: Normocephalic and atraumatic.  Cardiovascular: Normal rate, regular rhythm and normal heart sounds.   Pulmonary/Chest: Effort normal and breath sounds normal. He exhibits tenderness.  He's had some mild tenderness over the left upper chest wall near the sternum. Nontender on the right side of the chest.  Neurological: He is alert and oriented to person, place, and time.  Skin: Skin is warm and dry.  Psychiatric: He has a normal mood and affect. His behavior is normal.          Assessment & Plan:  COPD-overall his numbers are fairly stable except he has had a drop in his FVC from a previous of the 100% 3 years ago to 75%. His FEV1 is about the same and his ratio is actually about the same. Overall fairly stable over the last 3 years. Postbronchodilator test did not improve.  Atypical chest pain - gave reassurance.  He just had full work up last month and tylenol seems to improve his pain. Keep regular follow up with cardilogy. He does have some tenderness on the chest wall today.

## 2015-09-24 ENCOUNTER — Other Ambulatory Visit: Payer: Self-pay | Admitting: Family Medicine

## 2015-10-14 ENCOUNTER — Other Ambulatory Visit: Payer: Self-pay | Admitting: Family Medicine

## 2015-10-28 ENCOUNTER — Other Ambulatory Visit: Payer: Self-pay | Admitting: Family Medicine

## 2015-11-06 ENCOUNTER — Other Ambulatory Visit: Payer: Self-pay | Admitting: Family Medicine

## 2015-11-27 ENCOUNTER — Other Ambulatory Visit: Payer: Self-pay | Admitting: Family Medicine

## 2015-12-02 LAB — HEPATIC FUNCTION PANEL
ALK PHOS: 117 U/L (ref 25–125)
ALT: 22 U/L (ref 10–40)
AST: 22 U/L (ref 14–40)
BILIRUBIN, TOTAL: 0.3 mg/dL

## 2015-12-02 LAB — TSH: TSH: 3.07 u[IU]/mL (ref 0.41–5.90)

## 2015-12-02 LAB — BASIC METABOLIC PANEL
BUN: 28 mg/dL — AB (ref 4–21)
Creatinine: 1.9 mg/dL — AB (ref 0.6–1.3)
Glucose: 84 mg/dL
Potassium: 5.7 mmol/L — AB (ref 3.4–5.3)
Sodium: 144 mmol/L (ref 137–147)

## 2015-12-02 LAB — PRO B NATRIURETIC PEPTIDE: Pro B Natriuretic peptide (BNP): 110

## 2015-12-03 ENCOUNTER — Other Ambulatory Visit: Payer: Self-pay

## 2015-12-03 DIAGNOSIS — E039 Hypothyroidism, unspecified: Secondary | ICD-10-CM

## 2015-12-03 MED ORDER — LEVOTHYROXINE SODIUM 88 MCG PO TABS: 88.0000 ug | ORAL_TABLET | Freq: Every day | ORAL | Status: DC

## 2015-12-10 ENCOUNTER — Encounter: Payer: Self-pay | Admitting: Family Medicine

## 2015-12-25 ENCOUNTER — Other Ambulatory Visit: Payer: Self-pay | Admitting: Family Medicine

## 2016-01-01 ENCOUNTER — Ambulatory Visit (INDEPENDENT_AMBULATORY_CARE_PROVIDER_SITE_OTHER): Payer: Medicare Other | Admitting: Family Medicine

## 2016-01-01 ENCOUNTER — Encounter: Payer: Self-pay | Admitting: Family Medicine

## 2016-01-01 ENCOUNTER — Ambulatory Visit (INDEPENDENT_AMBULATORY_CARE_PROVIDER_SITE_OTHER): Payer: Medicare Other

## 2016-01-01 VITALS — BP 139/49 | HR 65 | Temp 98.7°F | Wt 176.0 lb

## 2016-01-01 DIAGNOSIS — R05 Cough: Secondary | ICD-10-CM

## 2016-01-01 DIAGNOSIS — J209 Acute bronchitis, unspecified: Secondary | ICD-10-CM

## 2016-01-01 DIAGNOSIS — J9811 Atelectasis: Secondary | ICD-10-CM

## 2016-01-01 DIAGNOSIS — R0602 Shortness of breath: Secondary | ICD-10-CM

## 2016-01-01 DIAGNOSIS — E039 Hypothyroidism, unspecified: Secondary | ICD-10-CM

## 2016-01-01 DIAGNOSIS — R059 Cough, unspecified: Secondary | ICD-10-CM

## 2016-01-01 MED ORDER — AZITHROMYCIN 250 MG PO TABS: ORAL_TABLET | ORAL | Status: AC

## 2016-01-01 MED ORDER — HYDROCODONE-HOMATROPINE 5-1.5 MG/5ML PO SYRP: 5.0000 mL | ORAL_SOLUTION | Freq: Every evening | ORAL | Status: DC | PRN

## 2016-01-01 MED ORDER — LOSARTAN POTASSIUM 100 MG PO TABS: ORAL_TABLET | ORAL | Status: DC

## 2016-01-01 MED ORDER — LEVOTHYROXINE SODIUM 88 MCG PO TABS: 88.0000 ug | ORAL_TABLET | Freq: Every day | ORAL | Status: DC

## 2016-01-01 NOTE — Progress Notes (Signed)
   Subjective:    Patient ID: Roberto Fields, male    DOB: 28-Nov-1945, 70 y.o.   MRN: [id number]  HPI   4 days of cough, nasal congestion. He says he just has felt really tired and fatigued. Last night he got very short of breath and noticed some wheezing. No fevers. But he has had some chills and some sweats. No sore throat. No ear pain. No GI symptoms. He is not currently taking anything over-the-counter.   Hx of stroke - doing well on 15mg  of xarelto. On lower dose CrCl.   Hypothyroidism-he needs a new prescription sent for his thyroid medication to [facility]. There are no longer using meal order.  Review of Systems     Objective:   Physical Exam  Constitutional: He is oriented to person, place, and time. He appears well-developed and well-nourished.  HENT:  Head: Normocephalic and atraumatic.  Right Ear: External ear normal.  Left Ear: External ear normal.  Nose: Nose normal.  Mouth/Throat: Oropharynx is clear and moist.  TMs and canals are clear.   Eyes: Conjunctivae and EOM are normal. Pupils are equal, round, and reactive to light.  Neck: Neck supple. No thyromegaly present.  Cardiovascular: Normal rate and normal heart sounds.   Pulmonary/Chest: Effort normal and breath sounds normal.  Lymphadenopathy:    He has no cervical adenopathy.  Neurological: He is alert and oriented to person, place, and time.  Skin: Skin is warm and dry.  Psychiatric: He has a normal mood and affect.          Assessment & Plan:  Acute bronchitis versus pneumonia-I do for some crackles in the right lung supply to get chest x-ray for further evaluation today. Hydrocodone cough syrup provided today. Make sure hydrating well.  History of stroke-continue lower dose Xarelto.  Both thyroidism-new prescription sent to [facility] for 90 day refill on levothyroxine.

## 2016-01-08 ENCOUNTER — Other Ambulatory Visit: Payer: Self-pay | Admitting: Family Medicine

## 2016-01-09 ENCOUNTER — Encounter: Payer: Self-pay | Admitting: Family Medicine

## 2016-01-09 ENCOUNTER — Ambulatory Visit (INDEPENDENT_AMBULATORY_CARE_PROVIDER_SITE_OTHER): Payer: Medicare Other | Admitting: Family Medicine

## 2016-01-09 VITALS — BP 140/70 | HR 49 | Temp 97.9°F | Wt 178.0 lb

## 2016-01-09 DIAGNOSIS — J441 Chronic obstructive pulmonary disease with (acute) exacerbation: Secondary | ICD-10-CM | POA: Diagnosis not present

## 2016-01-09 MED ORDER — NITROGLYCERIN 0.4 MG SL SUBL: 0.4000 mg | SUBLINGUAL_TABLET | SUBLINGUAL | Status: DC | PRN

## 2016-01-09 NOTE — Progress Notes (Signed)
   Subjective:    Patient ID: Roberto Fields, male    DOB: 01-Aug-1946, 70 y.o.   MRN: [id number]  HPI Here today to follow-up for lung recheck. I saw him for acute bronchitis a week ago. We did chest x-ray at that time for some abnormal chest sounds like he might have an early infiltrate at the right lower lobe of the lung. I sent over and antibiotic. We got a note from the pharmacy sending culture indicated with his amiodarone. I reviewed several other options all of which interacted with amiodarone side call the pharmacy to let them know to go ahead and fill the original prescription. He said they went back several times and were never able to actually get the antibiotic prescription so he has not taken anything. He actually says he is significantly better today. He feels like his appetite back. He no fevers or chills. He still has some cough but it's improving and no shortness of breath.   Review of Systems     Objective:   Physical Exam  Constitutional: He is oriented to person, place, and time. He appears well-developed and well-nourished.  HENT:  Head: Normocephalic and atraumatic.  Cardiovascular: Normal rate, regular rhythm and normal heart sounds.   Pulmonary/Chest: Effort normal and breath sounds normal.  Neurological: He is alert and oriented to person, place, and time.  Skin: Skin is warm and dry.  Psychiatric: He has a normal mood and affect. His behavior is normal.          Assessment & Plan:  COPD exacerbation with bronchitis-he is actually significantly better without the antibiotic. Suspect that this was most likely viral. Certainly if he starts to feel worse before he completely improves and give [place] a call back. Otherwise we will hold off on filling the antibiotic.

## 2016-01-16 ENCOUNTER — Other Ambulatory Visit: Payer: Self-pay | Admitting: Family Medicine

## 2016-01-16 DIAGNOSIS — I4891 Unspecified atrial fibrillation: Secondary | ICD-10-CM

## 2016-01-16 DIAGNOSIS — I25118 Atherosclerotic heart disease of native coronary artery with other forms of angina pectoris: Secondary | ICD-10-CM

## 2016-02-04 ENCOUNTER — Other Ambulatory Visit: Payer: Self-pay | Admitting: Family Medicine

## 2016-03-16 ENCOUNTER — Ambulatory Visit: Payer: Medicare Other | Admitting: Family Medicine

## 2016-03-23 ENCOUNTER — Ambulatory Visit (INDEPENDENT_AMBULATORY_CARE_PROVIDER_SITE_OTHER): Payer: Medicare Other | Admitting: Family Medicine

## 2016-03-23 ENCOUNTER — Encounter: Payer: Self-pay | Admitting: Family Medicine

## 2016-03-23 VITALS — BP 130/53 | HR 53 | Wt 176.0 lb

## 2016-03-23 DIAGNOSIS — I10 Essential (primary) hypertension: Secondary | ICD-10-CM | POA: Diagnosis not present

## 2016-03-23 DIAGNOSIS — N183 Chronic kidney disease, stage 3 unspecified: Secondary | ICD-10-CM

## 2016-03-23 DIAGNOSIS — I25118 Atherosclerotic heart disease of native coronary artery with other forms of angina pectoris: Secondary | ICD-10-CM

## 2016-03-23 DIAGNOSIS — H109 Unspecified conjunctivitis: Secondary | ICD-10-CM

## 2016-03-23 DIAGNOSIS — J449 Chronic obstructive pulmonary disease, unspecified: Secondary | ICD-10-CM | POA: Diagnosis not present

## 2016-03-23 DIAGNOSIS — E039 Hypothyroidism, unspecified: Secondary | ICD-10-CM | POA: Diagnosis not present

## 2016-03-23 MED ORDER — ALBUTEROL SULFATE HFA 108 (90 BASE) MCG/ACT IN AERS: 2.0000 | INHALATION_SPRAY | Freq: Four times a day (QID) | RESPIRATORY_TRACT | Status: DC | PRN

## 2016-03-23 NOTE — Progress Notes (Signed)
Subjective:     Patient ID: Roberto Fields, male   DOB: 1946/02/07, 70 y.o.   MRN: [id number]  HPI Hypertension- Pt denies chest pain, SOB, dizziness, or heart palpitations.  Taking meds as directed w/o problems.  Denies medication side effects.    COPD - Seen 2 months ago for COPD exacerbation. He said he's done well since then. He denies any recent flares or exacerbations.  CAD- no recent stress chest pain or short of breath. He is currently on his pravastatin and aspirin daily.  Hypothyroid  - last TSH was around 3 it was checked about 3 months ago. He's doing well on his current dose of levothyroxine.  His wife reports he has had some more frequent headaches over the last few weeks. He did get sunburned on his scalp and had one episode of dizziness and vomiting. He did see Dr. Eli Hose on Friday and he recommended just checking some lab work before considering another MRI for possible stroke. He has not had any new symptoms or weakness etc. since then.  Review of Systems     Objective:   Physical Exam  Constitutional: He is oriented to person, place, and time. He appears well-developed and well-nourished.  HENT:  Head: Normocephalic and atraumatic.  Right Ear: External ear normal.  Left Ear: External ear normal.  Nose: Nose normal.  Mouth/Throat: Oropharynx is clear and moist.  TMs and canals are clear. Some scar tissue on the right tympanic membrane.  Eyes: Conjunctivae and EOM are normal. Pupils are equal, round, and reactive to light.  Neck: Neck supple. No thyromegaly present.  Cardiovascular: Normal rate and normal heart sounds.   Pulmonary/Chest: Effort normal and breath sounds normal.  Lymphadenopathy:    He has no cervical adenopathy.  Neurological: He is alert and oriented to person, place, and time.  Skin: Skin is warm and dry.  Psychiatric: He has a normal mood and affect.    left eye appears irritated with some mild swelling of the upper and lower lid.        Assessment:     Hypertension    Plan:     HTN -  Well controlled. Continue current regimen. Follow up in  3 months. He does report that he just had some blood work done with Dr. Eli Hose on Friday. We'll keep an eye out for that report.  COPD-stable. I did not see an albuterol inhaler on his list and he does need to have one available. New prescription sent to the pharmacy. Next  Coronary artery disease-stable. No recent flares symptoms. He does have nitroglycerin glycerin home to use as needed. Continue with statin, aspirin.  Hypothyroidism-currently asymptomatic but will recheck thyroid level in about 3 months.    GERD-Taking it every other day.  Left eye conjunctivitis-he says the eye is not really bothersome as far as itching or irritation. That is certainly irritated this morning. He actually has an eye appointment later today. He has been using his drops for glaucoma and wonders if he might be getting some irritation from that. He will speak with the eye doctor today about it.

## 2016-03-24 ENCOUNTER — Other Ambulatory Visit: Payer: Self-pay | Admitting: Family Medicine

## 2016-03-24 ENCOUNTER — Telehealth: Payer: Self-pay | Admitting: Family Medicine

## 2016-03-24 DIAGNOSIS — G4485 Primary stabbing headache: Secondary | ICD-10-CM

## 2016-03-24 DIAGNOSIS — H539 Unspecified visual disturbance: Secondary | ICD-10-CM

## 2016-03-24 LAB — BASIC METABOLIC PANEL
BUN: 28 mg/dL — AB (ref 4–21)
CREATININE: 2.2 mg/dL — AB (ref 0.6–1.3)
Glucose: 92 mg/dL
Potassium: 5 mmol/L (ref 3.4–5.3)
SODIUM: 142 mmol/L (ref 137–147)

## 2016-03-24 NOTE — Telephone Encounter (Signed)
Please call patient's wife, I got the note from the eye doctor, Dr. Jodi Mourning. They want [place] to rule out giant cell arteritis. This is typically done with blood work at least initially. I will put in a lab slip and he can go down to the lab at his convenience. He does not need to fast.

## 2016-03-25 NOTE — Telephone Encounter (Signed)
Wife returned clinic call, advised of recommendation for lab work. Pt will go to the lab today or tomorrow for completion.

## 2016-03-25 NOTE — Telephone Encounter (Signed)
Left VM for wife Stanton Kidney) to return clinic call regarding eye dr report. Callback information provided.

## 2016-03-26 LAB — CBC WITH DIFFERENTIAL/PLATELET
BASOS ABS: 48 {cells}/uL (ref 0–200)
Basophils Relative: 1 %
EOS PCT: 2 %
Eosinophils Absolute: 96 cells/uL (ref 15–500)
HEMATOCRIT: 37.2 % — AB (ref 38.5–50.0)
HEMOGLOBIN: 11.7 g/dL — AB (ref 13.2–17.1)
LYMPHS ABS: 1248 {cells}/uL (ref 850–3900)
Lymphocytes Relative: 26 %
MCH: 32.7 pg (ref 27.0–33.0)
MCHC: 31.5 g/dL — ABNORMAL LOW (ref 32.0–36.0)
MCV: 103.9 fL — ABNORMAL HIGH (ref 80.0–100.0)
MONO ABS: 384 {cells}/uL (ref 200–950)
MPV: 11.2 fL (ref 7.5–12.5)
Monocytes Relative: 8 %
NEUTROS ABS: 3024 {cells}/uL (ref 1500–7800)
Neutrophils Relative %: 63 %
Platelets: 188 10*3/uL (ref 140–400)
RBC: 3.58 MIL/uL — ABNORMAL LOW (ref 4.20–5.80)
RDW: 14.2 % (ref 11.0–15.0)
WBC: 4.8 10*3/uL (ref 3.8–10.8)

## 2016-03-26 LAB — VITAMIN B12: Vitamin B-12: 327 pg/mL (ref 200–1100)

## 2016-03-26 LAB — C-REACTIVE PROTEIN

## 2016-03-26 LAB — SEDIMENTATION RATE: Sed Rate: 16 mm/hr (ref 0–20)

## 2016-04-08 ENCOUNTER — Other Ambulatory Visit: Payer: Self-pay | Admitting: Family Medicine

## 2016-04-17 ENCOUNTER — Encounter: Payer: Self-pay | Admitting: Family Medicine

## 2016-05-06 ENCOUNTER — Telehealth: Payer: Self-pay | Admitting: Cardiology

## 2016-05-06 NOTE — Telephone Encounter (Signed)
Received records from [facility] for appointment on 05/27/16 with Dr [name] in [place].  Records given to [facility] (medical records) for Dr Jacalyn Lefevre schedule on 05/27/16. lp

## 2016-05-13 ENCOUNTER — Other Ambulatory Visit: Payer: Self-pay | Admitting: Family Medicine

## 2016-05-15 ENCOUNTER — Other Ambulatory Visit: Payer: Self-pay | Admitting: Family Medicine

## 2016-05-21 NOTE — Progress Notes (Signed)
Cardiology Office Note    Date:  05/27/2016   ID:  Roberto Fields, DOB 10/08/1946, MRN [id number]  PCP:  Beatrice Lecher, MD  Cardiologist:  Kirk Ruths, MD   Chief Complaint  Patient presents with  . Coronary Artery Disease    New patient evaluation    History of Present Illness:  Roberto Fields is a 70 y.o. male for evaluation of CAD. Previously cared for at [facility]. Catheterization 2013 showed a 99% obtuse marginal and 70% PDA. Patient had PCI of his marginal. Note based on records his disease was predominantly distal and not amenable to revascularization. Carotid Doppler September 2014 showed 1-39% bilateral stenosis. Echocardiogram September 2014 showed normal LV systolic function, Mild left ventricular hypertrophy and mild aortic insufficiency. Patient also with history of atrial fibrillation. Patient does not have dyspnea on exertion, orthopnea, PND, pedal edema or syncope. He apparently has chest pain with walking 1 mile which has been chronic and unchanged. She does not have rest chest pain. His pain is in the left upper chest. His wife states his previous cardiologist felt it was not cardiac.    Past Medical History  Diagnosis Date  . Cataract     bilateral cataracts removed  . Ulcer 1992    HX peptic ulcer- DX: by endoscopy  . CAD (coronary artery disease)   . Hypertension   . Hyperlipidemia   . Hypothyroid   . COPD (chronic obstructive pulmonary disease) ([place])   . Renal insufficiency   . CVA (cerebral infarction)   . BPH (benign prostatic hyperplasia)     Past Surgical History  Procedure Laterality Date  . Lumbar spine surgery  1980 and 1989  . Coronary stent placement  04/2011    LAD, [facility]  . Hernia mesh removal    . Eye surgery    . Arm surgery    . Transurethral resection of prostate      Current Medications: Outpatient Prescriptions Prior to Visit  Medication Sig Dispense Refill  . albuterol (PROVENTIL  HFA;VENTOLIN HFA) 108 (90 Base) MCG/ACT inhaler Inhale 2 puffs into the lungs every 6 (six) hours as needed for wheezing. 18 g 4  . amiodarone (PACERONE) 200 MG tablet   2  . amLODipine (NORVASC) 2.5 MG tablet TAKE ONE TABLET BY MOUTH ONCE DAILY.NEED APPOINTMENT FOR FURTHER REFILLS* 30 tablet 0  . aspirin 81 MG EC tablet Take 81 mg by mouth daily.      Marland Kitchen gabapentin (NEURONTIN) 300 MG capsule   0  . levothyroxine (SYNTHROID, LEVOTHROID) 88 MCG tablet Take 1 tablet (88 mcg total) by mouth daily. Hold until patient calls. 90 tablet 1  . losartan (COZAAR) 100 MG tablet TAKE 1 BY MOUTH DAILY. Hold until patient calls. 90 tablet 1  . meclizine (ANTIVERT) 25 MG tablet Take 1 tablet (25 mg total) by mouth 3 (three) times daily as needed for dizziness. 30 tablet 2  . niacin (NIASPAN) 500 MG CR tablet Take 500 mg by mouth 3 (three) times daily.      . nitroGLYCERIN (NITROSTAT) 0.4 MG SL tablet Place 1 tablet (0.4 mg total) under the tongue every 5 (five) minutes as needed for chest pain. 90 tablet 3  . pravastatin (PRAVACHOL) 10 MG tablet TAKE ONE TABLET BY MOUTH ONCE DAILY 90 tablet 0  . Rivaroxaban (XARELTO) 15 MG TABS tablet Take 15 mg by mouth daily with supper.    . traMADol (ULTRAM) 50 MG tablet Take 1 tablet (50 mg  total) by mouth 2 (two) times daily as needed. 90 tablet 2  . omeprazole (PRILOSEC) 40 MG capsule Take 1 capsule (40 mg total) by mouth daily. 90 capsule 2  . tiZANidine (ZANAFLEX) 2 MG tablet Reported on 05/27/2016  0   No facility-administered medications prior to visit.     Allergies:   Aspirin; Atorvastatin; Colesevelam; Nsaids; and Simvastatin   Social History   Social History  . Marital Status: Married    Spouse Name: N/A  . Number of Children: 3  . Years of Education: N/A   Social History Main Topics  . Smoking status: Former Research scientist (life sciences)  . Smokeless tobacco: None  . Alcohol Use: No  . Drug Use: No  . Sexual Activity: Not Asked   Other Topics Concern  . None   Social  History Narrative     Family History:  The patient's family history includes Coronary artery disease in his brother and brother; Heart attack (age of onset: 19) in his brother; Heart disease in his father and sister; Hypertension in his other.   ROS:   Please see the history of present illness.    No weight loss, productive cough, hemoptysis, dysphagia, odynophagia, melena, hematochezia, dysuria, hematuria, rash, seizure activity, orthopnea, PND, pedal edema, claudication. All remaining systems negative.   PHYSICAL EXAM:   VS:  BP 131/67 mmHg  Pulse 55  Ht 5\' 8"  (1.727 m)  Wt 180 lb 1.9 oz (81.702 kg)  BMI 27.39 kg/m2   GEN: Well nourished, well developed, in no acute distress HEENT: normal Neck: no JVD, carotid bruits, or masses Cardiac: RRR; no rubs, or gallops,no edema, 2/6 systolic murmur Respiratory:  clear to auscultation bilaterally, normal work of breathing GI: soft, nontender, nondistended, + BS MS: no deformity or atrophy Skin: warm and dry, no rash Neuro:  Alert and Oriented x 3, Strength and sensation are intact Psych: euthymic mood, full affect  Wt Readings from Last 3 Encounters:  05/27/16 180 lb 1.9 oz (81.702 kg)  03/23/16 176 lb (79.833 kg)  01/09/16 178 lb (80.74 kg)      Studies/Labs Reviewed:   EKG:  EKG Sinus rhythm with no ST changes.  Recent Labs: 12/02/2015: ALT 22; Pro B Natriuretic peptide (BNP) 110; TSH 3.07 03/24/2016: BUN 28*; Creatinine 2.2*; Hemoglobin 11.7*; Platelets 188; Potassium 5.0; Sodium 142   Lipid Panel    Component Value Date/Time   CHOL 153 04/22/2015 1004   TRIG 124 04/22/2015 1004   HDL 57 04/22/2015 1004   CHOLHDL 2.7 04/22/2015 1004   VLDL 25 04/22/2015 1004   LDLCALC 71 04/22/2015 1004      A/P  1 Coronary artery disease-continue statin. Patient does have occasional chest pain with long walks. This appears to be chronic. Plan stress nuclear study for risk stratification.  2 proximal atrial fibrillation-he remains  in sinus rhythm. Continue amiodarone. Check chest x-ray. I will check TSH and liver functions when he returns in 3 months. Continue xarelto 15 mg daily. GFR 35. Plan repeat hemoglobin and renal function when he returns in 3 months.  3 hypertension-blood pressure control.  4 hyperlipidemia-continue present medications. He has not tolerated high-dose statins, Crestor or Lipitor previously.  5 renal insufficiency-Followed by nephrology.    Medication Adjustments/Labs and Tests Ordered: Current medicines are reviewed at length with the patient today.  Concerns regarding medicines are outlined above.  Medication changes, Labs and Tests ordered today are listed in the Patient Instructions below. There are no Patient Instructions on file for this  visit.   Signed, Kirk Ruths, MD  05/27/2016 2:47 PM    [place]

## 2016-05-27 ENCOUNTER — Ambulatory Visit (INDEPENDENT_AMBULATORY_CARE_PROVIDER_SITE_OTHER): Payer: Medicare Other

## 2016-05-27 ENCOUNTER — Ambulatory Visit (INDEPENDENT_AMBULATORY_CARE_PROVIDER_SITE_OTHER): Payer: Medicare Other | Admitting: Cardiology

## 2016-05-27 ENCOUNTER — Encounter: Payer: Self-pay | Admitting: Cardiology

## 2016-05-27 VITALS — BP 131/67 | HR 55 | Ht 68.0 in | Wt 180.1 lb

## 2016-05-27 DIAGNOSIS — Z09 Encounter for follow-up examination after completed treatment for conditions other than malignant neoplasm: Secondary | ICD-10-CM | POA: Diagnosis not present

## 2016-05-27 DIAGNOSIS — E785 Hyperlipidemia, unspecified: Secondary | ICD-10-CM

## 2016-05-27 DIAGNOSIS — I10 Essential (primary) hypertension: Secondary | ICD-10-CM

## 2016-05-27 DIAGNOSIS — Z79899 Other long term (current) drug therapy: Secondary | ICD-10-CM | POA: Diagnosis not present

## 2016-05-27 DIAGNOSIS — I2510 Atherosclerotic heart disease of native coronary artery without angina pectoris: Secondary | ICD-10-CM

## 2016-05-27 DIAGNOSIS — I480 Paroxysmal atrial fibrillation: Secondary | ICD-10-CM | POA: Diagnosis not present

## 2016-05-27 NOTE — Patient Instructions (Signed)
Medication Instructions:   NO CHANGE  Testing/Procedures:  A chest x-ray takes a picture of the organs and structures inside the chest, including the heart, lungs, and blood vessels. This test can show several things, including, whether the heart is enlarges; whether fluid is building up in the lungs; and whether pacemaker / defibrillator leads are still in place.   Your physician has requested that you have en exercise stress myoview. For further information please visit [email or web address]. Please follow instruction sheet, as given.     Follow-Up:  Your physician recommends that you schedule a follow-up appointment in: [place]

## 2016-06-10 ENCOUNTER — Telehealth (HOSPITAL_COMMUNITY): Payer: Self-pay | Admitting: *Deleted

## 2016-06-10 NOTE — Telephone Encounter (Signed)
Left message on voicemail in reference to upcoming appointment scheduled for 06/15/16. Phone number given for a call back so details instructions can be given.  Hubbard Robinson, RN

## 2016-06-15 ENCOUNTER — Encounter (HOSPITAL_COMMUNITY): Payer: Medicare Other

## 2016-07-15 ENCOUNTER — Telehealth (HOSPITAL_COMMUNITY): Payer: Self-pay | Admitting: *Deleted

## 2016-07-15 ENCOUNTER — Telehealth (HOSPITAL_COMMUNITY): Payer: Self-pay | Admitting: Radiology

## 2016-07-15 NOTE — Telephone Encounter (Signed)
Duplicate -Instructions given

## 2016-07-15 NOTE — Telephone Encounter (Signed)
Duplicate

## 2016-07-15 NOTE — Telephone Encounter (Signed)
Patient given detailed instructions per Myocardial Perfusion Study Information Sheet for the test on 9//11/17 at 1000. Patient notified to arrive 15 minutes early and that it is imperative to arrive on time for appointment to keep from having the test rescheduled.  If you need to cancel or reschedule your appointment, please call the office within 24 hours of your appointment. Failure to do so may result in a cancellation of your appointment, and a $50 no show fee. Patient verbalized understanding.Roberto Fields

## 2016-07-15 NOTE — Telephone Encounter (Signed)
Left message on voicemail in reference to upcoming appointment scheduled for 07/20/16. Phone number given for a call back so details instructions can be given. Roberto Fields

## 2016-07-20 ENCOUNTER — Ambulatory Visit (HOSPITAL_COMMUNITY): Payer: Medicare Other | Attending: Internal Medicine

## 2016-07-20 DIAGNOSIS — I4891 Unspecified atrial fibrillation: Secondary | ICD-10-CM | POA: Insufficient documentation

## 2016-07-20 DIAGNOSIS — R9439 Abnormal result of other cardiovascular function study: Secondary | ICD-10-CM | POA: Diagnosis not present

## 2016-07-20 DIAGNOSIS — I2510 Atherosclerotic heart disease of native coronary artery without angina pectoris: Secondary | ICD-10-CM

## 2016-07-20 DIAGNOSIS — R079 Chest pain, unspecified: Secondary | ICD-10-CM | POA: Diagnosis not present

## 2016-07-20 DIAGNOSIS — I10 Essential (primary) hypertension: Secondary | ICD-10-CM | POA: Insufficient documentation

## 2016-07-20 LAB — MYOCARDIAL PERFUSION IMAGING
CHL CUP MPHR: 148 {beats}/min
CHL CUP NUCLEAR SDS: 6
CSEPED: 4 min
CSEPHR: 72 %
CSEPPHR: 107 {beats}/min
Estimated workload: 3.5 METS
Exercise duration (sec): 11 s
LHR: 0.33
LV sys vol: 52 mL
LVDIAVOL: 108 mL (ref 62–150)
NUC STRESS TID: 0.96
RPE: 19
Rest HR: 48 {beats}/min
SRS: 4
SSS: 10

## 2016-07-20 MED ORDER — TECHNETIUM TC 99M TETROFOSMIN IV KIT
32.3000 | PACK | Freq: Once | INTRAVENOUS | Status: AC | PRN
  Administered 2016-07-20: 32.3 via INTRAVENOUS
  Filled 2016-07-20: qty 32

## 2016-07-20 MED ORDER — REGADENOSON 0.4 MG/5ML IV SOLN
0.4000 mg | Freq: Once | INTRAVENOUS | Status: AC
  Administered 2016-07-20: 0.4 mg via INTRAVENOUS

## 2016-07-20 MED ORDER — TECHNETIUM TC 99M TETROFOSMIN IV KIT
10.2000 | PACK | Freq: Once | INTRAVENOUS | Status: AC | PRN
  Administered 2016-07-20: 10 via INTRAVENOUS
  Filled 2016-07-20: qty 10

## 2016-07-22 ENCOUNTER — Other Ambulatory Visit: Payer: Self-pay | Admitting: Family Medicine

## 2016-07-27 ENCOUNTER — Other Ambulatory Visit: Payer: Self-pay

## 2016-07-27 ENCOUNTER — Ambulatory Visit (INDEPENDENT_AMBULATORY_CARE_PROVIDER_SITE_OTHER): Payer: Medicare Other | Admitting: Family Medicine

## 2016-07-27 ENCOUNTER — Telehealth: Payer: Self-pay | Admitting: *Deleted

## 2016-07-27 ENCOUNTER — Encounter: Payer: Self-pay | Admitting: Family Medicine

## 2016-07-27 VITALS — BP 124/70 | HR 48 | Ht 68.0 in | Wt 176.0 lb

## 2016-07-27 DIAGNOSIS — J449 Chronic obstructive pulmonary disease, unspecified: Secondary | ICD-10-CM

## 2016-07-27 DIAGNOSIS — I10 Essential (primary) hypertension: Secondary | ICD-10-CM | POA: Diagnosis not present

## 2016-07-27 DIAGNOSIS — I4891 Unspecified atrial fibrillation: Secondary | ICD-10-CM | POA: Diagnosis not present

## 2016-07-27 DIAGNOSIS — E785 Hyperlipidemia, unspecified: Secondary | ICD-10-CM | POA: Diagnosis not present

## 2016-07-27 MED ORDER — RIVAROXABAN 15 MG PO TABS: 15.0000 mg | ORAL_TABLET | Freq: Every day | ORAL | 1 refills | Status: DC

## 2016-07-27 MED ORDER — PRAVASTATIN SODIUM 10 MG PO TABS: 10.0000 mg | ORAL_TABLET | Freq: Every day | ORAL | 4 refills | Status: DC

## 2016-07-27 MED ORDER — LOSARTAN POTASSIUM 100 MG PO TABS: ORAL_TABLET | ORAL | 1 refills | Status: DC

## 2016-07-27 NOTE — Progress Notes (Signed)
Subjective:    CC: HTN  HPI: Hypertension- Pt denies chest pain, SOB, dizziness, or heart palpitations.  Taking meds as directed w/o problems.  Denies medication side effects.    Afib - On amiodarone, CaCHB, ASA, Xarelto. ON PPI for protection.  Doing well and is asymptomatic.  Just had a stress test that was normal about 2 weeks ago.   COPD - no recent flares or exacerbations.  Not had to use rescue inhaler.   Past medical history, Surgical history, Family history not pertinant except as noted below, Social history, Allergies, and medications have been entered into the medical record, reviewed, and corrections made.   Review of Systems: No fevers, chills, night sweats, weight loss, chest pain, or shortness of breath. Denis any dizziness or lightheadedness.  Still gets occ Chest Pain.   Objective:    General: Well Developed, well nourished, and in no acute distress.  Neuro: Alert and oriented x3, extra-ocular muscles intact, sensation grossly intact.  HEENT: Normocephalic, atraumatic  Skin: Warm and dry, no rashes. Cardiac: Regular rate and rhythm, no murmurs rubs or gallops, no lower extremity edema.  Respiratory: Clear to auscultation bilaterally. Not using accessory muscles, speaking in full sentences.   Impression and Recommendations:    HTN - Well controlled. Continue current regimen. Follow up in  6 mo.   Afib - Stay on PPI while on dual therapy.   COPD - Stable. No recent exacerbation.

## 2016-07-27 NOTE — Telephone Encounter (Signed)
Pt's forms for pt assistance for xarelto completed,faxed and scanned, confirmation received.Audelia Hives [place]

## 2016-07-27 NOTE — Addendum Note (Signed)
Addended by: Teddy Spike on: 07/27/2016 10:46 AM   Modules accepted: Orders

## 2016-07-28 LAB — LIPID PANEL
CHOLESTEROL: 173 mg/dL (ref 125–200)
HDL: 44 mg/dL (ref >=40)
LDL Cholesterol: 94 mg/dL (ref <130)
TRIGLYCERIDES: 174 mg/dL — AB (ref <150)
Total CHOL/HDL Ratio: 3.9 Ratio (ref <=5.0)
VLDL: 35 mg/dL — ABNORMAL HIGH (ref <30)

## 2016-08-12 ENCOUNTER — Telehealth: Payer: Self-pay | Admitting: *Deleted

## 2016-08-12 NOTE — Telephone Encounter (Signed)
Lvm informing pt's wife that the form is incomplete and additional information is needed to get the medication that he needs. She can bring by their most recent tax returns and we can make copies of these and give them back to her so that we can fax them to the program to be accepted. Should she have any questions she can call back.Audelia Hives [place]

## 2016-08-17 NOTE — Telephone Encounter (Signed)
Pt's wife brought in tax forms. Forms were faxed with pt's application. Fax confirmation received .Roberto Fields [place]

## 2016-08-18 NOTE — Progress Notes (Signed)
HPI: FU CAD. Previously cared for at [facility]. Catheterization 2013 showed a 99% obtuse marginal and 70% PDA. Patient had PCI of his marginal. Note based on records his disease was predominantly distal and not amenable to revascularization. Carotid Doppler September 2014 showed 1-39% bilateral stenosis. Echocardiogram September 2014 showed normal LV systolic function, Mild left ventricular hypertrophy and mild aortic insufficiency. Patient also with history of atrial fibrillation. Nuclear study September 2017 showed mild inferolateral ischemia and ejection fraction 52%. Study felt to be low risk and we are treating medically. Since last seen, patient has some dyspnea on exertion which he attributes to COPD. No orthopnea, PND or pedal edema. He has occasional pain in the left chest area described as a pinching sensation. Lasts approximately 1 minute and resolves. Occurs both at rest and with exertion and is unchanged since prior to his stents were placed. He does not clearly have exertional chest pain.   Current Outpatient Prescriptions  Medication Sig Dispense Refill  . albuterol (PROVENTIL HFA;VENTOLIN HFA) 108 (90 Base) MCG/ACT inhaler Inhale 2 puffs into the lungs every 6 (six) hours as needed for wheezing. 18 g 4  . amiodarone (PACERONE) 200 MG tablet Take 200 mg by mouth daily.   2  . aspirin 81 MG EC tablet Take 81 mg by mouth daily.      Marland Kitchen gabapentin (NEURONTIN) 300 MG capsule Take 300 mg by mouth 3 (three) times daily.   0  . levothyroxine (SYNTHROID, LEVOTHROID) 88 MCG tablet Take 1 tablet (88 mcg total) by mouth daily. Hold until patient calls. 90 tablet 1  . losartan (COZAAR) 100 MG tablet TAKE 1 BY MOUTH DAILY. Hold until patient calls. 90 tablet 1  . meclizine (ANTIVERT) 25 MG tablet Take 1 tablet (25 mg total) by mouth 3 (three) times daily as needed for dizziness. 30 tablet 2  . niacin (NIASPAN) 500 MG CR tablet Take 500 mg by mouth daily.     . nitroGLYCERIN  (NITROSTAT) 0.4 MG SL tablet Place 1 tablet (0.4 mg total) under the tongue every 5 (five) minutes as needed for chest pain. 90 tablet 3  . omeprazole (PRILOSEC) 40 MG capsule Take 1 capsule (40 mg total) by mouth daily. 90 capsule 2  . pravastatin (PRAVACHOL) 10 MG tablet Take 1 tablet (10 mg total) by mouth daily. 90 tablet 4  . Rivaroxaban (XARELTO) 15 MG TABS tablet Take 1 tablet (15 mg total) by mouth daily with supper. 90 tablet 1  . traMADol (ULTRAM) 50 MG tablet Take 1 tablet (50 mg total) by mouth 2 (two) times daily as needed. 90 tablet 2   No current facility-administered medications for this visit.      Past Medical History:  Diagnosis Date  . BPH (benign prostatic hyperplasia)   . CAD (coronary artery disease)   . Cataract    bilateral cataracts removed  . COPD (chronic obstructive pulmonary disease) ([place])   . CVA (cerebral infarction)   . Hyperlipidemia   . Hypertension   . Hypothyroid   . Renal insufficiency   . Ulcer ([place]) 1992   HX peptic ulcer- DX: by endoscopy    Past Surgical History:  Procedure Laterality Date  . arm surgery    . CORONARY STENT PLACEMENT  04/2011   LAD, [facility]  . EYE SURGERY    . HERNIA MESH REMOVAL    . [place]  . TRANSURETHRAL RESECTION OF PROSTATE  Social History   Social History  . Marital status: Married    Spouse name: N/A  . Number of children: 3  . Years of education: N/A   Occupational History  . Not on file.   Social History Main Topics  . Smoking status: Former Research scientist (life sciences)  . Smokeless tobacco: Never Used  . Alcohol use No  . Drug use: No  . Sexual activity: Not on file   Other Topics Concern  . Not on file   Social History Narrative  . No narrative on file    Family History  Problem Relation Age of Onset  . Heart disease Father   . Coronary artery disease Brother   . Heart attack Brother 36    pacemaker and defibrillator  . Coronary artery disease Brother     . Hypertension Other     family history of  . Heart disease Sister     ROS: no fevers or chills, productive cough, hemoptysis, dysphasia, odynophagia, melena, hematochezia, dysuria, hematuria, rash, seizure activity, orthopnea, PND, pedal edema, claudication. Remaining systems are negative.  Physical Exam: Well-developed well-nourished in no acute distress.  Skin is warm and dry.  HEENT is normal.  Neck is supple.  Chest is clear to auscultation with normal expansion.  Cardiovascular exam is regular rate and rhythm.  Abdominal exam nontender or distended. No masses palpated. Extremities show no edema. neuro grossly intact  ECG-sinus bradycardia with no ST changes.   A/P  1 coronary artery disease-continue statin.  patient is having occasional chest pain that sounds to be chronic. It is not exertional and lasts less than 1 minute. Previous nuclear study low risk and we are treating medically. I do not think we need to pursue a cardiac catheterization at this point.  2 paroxysmal atrial fibrillation-continue amiodarone. Check liver functions and TSH. Continue xarelto; check hemoglobin and renal function.  3 hypertension-blood pressure controlled. Continue present medications.  4 hyperlipidemia-continue present medications. He has not tolerated high-dose statins, Crestor or Lipitor previously.  5 renal insufficiency-followed by nephrology.   Kirk Ruths, MD

## 2016-08-19 ENCOUNTER — Encounter: Payer: Self-pay | Admitting: Cardiology

## 2016-08-19 ENCOUNTER — Ambulatory Visit (INDEPENDENT_AMBULATORY_CARE_PROVIDER_SITE_OTHER): Payer: Medicare Other | Admitting: Cardiology

## 2016-08-19 VITALS — BP 112/58 | HR 53 | Ht 68.0 in | Wt 177.1 lb

## 2016-08-19 DIAGNOSIS — E7849 Other hyperlipidemia: Secondary | ICD-10-CM

## 2016-08-19 DIAGNOSIS — I480 Paroxysmal atrial fibrillation: Secondary | ICD-10-CM

## 2016-08-19 DIAGNOSIS — I10 Essential (primary) hypertension: Secondary | ICD-10-CM

## 2016-08-19 DIAGNOSIS — I2510 Atherosclerotic heart disease of native coronary artery without angina pectoris: Secondary | ICD-10-CM

## 2016-08-19 DIAGNOSIS — E784 Other hyperlipidemia: Secondary | ICD-10-CM

## 2016-08-19 DIAGNOSIS — I4891 Unspecified atrial fibrillation: Secondary | ICD-10-CM

## 2016-08-19 NOTE — Patient Instructions (Signed)

## 2016-08-20 LAB — BASIC METABOLIC PANEL
BUN: 28 mg/dL — AB (ref 7–25)
CO2: 26 mmol/L (ref 20–31)
CREATININE: 2.29 mg/dL — AB (ref 0.70–1.18)
Calcium: 8.7 mg/dL (ref 8.6–10.3)
Chloride: 106 mmol/L (ref 98–110)
GLUCOSE: 86 mg/dL (ref 65–99)
Potassium: 5.3 mmol/L (ref 3.5–5.3)
Sodium: 140 mmol/L (ref 135–146)

## 2016-08-20 LAB — CBC
HEMATOCRIT: 36.5 % — AB (ref 38.5–50.0)
Hemoglobin: 12 g/dL — ABNORMAL LOW (ref 13.2–17.1)
MCH: 33.4 pg — ABNORMAL HIGH (ref 27.0–33.0)
MCHC: 32.9 g/dL (ref 32.0–36.0)
MCV: 101.7 fL — AB (ref 80.0–100.0)
MPV: 11.9 fL (ref 7.5–12.5)
Platelets: 176 10*3/uL (ref 140–400)
RBC: 3.59 MIL/uL — ABNORMAL LOW (ref 4.20–5.80)
RDW: 13.9 % (ref 11.0–15.0)
WBC: 5.4 10*3/uL (ref 3.8–10.8)

## 2016-08-20 LAB — HEPATIC FUNCTION PANEL
ALK PHOS: 102 U/L (ref 40–115)
ALT: 17 U/L (ref 9–46)
AST: 17 U/L (ref 10–35)
Albumin: 3.8 g/dL (ref 3.6–5.1)
BILIRUBIN DIRECT: 0.1 mg/dL (ref <=0.2)
BILIRUBIN TOTAL: 0.5 mg/dL (ref 0.2–1.2)
Indirect Bilirubin: 0.4 mg/dL (ref 0.2–1.2)
Total Protein: 6.1 g/dL (ref 6.1–8.1)

## 2016-08-20 LAB — TSH: TSH: 1.85 mIU/L (ref 0.40–4.50)

## 2016-09-09 ENCOUNTER — Telehealth: Payer: Self-pay | Admitting: *Deleted

## 2016-09-09 NOTE — Telephone Encounter (Signed)
[name] states he needs a refill on xarelto. Patient should still have medication and a refill. This was just prescribed a 90 day supply on 07/27/16 with one refill.wife notified.

## 2016-10-05 ENCOUNTER — Other Ambulatory Visit: Payer: Self-pay | Admitting: *Deleted

## 2016-10-05 MED ORDER — AMIODARONE HCL 200 MG PO TABS: 200.0000 mg | ORAL_TABLET | Freq: Every day | ORAL | 5 refills | Status: DC

## 2016-10-05 NOTE — Telephone Encounter (Signed)
REFILL 

## 2016-11-16 DIAGNOSIS — H52221 Regular astigmatism, right eye: Secondary | ICD-10-CM | POA: Diagnosis not present

## 2016-11-16 DIAGNOSIS — M316 Other giant cell arteritis: Secondary | ICD-10-CM | POA: Diagnosis not present

## 2016-11-16 DIAGNOSIS — Z961 Presence of intraocular lens: Secondary | ICD-10-CM | POA: Diagnosis not present

## 2016-11-16 DIAGNOSIS — H43813 Vitreous degeneration, bilateral: Secondary | ICD-10-CM | POA: Diagnosis not present

## 2016-11-16 DIAGNOSIS — H524 Presbyopia: Secondary | ICD-10-CM | POA: Diagnosis not present

## 2016-11-16 DIAGNOSIS — H40053 Ocular hypertension, bilateral: Secondary | ICD-10-CM | POA: Diagnosis not present

## 2016-11-16 DIAGNOSIS — H16223 Keratoconjunctivitis sicca, not specified as Sjogren's, bilateral: Secondary | ICD-10-CM | POA: Diagnosis not present

## 2016-11-16 DIAGNOSIS — Q132 Other congenital malformations of iris: Secondary | ICD-10-CM | POA: Diagnosis not present

## 2016-11-16 DIAGNOSIS — H5213 Myopia, bilateral: Secondary | ICD-10-CM | POA: Diagnosis not present

## 2016-11-30 ENCOUNTER — Other Ambulatory Visit: Payer: Self-pay | Admitting: Family Medicine

## 2016-11-30 DIAGNOSIS — E039 Hypothyroidism, unspecified: Secondary | ICD-10-CM

## 2017-01-05 DIAGNOSIS — N183 Chronic kidney disease, stage 3 (moderate): Secondary | ICD-10-CM | POA: Diagnosis not present

## 2017-01-05 LAB — CBC AND DIFFERENTIAL
HCT: 38 % — AB (ref 41–53)
HEMOGLOBIN: 12.7 g/dL — AB (ref 13.5–17.5)
PLATELETS: 194 10*3/uL (ref 150–399)
WBC: 5.8 10*3/mL

## 2017-01-05 LAB — BASIC METABOLIC PANEL
BUN: 24 mg/dL — AB (ref 4–21)
Creatinine: 2.1 mg/dL — AB (ref 0.6–1.3)
Glucose: 95 mg/dL
POTASSIUM: 4.7 mmol/L (ref 3.4–5.3)
Sodium: 142 mmol/L (ref 137–147)

## 2017-01-05 LAB — HEPATIC FUNCTION PANEL
ALT: 18 U/L (ref 10–40)
AST: 20 U/L (ref 14–40)
Alkaline Phosphatase: 102 U/L (ref 25–125)
Bilirubin, Total: 0.4 mg/dL

## 2017-01-06 ENCOUNTER — Other Ambulatory Visit: Payer: Self-pay | Admitting: Family Medicine

## 2017-01-08 DIAGNOSIS — H16143 Punctate keratitis, bilateral: Secondary | ICD-10-CM | POA: Diagnosis not present

## 2017-01-08 DIAGNOSIS — Q132 Other congenital malformations of iris: Secondary | ICD-10-CM | POA: Diagnosis not present

## 2017-01-08 DIAGNOSIS — H43813 Vitreous degeneration, bilateral: Secondary | ICD-10-CM | POA: Diagnosis not present

## 2017-01-08 DIAGNOSIS — H16223 Keratoconjunctivitis sicca, not specified as Sjogren's, bilateral: Secondary | ICD-10-CM | POA: Diagnosis not present

## 2017-01-08 DIAGNOSIS — Z961 Presence of intraocular lens: Secondary | ICD-10-CM | POA: Diagnosis not present

## 2017-01-08 DIAGNOSIS — H40053 Ocular hypertension, bilateral: Secondary | ICD-10-CM | POA: Diagnosis not present

## 2017-01-08 DIAGNOSIS — H5213 Myopia, bilateral: Secondary | ICD-10-CM | POA: Diagnosis not present

## 2017-01-08 DIAGNOSIS — H52221 Regular astigmatism, right eye: Secondary | ICD-10-CM | POA: Diagnosis not present

## 2017-01-08 DIAGNOSIS — H524 Presbyopia: Secondary | ICD-10-CM | POA: Diagnosis not present

## 2017-01-19 ENCOUNTER — Encounter: Payer: Self-pay | Admitting: Family Medicine

## 2017-01-20 ENCOUNTER — Other Ambulatory Visit: Payer: Self-pay | Admitting: Family Medicine

## 2017-01-25 ENCOUNTER — Ambulatory Visit (INDEPENDENT_AMBULATORY_CARE_PROVIDER_SITE_OTHER): Payer: PPO | Admitting: Family Medicine

## 2017-01-25 ENCOUNTER — Encounter: Payer: Self-pay | Admitting: Family Medicine

## 2017-01-25 VITALS — BP 148/68 | HR 54 | Ht 68.0 in | Wt 178.0 lb

## 2017-01-25 DIAGNOSIS — I10 Essential (primary) hypertension: Secondary | ICD-10-CM

## 2017-01-25 DIAGNOSIS — E039 Hypothyroidism, unspecified: Secondary | ICD-10-CM | POA: Diagnosis not present

## 2017-01-25 DIAGNOSIS — I4891 Unspecified atrial fibrillation: Secondary | ICD-10-CM

## 2017-01-25 DIAGNOSIS — J449 Chronic obstructive pulmonary disease, unspecified: Secondary | ICD-10-CM

## 2017-01-25 DIAGNOSIS — I25118 Atherosclerotic heart disease of native coronary artery with other forms of angina pectoris: Secondary | ICD-10-CM

## 2017-01-25 DIAGNOSIS — E781 Pure hyperglyceridemia: Secondary | ICD-10-CM

## 2017-01-25 LAB — BASIC METABOLIC PANEL WITH GFR
BUN: 26 mg/dL — AB (ref 7–25)
CALCIUM: 9.4 mg/dL (ref 8.6–10.3)
CHLORIDE: 107 mmol/L (ref 98–110)
CO2: 23 mmol/L (ref 20–31)
CREATININE: 2.03 mg/dL — AB (ref 0.70–1.18)
GFR, Est African American: 37 mL/min — ABNORMAL LOW (ref >=60)
GFR, Est Non African American: 32 mL/min — ABNORMAL LOW (ref >=60)
GLUCOSE: 95 mg/dL (ref 65–99)
Potassium: 5.2 mmol/L (ref 3.5–5.3)
SODIUM: 141 mmol/L (ref 135–146)

## 2017-01-25 MED ORDER — ROSUVASTATIN CALCIUM 5 MG PO TABS: 5.0000 mg | ORAL_TABLET | Freq: Every day | ORAL | 1 refills | Status: DC

## 2017-01-25 MED ORDER — AMLODIPINE BESYLATE 2.5 MG PO TABS: 2.5000 mg | ORAL_TABLET | Freq: Every day | ORAL | 1 refills | Status: DC

## 2017-01-25 NOTE — Progress Notes (Signed)
Subjective:    CC: HTN, thyroid, afib  HPI: Hypertension- Pt denies chest pain, SOB, dizziness, or heart palpitations.  Taking meds as directed w/o problems.  Denies medication side effects.    Hypothyroid - doing well. No change in energy or weight. Tak[place] medication regularlly.   Afib - No chest pain. Still gets some shortest of breath with activity but has been trying to walk daily for 7 days a week.  CAD- he did try the pravastatin but says it caused his legs ache so he stopped it. He's also previously tried atorvastatin and simvastatin.  COPD - doing well. Some SOB with acitivity but his is not new.  Exercising by wakling daily.   Past medical history, Surgical history, Family history not pertinant except as noted below, Social history, Allergies, and medications have been entered into the medical record, reviewed, and corrections made.   Review of Systems: No fevers, chills, night sweats, weight loss, chest pain.   Objective:    General: Well Developed, well nourished, and in no acute distress.  Neuro: Alert and oriented x3, extra-ocular muscles intact, sensation grossly intact.  HEENT: Normocephalic, atraumatic  Skin: Warm and dry, no rashes. Cardiac: Regular rate and rhythm, no murmurs rubs or gallops, no lower extremity edema.  Respiratory: Clear to auscultation bilaterally. Not using accessory muscles, speaking in full sentences.   Impression and Recommendations:    HTN - BP mildly elevated today. Normally at [place]. F/U in 2-3 weeks for nurse BP check.   Afib - Stable. In fact heart rate is in regular rate and rhythm today. Into new Xarelto.  Hypothyroid - Due to recheck thyroid level.  Coronary artery disease-we'll try Crestor since he is Art[place] tried 3 other statins. He is still on the niacin.  COPD - stable. No recent exacerbations

## 2017-01-26 NOTE — Progress Notes (Signed)
All labs are normal. 

## 2017-02-15 ENCOUNTER — Encounter: Payer: Self-pay | Admitting: Family Medicine

## 2017-02-15 DIAGNOSIS — M25512 Pain in left shoulder: Secondary | ICD-10-CM | POA: Diagnosis not present

## 2017-02-15 DIAGNOSIS — I6350 Cerebral infarction due to unspecified occlusion or stenosis of unspecified cerebral artery: Secondary | ICD-10-CM | POA: Diagnosis not present

## 2017-02-15 DIAGNOSIS — R42 Dizziness and giddiness: Secondary | ICD-10-CM | POA: Diagnosis not present

## 2017-02-15 DIAGNOSIS — R51 Headache: Secondary | ICD-10-CM | POA: Diagnosis not present

## 2017-02-16 DIAGNOSIS — H52221 Regular astigmatism, right eye: Secondary | ICD-10-CM | POA: Diagnosis not present

## 2017-02-16 DIAGNOSIS — Q132 Other congenital malformations of iris: Secondary | ICD-10-CM | POA: Diagnosis not present

## 2017-02-16 DIAGNOSIS — H16223 Keratoconjunctivitis sicca, not specified as Sjogren's, bilateral: Secondary | ICD-10-CM | POA: Diagnosis not present

## 2017-02-16 DIAGNOSIS — Z961 Presence of intraocular lens: Secondary | ICD-10-CM | POA: Diagnosis not present

## 2017-02-16 DIAGNOSIS — H524 Presbyopia: Secondary | ICD-10-CM | POA: Diagnosis not present

## 2017-02-16 DIAGNOSIS — H5213 Myopia, bilateral: Secondary | ICD-10-CM | POA: Diagnosis not present

## 2017-02-16 DIAGNOSIS — H40053 Ocular hypertension, bilateral: Secondary | ICD-10-CM | POA: Diagnosis not present

## 2017-02-16 DIAGNOSIS — H16143 Punctate keratitis, bilateral: Secondary | ICD-10-CM | POA: Diagnosis not present

## 2017-02-16 DIAGNOSIS — H43813 Vitreous degeneration, bilateral: Secondary | ICD-10-CM | POA: Diagnosis not present

## 2017-02-24 ENCOUNTER — Other Ambulatory Visit: Payer: Self-pay | Admitting: Family Medicine

## 2017-03-31 ENCOUNTER — Other Ambulatory Visit: Payer: Self-pay | Admitting: Cardiology

## 2017-03-31 NOTE — Telephone Encounter (Signed)
REFILL 

## 2017-04-14 DIAGNOSIS — H16143 Punctate keratitis, bilateral: Secondary | ICD-10-CM | POA: Diagnosis not present

## 2017-04-14 DIAGNOSIS — Z961 Presence of intraocular lens: Secondary | ICD-10-CM | POA: Diagnosis not present

## 2017-04-14 DIAGNOSIS — H524 Presbyopia: Secondary | ICD-10-CM | POA: Diagnosis not present

## 2017-04-14 DIAGNOSIS — Q132 Other congenital malformations of iris: Secondary | ICD-10-CM | POA: Diagnosis not present

## 2017-04-14 DIAGNOSIS — H16223 Keratoconjunctivitis sicca, not specified as Sjogren's, bilateral: Secondary | ICD-10-CM | POA: Diagnosis not present

## 2017-04-14 DIAGNOSIS — H5213 Myopia, bilateral: Secondary | ICD-10-CM | POA: Diagnosis not present

## 2017-04-14 DIAGNOSIS — H40053 Ocular hypertension, bilateral: Secondary | ICD-10-CM | POA: Diagnosis not present

## 2017-04-14 DIAGNOSIS — H52221 Regular astigmatism, right eye: Secondary | ICD-10-CM | POA: Diagnosis not present

## 2017-04-14 DIAGNOSIS — H43813 Vitreous degeneration, bilateral: Secondary | ICD-10-CM | POA: Diagnosis not present

## 2017-04-28 ENCOUNTER — Other Ambulatory Visit: Payer: Self-pay | Admitting: Family Medicine

## 2017-05-17 ENCOUNTER — Other Ambulatory Visit: Payer: Self-pay | Admitting: Pharmacist

## 2017-05-17 NOTE — Patient Outreach (Signed)
Roberto Fields was referred by [employer] to pharmacy for medication assistance. Per referral, patient stated that he was in the coverage gap and could not afford his medications. Left a HIPAA compliant message on the patient's voicemail. If have not heard from patient by 05/19/17, will give him another call at that time.  [name], PharmD, [facility] [phone]

## 2017-05-19 ENCOUNTER — Other Ambulatory Visit: Payer: Self-pay | Admitting: Pharmacist

## 2017-05-19 NOTE — Patient Outreach (Signed)
Roberto Fields was referred by [employer] to pharmacy for medication assistance. Per referral, patient stated that he was in the coverage gap and could not afford his medications. Called and spoke with patient. HIPAA identifiers verified and verbal consent received. Patient reports that he has been having trouble with affording his medications, particularly his Xarelto. [name] asks that I speak with his wife about his medications and affordability,  [name] reports that her husband has not applied for extra help. Offer to complete this application with them over the phone. However, [name] reports that they would prefer to complete the application directly with Social Security. Provide [name] with the phone number to call for Social Security to request a paper application. [name] reports that they will call to request this form and then return it to [employer]. Let [name] know that there are other patient assistance options that we can look at if patient is denied the extra help.  Counsel on the importance of medication adherence. [name] states that the patient's PCP has given the patient samples of Xarelto in the past. Counsel on the importance of reaching out to his PCP now so that he has samples prior to running out.   [name] denies any further medication questions/concerns at this time. Offer to call back in 1 month to follow up about patient assistance needs. [name] takes my phone number and states that she would prefer to call me back when they need further assistance. Will close pharmacy episode at this time.   [name], PharmD, [place] [facility] [phone]

## 2017-05-24 DIAGNOSIS — I6350 Cerebral infarction due to unspecified occlusion or stenosis of unspecified cerebral artery: Secondary | ICD-10-CM | POA: Diagnosis not present

## 2017-05-24 DIAGNOSIS — R42 Dizziness and giddiness: Secondary | ICD-10-CM | POA: Diagnosis not present

## 2017-06-02 ENCOUNTER — Other Ambulatory Visit: Payer: Self-pay | Admitting: Family Medicine

## 2017-06-07 ENCOUNTER — Other Ambulatory Visit: Payer: Self-pay | Admitting: Pharmacist

## 2017-06-07 NOTE — Patient Outreach (Addendum)
Loleta Rose was referred by [facility] to pharmacy again for medication assistance. Per referral, patient stated that he was in the coverage gap and could not afford his medications. Called and spoke with patient. HIPAA identifiers verified and verbal consent received. Mr. Ealy gives permission for me to speak with his wife as well.   Again review with Mr and Mrs. Snowdon the requirements of the extra help and Xarelto patient assistance applications. Mrs. Hillyard reports that they have not yet gone to the Social Security office to complete the extra help application. Again offer to complete this application with them over the phone. Setup an appointment to complete this application with Mr and Mrs. Reifsteck over the phone on 06/11/17.  Let Mr and Mrs. Heindl know that I will mail them the patient assistance application for Xarelto. Mrs. Qualley also asks about patient assistance for Lumigan eye drops. Will send the form for the patient assistance program from [place] as well.   PLAN:  1) Will call Mr and Mrs. Berent on 06/11/17 at 8:30 am to complete the extra help application.  2) Pharmacy to mail Mr. Siddoway a copy of the Xarelto and Lumigan patient assistance program applications.  [name], PharmD, [facility] [phone]

## 2017-06-09 ENCOUNTER — Other Ambulatory Visit: Payer: Self-pay | Admitting: Family Medicine

## 2017-06-09 DIAGNOSIS — E039 Hypothyroidism, unspecified: Secondary | ICD-10-CM

## 2017-06-11 ENCOUNTER — Other Ambulatory Visit: Payer: Self-pay | Admitting: Pharmacist

## 2017-06-11 NOTE — Patient Outreach (Signed)
Call to [name], as scheduled, to complete the extra help application. [name] lets me know that they have decided that they are no longer interested in completing any of the applications that we previously discussed, either the extra help or patient assistance. She states that she and [name] do not wish to give out their information via the mail. Counsel on the importance of medication adherence. Counsel again that the extra help application can be completed directly at the [employer] office and that the patient assistance program applications can be faxed directly to the manufacturer programs, rather than mailed. [name] verbalizes understanding and states that she will call me if she and the patient change their minds.   Provide my phone number again. Deny any further medication questions/concerns at this time. Will close pharmacy episode.  [name], PharmD, [facility] [phone]

## 2017-06-24 ENCOUNTER — Ambulatory Visit: Payer: Self-pay | Admitting: Pharmacy Technician

## 2017-06-30 ENCOUNTER — Other Ambulatory Visit: Payer: Self-pay | Admitting: Cardiology

## 2017-06-30 NOTE — Telephone Encounter (Signed)
Rx(s) sent to pharmacy electronically.  

## 2017-08-02 ENCOUNTER — Ambulatory Visit (INDEPENDENT_AMBULATORY_CARE_PROVIDER_SITE_OTHER): Payer: PPO | Admitting: Family Medicine

## 2017-08-02 ENCOUNTER — Encounter: Payer: Self-pay | Admitting: Family Medicine

## 2017-08-02 VITALS — BP 142/62 | HR 46 | Ht 68.0 in | Wt 167.0 lb

## 2017-08-02 DIAGNOSIS — I25118 Atherosclerotic heart disease of native coronary artery with other forms of angina pectoris: Secondary | ICD-10-CM | POA: Diagnosis not present

## 2017-08-02 DIAGNOSIS — I4891 Unspecified atrial fibrillation: Secondary | ICD-10-CM | POA: Diagnosis not present

## 2017-08-02 DIAGNOSIS — Z23 Encounter for immunization: Secondary | ICD-10-CM

## 2017-08-02 DIAGNOSIS — E039 Hypothyroidism, unspecified: Secondary | ICD-10-CM

## 2017-08-02 DIAGNOSIS — I10 Essential (primary) hypertension: Secondary | ICD-10-CM | POA: Diagnosis not present

## 2017-08-02 LAB — COMPLETE METABOLIC PANEL WITH GFR
AG Ratio: 2.2 (calc) (ref 1.0–2.5)
ALBUMIN MSPROF: 4.3 g/dL (ref 3.6–5.1)
ALKALINE PHOSPHATASE (APISO): 114 U/L (ref 40–115)
ALT: 9 U/L (ref 9–46)
AST: 12 U/L (ref 10–35)
BUN / CREAT RATIO: 11 (calc) (ref 6–22)
BUN: 25 mg/dL (ref 7–25)
CO2: 30 mmol/L (ref 20–32)
Calcium: 9.1 mg/dL (ref 8.6–10.3)
Chloride: 107 mmol/L (ref 98–110)
Creat: 2.22 mg/dL — ABNORMAL HIGH (ref 0.70–1.18)
GFR, Est African American: 33 mL/min/{1.73_m2} — ABNORMAL LOW (ref >=60)
GFR, Est Non African American: 28 mL/min/{1.73_m2} — ABNORMAL LOW (ref >=60)
GLOBULIN: 2 g/dL (ref 1.9–3.7)
Glucose, Bld: 89 mg/dL (ref 65–99)
Potassium: 4.8 mmol/L (ref 3.5–5.3)
SODIUM: 142 mmol/L (ref 135–146)
Total Bilirubin: 0.5 mg/dL (ref 0.2–1.2)
Total Protein: 6.3 g/dL (ref 6.1–8.1)

## 2017-08-02 LAB — LIPID PANEL W/REFLEX DIRECT LDL
CHOL/HDL RATIO: 5.1 (calc) — AB (ref <5.0)
CHOLESTEROL: 199 mg/dL (ref <200)
HDL: 39 mg/dL — ABNORMAL LOW (ref >40)
LDL Cholesterol (Calc): 135 mg/dL (calc) — ABNORMAL HIGH
NON-HDL CHOLESTEROL (CALC): 160 mg/dL — AB (ref <130)
Triglycerides: 123 mg/dL (ref <150)

## 2017-08-02 LAB — TSH: TSH: 0.73 m[IU]/L (ref 0.40–4.50)

## 2017-08-02 NOTE — Progress Notes (Signed)
Subjective:    CC: HTN, afib, CAD  HPI: Hypertension- Pt denies chest pain, SOB, dizziness, or heart palpitations.  Taking meds as directed w/o problems.  Denies medication side effects.  Bp was a little high at recent eye doc visit but his wife says his BPs look great at home.   Afib - He is in the donut hole with Xarelto again for this year.   CAD - No recent chest pain or shortness of breath.  Hypothyroid - taking levothyroxine 88 g daily.No recent change in skin, hair or weight.   Past medical history, Surgical history, Family history not pertinant except as noted below, Social history, Allergies, and medications have been entered into the medical record, reviewed, and corrections made.   Review of Systems: No fevers, chills, night sweats, weight loss, chest pain, or shortness of breath.   Objective:    General: Well Developed, well nourished, and in no acute distress.  Neuro: Alert and oriented x3, extra-ocular muscles intact, sensation grossly intact.  HEENT: Normocephalic, atraumatic  Skin: Warm and dry, no rashes. Cardiac: Regular rate and rhythm, no murmurs rubs or gallops, no lower extremity edema.  Respiratory: Clear to auscultation bilaterally. Not using accessory muscles, speaking in full sentences.   Impression and Recommendations:    HTN - BP still mildly elevated. Recheck in 2 weeks with nurse visit. Due for labs.    Afib - encouraged them to apply for finanacial assistance for Xarelto.    CAD - Stable. Recheck lipids today.   Hypothyroid - due to recheck TSH.

## 2017-08-06 ENCOUNTER — Encounter: Payer: Self-pay | Admitting: Family Medicine

## 2017-08-10 ENCOUNTER — Telehealth: Payer: Self-pay | Admitting: *Deleted

## 2017-08-10 NOTE — Telephone Encounter (Signed)
Spoke w/pt's wife and he has tried and failed 3 previous statins (atorvastatin, simvastatin, pravastatin) I informed her that he was supposed to be on crestor 5 mg which was given to him in march of this year. She stated that he has not taken this. I told her that he should. She asked about him taking Zetia. I told her that I would fwd this to pcp for advice.. .  She also stated that his voice got really weak and she reports that this has happened previously when visiting his sister at [facility]ospice and this past Saturday when they were out shopping. She asked him if he felt ok he said that he did. He is not c/o a sore throat or cough. She wanted to know what Dr. Madilyn Fireman thought about this.Audelia Hives [place]

## 2017-08-12 NOTE — Telephone Encounter (Signed)
Left message advising of recommendations.  

## 2017-08-12 NOTE — Telephone Encounter (Signed)
Zetia doesn't have the proof to reduce risk of stroke. If happens again then have him come in for appointment.

## 2017-08-18 ENCOUNTER — Telehealth: Payer: Self-pay

## 2017-08-18 NOTE — Telephone Encounter (Signed)
Pt is requesting a refill on tramadol.  Please advise.

## 2017-08-19 MED ORDER — TRAMADOL HCL 50 MG PO TABS: 50.0000 mg | ORAL_TABLET | Freq: Two times a day (BID) | ORAL | 0 refills | Status: DC | PRN

## 2017-08-19 NOTE — Telephone Encounter (Signed)
Ok to fill 

## 2017-08-19 NOTE — Telephone Encounter (Signed)
rx sent

## 2017-09-09 ENCOUNTER — Other Ambulatory Visit: Payer: Self-pay | Admitting: Family Medicine

## 2017-09-09 DIAGNOSIS — E039 Hypothyroidism, unspecified: Secondary | ICD-10-CM

## 2017-09-24 ENCOUNTER — Encounter: Payer: Self-pay | Admitting: Cardiology

## 2017-09-27 ENCOUNTER — Other Ambulatory Visit: Payer: Self-pay | Admitting: Cardiology

## 2017-09-27 ENCOUNTER — Other Ambulatory Visit: Payer: Self-pay | Admitting: *Deleted

## 2017-09-27 NOTE — Progress Notes (Signed)
HPI: FU CAD. Previously cared for at [facility]. Catheterization 2013 showed a 99% obtuse marginal and 70% PDA. Patient had PCI of his marginal. Note based on records his disease was predominantly distal and not amenable to revascularization. Carotid Doppler September 2014 showed 1-39% bilateral stenosis. Echocardiogram September 2014 showed normal LV systolic function, Mild left ventricular hypertrophy and mild aortic insufficiency. Patient also with history of atrial fibrillation. Nuclear study September 2017 showed mild inferolateral ischemia and ejection fraction 52%. Study felt to be low risk and we are treating medically. Renal ultrasound 2016 showed no aneurysm. Since last seen, patient has occasional chest pain both at rest and with exertion. It is relieved with nitroglycerin. It is long-standing and unchanged in severity. He denies dyspnea or syncope.  Current Outpatient Medications  Medication Sig Dispense Refill  . albuterol (PROVENTIL HFA;VENTOLIN HFA) 108 (90 Base) MCG/ACT inhaler Inhale 2 puffs into the lungs every 6 (six) hours as needed for wheezing. 18 g 4  . amiodarone (PACERONE) 200 MG tablet Take 1 tablet (200 mg total) by mouth daily. 90 tablet 0  . amLODipine (NORVASC) 2.5 MG tablet Take 1 tablet (2.5 mg total) by mouth daily. 90 tablet 1  . aspirin 81 MG EC tablet Take 81 mg by mouth daily.      Marland Kitchen gabapentin (NEURONTIN) 300 MG capsule Take 300 mg by mouth 3 (three) times daily.   0  . levothyroxine (SYNTHROID, LEVOTHROID) 88 MCG tablet TAKE 1 TABLET BY MOUTH ONCE DAILY 90 tablet 1  . losartan (COZAAR) 100 MG tablet TAKE 1 TABLET BY MOUTH ONCE DAILY 90 tablet 1  . meclizine (ANTIVERT) 25 MG tablet TAKE ONE TABLET BY MOUTH THREE TIMES DAILY AS NEEDED FOR DIZZINESS 90 tablet 1  . niacin (NIASPAN) 500 MG CR tablet Take 500 mg by mouth daily.     . nitroGLYCERIN (NITROSTAT) 0.4 MG SL tablet DISSOLVE ONE TABLET UNDER THE TONGUE EVERY 5 MINUTES AS NEEDED FOR CHEST  PAIN. 100 tablet 3  . omeprazole (PRILOSEC) 40 MG capsule TAKE ONE CAPSULE BY MOUTH ONCE DAILY 90 capsule 2  . rosuvastatin (CRESTOR) 5 MG tablet Take 1 tablet (5 mg total) by mouth daily. 30 tablet 1  . traMADol (ULTRAM) 50 MG tablet Take 1 tablet (50 mg total) by mouth 2 (two) times daily as needed. 90 tablet 0  . XARELTO 15 MG TABS tablet TAKE ONE TABLET BY MOUTH ONCE DAILY WITH EVENING MEAL. 90 tablet 1   No current facility-administered medications for this visit.      Past Medical History:  Diagnosis Date  . BPH (benign prostatic hyperplasia)   . CAD (coronary artery disease)   . Cataract    bilateral cataracts removed  . COPD (chronic obstructive pulmonary disease) ([place])   . CVA (cerebral infarction)   . Hyperlipidemia   . Hypertension   . Hypothyroid   . Renal insufficiency   . Ulcer 1992   HX peptic ulcer- DX: by endoscopy    Past Surgical History:  Procedure Laterality Date  . arm surgery    . CORONARY STENT PLACEMENT  04/2011   LAD, [facility]  . EYE SURGERY    . HERNIA MESH REMOVAL    . [place]  . TRANSURETHRAL RESECTION OF PROSTATE      Social History   Socioeconomic History  . Marital status: Married    Spouse name: Not on file  . Number of children:  3  . Years of education: Not on file  . Highest education level: Not on file  Social Needs  . Financial resource strain: Not on file  . Food insecurity - worry: Not on file  . Food insecurity - inability: Not on file  . Transportation needs - medical: Not on file  . Transportation needs - non-medical: Not on file  Occupational History  . Not on file  Tobacco Use  . Smoking status: Former Research scientist (life sciences)  . Smokeless tobacco: Never Used  Substance and Sexual Activity  . Alcohol use: No  . Drug use: No  . Sexual activity: Not on file  Other Topics Concern  . Not on file  Social History Narrative  . Not on file    Family History  Problem Relation Age of Onset    . Heart disease Father   . Coronary artery disease Brother   . Heart attack Brother 36       pacemaker and defibrillator  . Coronary artery disease Brother   . Hypertension Other        family history of  . Heart disease Sister     ROS: some residual pain left ankle from recent fall but no fevers or chills, productive cough, hemoptysis, dysphasia, odynophagia, melena, hematochezia, dysuria, hematuria, rash, seizure activity, orthopnea, PND, pedal edema, claudication. Remaining systems are negative.  Physical Exam: Well-developed well-nourished in no acute distress.  Skin is warm and dry.  HEENT is normal.  Neck is supple.  Chest is clear to auscultation with normal expansion.  Cardiovascular exam is regular rate and rhythm.  Abdominal exam nontender or distended. No masses palpated. Extremities show no edema. Mild tenderness to palpation over lateral malleolus. neuro grossly intact  ECG- sinus bradycardia at a rate of 56. No ST changes. personally reviewed  A/P  1 coronary artery disease-plan to continue medical therapy. Intolerant to statins. DC aspirin given need for anticoagulation. He continues to have occasional chest pain that appears to be chronic. Previous nuclear study low risk. Will not pursue cardiac catheterization unless symptoms worsen.  2 paroxysmal atrial fibrillation-continue amiodarone for rhythm control. Check chest x-ray; recent liver functions and TSH normal. Continue xarelto. Recent GFR 30.  3 hypertension-blood pressure is controlled. Continue present medications.  4 hyperlipidemia-patient has not tolerated statins previously. We'll add Zetia 10 mg. Check lipids in 4 weeks. He is not interested in pursuing lipid clinic or [place].  5 renal insufficiency-followed by nephrology.  Kirk Ruths, MD

## 2017-09-27 NOTE — Progress Notes (Signed)
error 

## 2017-10-06 ENCOUNTER — Ambulatory Visit: Payer: PPO | Admitting: Cardiology

## 2017-10-06 ENCOUNTER — Encounter: Payer: Self-pay | Admitting: Cardiology

## 2017-10-06 ENCOUNTER — Ambulatory Visit (INDEPENDENT_AMBULATORY_CARE_PROVIDER_SITE_OTHER): Payer: PPO

## 2017-10-06 VITALS — BP 135/59 | HR 56 | Ht 68.0 in | Wt 168.4 lb

## 2017-10-06 DIAGNOSIS — Z5181 Encounter for therapeutic drug level monitoring: Secondary | ICD-10-CM

## 2017-10-06 DIAGNOSIS — E7800 Pure hypercholesterolemia, unspecified: Secondary | ICD-10-CM | POA: Diagnosis not present

## 2017-10-06 DIAGNOSIS — I10 Essential (primary) hypertension: Secondary | ICD-10-CM

## 2017-10-06 DIAGNOSIS — I480 Paroxysmal atrial fibrillation: Secondary | ICD-10-CM

## 2017-10-06 DIAGNOSIS — I2510 Atherosclerotic heart disease of native coronary artery without angina pectoris: Secondary | ICD-10-CM | POA: Diagnosis not present

## 2017-10-06 DIAGNOSIS — I4891 Unspecified atrial fibrillation: Secondary | ICD-10-CM | POA: Diagnosis not present

## 2017-10-06 MED ORDER — AMIODARONE HCL 200 MG PO TABS: 200.0000 mg | ORAL_TABLET | Freq: Every day | ORAL | 0 refills | Status: DC

## 2017-10-06 MED ORDER — EZETIMIBE 10 MG PO TABS: 10.0000 mg | ORAL_TABLET | Freq: Every day | ORAL | 3 refills | Status: DC

## 2017-10-06 NOTE — Patient Instructions (Signed)
Medication Instructions:   STOP ASPIRIN  START EZETIMIBE 10 MG ONCE DAILY  Labwork:  Your physician recommends that you CALL ME IF ABLE TO AFFORD ZETIA  Testing/Procedures:  A chest x-ray takes a picture of the organs and structures inside the chest, including the heart, lungs, and blood vessels. This test can show several things, including, whether the heart is enlarges; whether fluid is building up in the lungs; and whether pacemaker / defibrillator leads are still in place.   Follow-Up:  Your physician wants you to follow-up in: [place] will receive a reminder letter in the mail two months in advance. If you don't receive a letter, please call our office to schedule the follow-up appointment.   If you need a refill on your cardiac medications before your next appointment, please call your pharmacy.

## 2017-10-21 ENCOUNTER — Other Ambulatory Visit: Payer: Self-pay | Admitting: Family Medicine

## 2017-10-25 DIAGNOSIS — G44209 Tension-type headache, unspecified, not intractable: Secondary | ICD-10-CM | POA: Diagnosis not present

## 2017-10-25 DIAGNOSIS — R42 Dizziness and giddiness: Secondary | ICD-10-CM | POA: Diagnosis not present

## 2017-11-08 DIAGNOSIS — H40053 Ocular hypertension, bilateral: Secondary | ICD-10-CM | POA: Diagnosis not present

## 2017-12-07 ENCOUNTER — Other Ambulatory Visit: Payer: Self-pay | Admitting: *Deleted

## 2017-12-07 MED ORDER — LOSARTAN POTASSIUM 100 MG PO TABS: 100.0000 mg | ORAL_TABLET | Freq: Every day | ORAL | 1 refills | Status: DC

## 2017-12-14 ENCOUNTER — Telehealth: Payer: Self-pay

## 2017-12-14 DIAGNOSIS — H40053 Ocular hypertension, bilateral: Secondary | ICD-10-CM | POA: Diagnosis not present

## 2017-12-14 NOTE — Telephone Encounter (Signed)
Perfect. Thank you!

## 2017-12-14 NOTE — Telephone Encounter (Signed)
[facility] called and states the levothyroxine has been changed to another Scientist, water quality. I advised ok to fill but they need to tell patient he will need to have a TSH in 6-8 weeks after starting the new medication.

## 2017-12-15 NOTE — Telephone Encounter (Signed)
Left message advising patient of recommendations.  °

## 2018-01-04 DIAGNOSIS — N183 Chronic kidney disease, stage 3 (moderate): Secondary | ICD-10-CM | POA: Diagnosis not present

## 2018-01-05 ENCOUNTER — Other Ambulatory Visit: Payer: Self-pay | Admitting: Cardiology

## 2018-01-05 DIAGNOSIS — I480 Paroxysmal atrial fibrillation: Secondary | ICD-10-CM

## 2018-01-05 NOTE — Telephone Encounter (Signed)
REFILL 

## 2018-01-18 DIAGNOSIS — R42 Dizziness and giddiness: Secondary | ICD-10-CM | POA: Diagnosis not present

## 2018-01-18 DIAGNOSIS — I6350 Cerebral infarction due to unspecified occlusion or stenosis of unspecified cerebral artery: Secondary | ICD-10-CM | POA: Diagnosis not present

## 2018-01-31 ENCOUNTER — Ambulatory Visit: Payer: PPO | Admitting: Family Medicine

## 2018-02-14 ENCOUNTER — Ambulatory Visit (INDEPENDENT_AMBULATORY_CARE_PROVIDER_SITE_OTHER): Payer: PPO | Admitting: Family Medicine

## 2018-02-14 ENCOUNTER — Encounter: Payer: Self-pay | Admitting: Family Medicine

## 2018-02-14 VITALS — BP 175/59 | HR 45 | Ht 68.0 in | Wt 164.0 lb

## 2018-02-14 DIAGNOSIS — I2510 Atherosclerotic heart disease of native coronary artery without angina pectoris: Secondary | ICD-10-CM | POA: Diagnosis not present

## 2018-02-14 DIAGNOSIS — E039 Hypothyroidism, unspecified: Secondary | ICD-10-CM

## 2018-02-14 DIAGNOSIS — J449 Chronic obstructive pulmonary disease, unspecified: Secondary | ICD-10-CM

## 2018-02-14 DIAGNOSIS — R209 Unspecified disturbances of skin sensation: Secondary | ICD-10-CM

## 2018-02-14 DIAGNOSIS — R51 Headache: Secondary | ICD-10-CM | POA: Diagnosis not present

## 2018-02-14 DIAGNOSIS — I10 Essential (primary) hypertension: Secondary | ICD-10-CM

## 2018-02-14 DIAGNOSIS — R519 Headache, unspecified: Secondary | ICD-10-CM

## 2018-02-14 DIAGNOSIS — K219 Gastro-esophageal reflux disease without esophagitis: Secondary | ICD-10-CM | POA: Diagnosis not present

## 2018-02-14 MED ORDER — TELMISARTAN 80 MG PO TABS: 80.0000 mg | ORAL_TABLET | Freq: Every day | ORAL | 1 refills | Status: DC

## 2018-02-14 MED ORDER — LEVOTHYROXINE SODIUM 88 MCG PO TABS: 88.0000 ug | ORAL_TABLET | Freq: Every day | ORAL | 1 refills | Status: DC

## 2018-02-14 NOTE — Progress Notes (Signed)
Subjective:    CC: BP, heart disease.  He also has some new concerns today.  HPI:  Hypertension- Pt denies chest pain, SOB, dizziness, or heart palpitations.  Taking meds as directed w/o problems.  Denies medication side effects.  He did receive a letter from his insurance company saying that the losartan has been recalled and that needs to be switched to a new blood pressure medication.  F/U CAD   -no recent chest pain or shortness of breath.  Hypothyroidism-we had to change the manufacturer of his thyroid medication in February.  So he is due for recheck on his TSH.  He has been having frequent headaches but went back to see Dr. Percell Locus.  They did an MRI which was essentially negative and Dr. Percell Locus he increased his gabapentin but says it really has not seemed to help his headaches.  He denies any significant changes in caffeine.  He reports that he sleeps really well at night.  No recent medication changes.  He also reports he is been getting some dark color changes of his hands.  He says they almost look dark blue or black at times.  He is not sure what triggers it but it does seem to come and go.  When asked if it is triggered by cold he says he just really was not sure.  He does have some permanent hyperpigmentation of the dorsum of the hands bilaterally.  He is not sure long how long it has been going on.  Past medical history, Surgical history, Family history not pertinant except as noted below, Social history, Allergies, and medications have been entered into the medical record, reviewed, and corrections made.   Review of Systems: No fevers, chills, night sweats, weight loss, chest pain, or shortness of breath.   Objective:    General: Well Developed, well nourished, and in no acute distress.  Neuro: Alert and oriented x3, extra-ocular muscles intact, sensation grossly intact.  HEENT: Normocephalic, atraumatic  Skin: Warm and dry, no rashes.  His hands do have a bluish discoloration  today but they also feel cold to touch. Cardiac: Regular rate and rhythm, no murmurs rubs or gallops, no lower extremity edema.  Respiratory: Clear to auscultation bilaterally. Not using accessory muscles, speaking in full sentences.   Impression and Recommendations:    HTN -blood pressure was still elevated after recheck.  Not sure why.  He says he did take his blood pressure medications this morning and is feeling well.  Will have him follow-up in 1 week to recheck blood pressure with nurse visit.  If it is not back down we may be able to increase his amlodipine to 5 mg.  The losartan to Micardis.  CAD -stable.  Hypothyroidism-due for repeat check TSH.  Recurrent headaches-unclear etiology.  Not improved with increasing gabapentin.  No recent medication changes.  No change in caffeine or sleep.  Could also be coming from his neck.  Is have follow-up with neurology coming up soon.  Discoloration of hands-I suspect either new onset Raynaud's or possibly peripheral vascular disease.  He is on a calcium channel blocker already.  We will recheck thyroid as well.  It may need to be adjusted.  gERD - still taking PPI daily.

## 2018-02-15 LAB — TSH: TSH: 1.83 mIU/L (ref 0.40–4.50)

## 2018-02-21 ENCOUNTER — Other Ambulatory Visit: Payer: Self-pay | Admitting: Family Medicine

## 2018-02-21 ENCOUNTER — Ambulatory Visit (INDEPENDENT_AMBULATORY_CARE_PROVIDER_SITE_OTHER): Payer: PPO | Admitting: Family Medicine

## 2018-02-21 VITALS — BP 162/58 | HR 53 | Wt 166.0 lb

## 2018-02-21 DIAGNOSIS — I10 Essential (primary) hypertension: Secondary | ICD-10-CM | POA: Diagnosis not present

## 2018-02-21 NOTE — Progress Notes (Signed)
   Subjective:    Patient ID: Roberto Fields, male    DOB: 13-Feb-1946, 72 y.o.   MRN: [id number]  HPI  Roberto Fields is here for blood pressure check. He reports he did take the amlodipine and Losartan this morning. He has not started the Micardis. Denies chest pain, shortness of breath or dizziness. He does have a headache.   Review of Systems     Objective:   Physical Exam        Assessment & Plan:  HTN- Per Dr Madilyn Fireman, Patient advised to switch to the Micardis and stop the Losartan. Also advised to return in 2 weeks for nurse visit blood pressure check.

## 2018-02-21 NOTE — Progress Notes (Signed)
Agree with documentation as above.   Desean Heemstra, MD  

## 2018-03-07 ENCOUNTER — Ambulatory Visit (INDEPENDENT_AMBULATORY_CARE_PROVIDER_SITE_OTHER): Payer: PPO | Admitting: Family Medicine

## 2018-03-07 VITALS — BP 146/45 | HR 48

## 2018-03-07 DIAGNOSIS — I10 Essential (primary) hypertension: Secondary | ICD-10-CM

## 2018-03-07 NOTE — Progress Notes (Signed)
HTN - BP not at goal and Diastolic low.  See note below.  Beatrice Lecher, MD

## 2018-03-07 NOTE — Progress Notes (Signed)
Pt came into clinic today for BP check. Does report he is having some "vertigo" this morning, states "this isn't new" and he has had "bouts of vertigo" multiple times. Pt does state he has been taking the Micardis since last NV (2 weeks ago) and reports his home BP readings have been good. Wife states last night his BP was 121/64. Pt's BP in office today was not at goal. Did let him sit before a recheck. Spoke with PCP regarding readings, she wants him to push fluids. Goal: 60oz of water per day. Pt to return to clinic on Friday for recheck, and to bring home BP cuff for calibration check. Follow up appt made.

## 2018-03-11 ENCOUNTER — Ambulatory Visit (INDEPENDENT_AMBULATORY_CARE_PROVIDER_SITE_OTHER): Payer: PPO | Admitting: Sports Medicine

## 2018-03-11 ENCOUNTER — Other Ambulatory Visit: Payer: Self-pay | Admitting: Family Medicine

## 2018-03-11 VITALS — BP 114/45 | HR 52 | Wt 164.0 lb

## 2018-03-11 DIAGNOSIS — E039 Hypothyroidism, unspecified: Secondary | ICD-10-CM

## 2018-03-11 DIAGNOSIS — I10 Essential (primary) hypertension: Secondary | ICD-10-CM | POA: Diagnosis not present

## 2018-03-11 NOTE — Assessment & Plan Note (Signed)
Blood pressure normalized on recheck, no change in medications.

## 2018-03-11 NOTE — Progress Notes (Signed)
   Subjective:    Patient ID: Roberto Fields, male    DOB: 07-02-1946, 72 y.o.   MRN: [id number]  HPI  Roberto Fields is here for blood pressure check. He did increase his fluid intake as directed on last visit. His blood pressure in the office today is 140/55 with our monitor and it was 135/58 with his home monitor today in the office. Denies chest pain, shortness of breath or dizziness.   Review of Systems     Objective:   Physical Exam        Assessment & Plan:  HTN - Continue current medication as directed. Follow up with Dr Roberto Fields in August.

## 2018-03-17 ENCOUNTER — Other Ambulatory Visit: Payer: Self-pay | Admitting: Family Medicine

## 2018-03-17 MED ORDER — AMLODIPINE BESYLATE 2.5 MG PO TABS: 2.5000 mg | ORAL_TABLET | Freq: Every day | ORAL | 1 refills | Status: DC

## 2018-03-22 NOTE — Progress Notes (Signed)
HPI: Roberto Fields. Previously cared for at [facility]. Catheterization 2013 showed a 99% obtuse marginal and 70% PDA. Patient had PCI of his marginal. Note based on records his disease was predominantly distal and not amenable to revascularization. Carotid Doppler September 2014 showed 1-39% bilateral stenosis. Echocardiogram September 2014 showed normal LV systolic function, Mild left ventricular hypertrophy and mild aortic insufficiency. Patient also with history of atrial fibrillation.Nuclear study September 2017 showed mild inferolateral ischemia and ejection fraction 52%. Study felt to be low risk and we are treating medically.Renal ultrasound 2016 showed no aneurysm. Since last seen,he has some dyspnea on exertion but no orthopnea, PND or pedal edema.  He has some chest tightness with exertion relieved with rest.  Occasional symptoms at rest.  These are long-standing apparently since his previous PCI.  Unchanged compared to last office visit.  Current Outpatient Medications  Medication Sig Dispense Refill  . albuterol (PROVENTIL HFA;VENTOLIN HFA) 108 (90 Base) MCG/ACT inhaler Inhale 2 puffs into the lungs every 6 (six) hours as needed for wheezing. 18 g 4  . amiodarone (PACERONE) 200 MG tablet TAKE 1 TABLET BY MOUTH ONCE DAILY 90 tablet 1  . amLODipine (NORVASC) 2.5 MG tablet Take 1 tablet (2.5 mg total) by mouth daily. 90 tablet 1  . gabapentin (NEURONTIN) 300 MG capsule Take 300 mg by mouth 3 (three) times daily.    Marland Kitchen levothyroxine (SYNTHROID, LEVOTHROID) 88 MCG tablet TAKE 1 TABLET BY MOUTH EVERY DAY 90 tablet 1  . meclizine (ANTIVERT) 25 MG tablet TAKE ONE TABLET BY MOUTH THREE TIMES DAILY AS NEEDED FOR DIZZINESS 90 tablet 1  . niacin (NIASPAN) 500 MG CR tablet Take 500 mg by mouth daily.     . nitroGLYCERIN (NITROSTAT) 0.4 MG SL tablet DISSOLVE ONE TABLET UNDER THE TONGUE EVERY 5 MINUTES AS NEEDED FOR CHEST PAIN. 100 tablet 3  . omeprazole (PRILOSEC) 40 MG capsule TAKE ONE  CAPSULE BY MOUTH ONCE DAILY 90 capsule 2  . telmisartan (MICARDIS) 80 MG tablet Take 1 tablet (80 mg total) by mouth daily. 90 tablet 1  . traMADol (ULTRAM) 50 MG tablet Take 1 tablet (50 mg total) by mouth 2 (two) times daily as needed. 90 tablet 0  . XARELTO 15 MG TABS tablet TAKE 1 TABLET BY MOUTH ONCE DAILY WITH EVENING MEAL 90 tablet 1   No current facility-administered medications for this visit.      Past Medical History:  Diagnosis Date  . BPH (benign prostatic hyperplasia)   . CAD (coronary artery disease)   . Cataract    bilateral cataracts removed  . COPD (chronic obstructive pulmonary disease) ([place])   . CVA (cerebral infarction)   . Hyperlipidemia   . Hypertension   . Hypothyroid   . Renal insufficiency   . Ulcer 1992   HX peptic ulcer- DX: by endoscopy    Past Surgical History:  Procedure Laterality Date  . arm surgery    . CORONARY STENT PLACEMENT  04/2011   [facility], [facility]  . EYE SURGERY    . HERNIA MESH REMOVAL    . [place]  . TRANSURETHRAL RESECTION OF PROSTATE      Social History   Socioeconomic History  . Marital status: Married    Spouse name: Not on file  . Number of children: 3  . Years of education: Not on file  . Highest education level: Not on file  Occupational History  . Not on  file  Social Needs  . Financial resource strain: Not on file  . Food insecurity:    Worry: Not on file    Inability: Not on file  . Transportation needs:    Medical: Not on file    Non-medical: Not on file  Tobacco Use  . Smoking status: Former Research scientist (life sciences)  . Smokeless tobacco: Never Used  Substance and Sexual Activity  . Alcohol use: No  . Drug use: No  . Sexual activity: Not on file  Lifestyle  . Physical activity:    Days per week: Not on file    Minutes per session: Not on file  . Stress: Not on file  Relationships  . Social connections:    Talks on phone: Not on file    Gets together: Not on file    Attends  religious service: Not on file    Active member of club or organization: Not on file    Attends meetings of clubs or organizations: Not on file    Relationship status: Not on file  . Intimate partner violence:    Fear of current or ex partner: Not on file    Emotionally abused: Not on file    Physically abused: Not on file    Forced sexual activity: Not on file  Other Topics Concern  . Not on file  Social History Narrative  . Not on file    Family History  Problem Relation Age of Onset  . Heart disease Father   . Coronary artery disease Brother   . Heart attack Brother 36       pacemaker and defibrillator  . Coronary artery disease Brother   . Hypertension Other        family history of  . Heart disease Sister     ROS: no fevers or chills, productive cough, hemoptysis, dysphasia, odynophagia, melena, hematochezia, dysuria, hematuria, rash, seizure activity, orthopnea, PND, pedal edema, claudication. Remaining systems are negative.  Physical Exam: Well-developed well-nourished in no acute distress.  Skin is warm and dry.  HEENT is normal.  Neck is supple.  Chest is clear to auscultation with normal expansion.  Cardiovascular exam is regular rate and rhythm.  Abdominal exam nontender or distended. No masses palpated. Extremities show no edema. neuro grossly intact  ECG-sinus bradycardia at a rate of 47.  No ST changes.  Personally reviewed  A/P  1 coronary artery disease-patient with chronic occasional chest pain unchanged.  Plan to continue medical therapy.  He is not on aspirin given need for anticoagulation.  Intolerant to statins.  2 paroxysmal atrial fibrillation-plan to continue amiodarone.  He is in sinus.  Check chest x-ray, liver functions; recent TSH normal.  Continue Xarelto.  Check hemoglobin and renal function.  3 hypertension-blood pressure is elevated.  However they check this at home and states it typically runs 120/70.  Continue present medications and  follow.  4 hyperlipidemia -patient is intolerant to statins. He apparently did not tolerate Zetia.  He is not interested in pursuing [place].  5 chronic stage III kidney disease-followed by nephrology.  Kirk Ruths, MD

## 2018-03-28 DIAGNOSIS — H40053 Ocular hypertension, bilateral: Secondary | ICD-10-CM | POA: Diagnosis not present

## 2018-03-30 ENCOUNTER — Ambulatory Visit (INDEPENDENT_AMBULATORY_CARE_PROVIDER_SITE_OTHER): Payer: PPO

## 2018-03-30 ENCOUNTER — Encounter: Payer: Self-pay | Admitting: Cardiology

## 2018-03-30 ENCOUNTER — Ambulatory Visit: Payer: PPO | Admitting: Cardiology

## 2018-03-30 VITALS — BP 138/60 | HR 51 | Ht 68.0 in | Wt 165.0 lb

## 2018-03-30 DIAGNOSIS — I480 Paroxysmal atrial fibrillation: Secondary | ICD-10-CM | POA: Diagnosis not present

## 2018-03-30 DIAGNOSIS — I4891 Unspecified atrial fibrillation: Secondary | ICD-10-CM | POA: Diagnosis not present

## 2018-03-30 DIAGNOSIS — I2510 Atherosclerotic heart disease of native coronary artery without angina pectoris: Secondary | ICD-10-CM

## 2018-03-30 DIAGNOSIS — E7800 Pure hypercholesterolemia, unspecified: Secondary | ICD-10-CM

## 2018-03-30 DIAGNOSIS — Z8679 Personal history of other diseases of the circulatory system: Secondary | ICD-10-CM | POA: Diagnosis not present

## 2018-03-30 DIAGNOSIS — I10 Essential (primary) hypertension: Secondary | ICD-10-CM

## 2018-03-30 LAB — BASIC METABOLIC PANEL
BUN/Creatinine Ratio: 12 (calc) (ref 6–22)
BUN: 28 mg/dL — AB (ref 7–25)
CALCIUM: 9.2 mg/dL (ref 8.6–10.3)
CO2: 27 mmol/L (ref 20–32)
Chloride: 109 mmol/L (ref 98–110)
Creat: 2.25 mg/dL — ABNORMAL HIGH (ref 0.70–1.18)
GLUCOSE: 108 mg/dL — AB (ref 65–99)
Potassium: 4.3 mmol/L (ref 3.5–5.3)
SODIUM: 144 mmol/L (ref 135–146)

## 2018-03-30 LAB — CBC
HCT: 32.8 % — ABNORMAL LOW (ref 38.5–50.0)
HEMOGLOBIN: 11.4 g/dL — AB (ref 13.2–17.1)
MCH: 33.9 pg — ABNORMAL HIGH (ref 27.0–33.0)
MCHC: 34.8 g/dL (ref 32.0–36.0)
MCV: 97.6 fL (ref 80.0–100.0)
MPV: 11.7 fL (ref 7.5–12.5)
Platelets: 144 10*3/uL (ref 140–400)
RBC: 3.36 10*6/uL — AB (ref 4.20–5.80)
RDW: 12.7 % (ref 11.0–15.0)
WBC: 5.6 10*3/uL (ref 3.8–10.8)

## 2018-03-30 LAB — HEPATIC FUNCTION PANEL
AG RATIO: 1.8 (calc) (ref 1.0–2.5)
ALT: 8 U/L — AB (ref 9–46)
AST: 13 U/L (ref 10–35)
Albumin: 4.1 g/dL (ref 3.6–5.1)
Alkaline phosphatase (APISO): 110 U/L (ref 40–115)
Bilirubin, Direct: 0.1 mg/dL (ref 0.0–0.2)
GLOBULIN: 2.3 g/dL (ref 1.9–3.7)
Indirect Bilirubin: 0.5 mg/dL (calc) (ref 0.2–1.2)
TOTAL PROTEIN: 6.4 g/dL (ref 6.1–8.1)
Total Bilirubin: 0.6 mg/dL (ref 0.2–1.2)

## 2018-03-30 NOTE — Patient Instructions (Signed)
Medication Instructions:   NO CHANGE  Labwork:  Your physician recommends that you return for lab work WHEN CONVENIENT   Testing/Procedures:  A chest x-ray takes a picture of the organs and structures inside the chest, including the heart, lungs, and blood vessels. This test can show several things, including, whether the heart is enlarges; whether fluid is building up in the lungs; and whether pacemaker / defibrillator leads are still in place.   Follow-Up:  Your physician wants you to follow-up in: [place] will receive a reminder letter in the mail two months in advance. If you don't receive a letter, please call our office to schedule the follow-up appointment.   If you need a refill on your cardiac medications before your next appointment, please call your pharmacy.

## 2018-04-25 ENCOUNTER — Other Ambulatory Visit: Payer: Self-pay | Admitting: Family Medicine

## 2018-05-02 DIAGNOSIS — R42 Dizziness and giddiness: Secondary | ICD-10-CM | POA: Diagnosis not present

## 2018-05-02 DIAGNOSIS — Z7901 Long term (current) use of anticoagulants: Secondary | ICD-10-CM | POA: Diagnosis not present

## 2018-05-02 DIAGNOSIS — G44209 Tension-type headache, unspecified, not intractable: Secondary | ICD-10-CM | POA: Diagnosis not present

## 2018-05-02 DIAGNOSIS — Z8673 Personal history of transient ischemic attack (TIA), and cerebral infarction without residual deficits: Secondary | ICD-10-CM | POA: Diagnosis not present

## 2018-06-01 ENCOUNTER — Other Ambulatory Visit: Payer: Self-pay | Admitting: Family Medicine

## 2018-06-01 NOTE — Telephone Encounter (Signed)
Last OV was 03/28/18 and has follow up on 06/20/2018. Please advise.

## 2018-06-20 ENCOUNTER — Encounter: Payer: Self-pay | Admitting: Family Medicine

## 2018-06-20 ENCOUNTER — Ambulatory Visit (INDEPENDENT_AMBULATORY_CARE_PROVIDER_SITE_OTHER): Payer: PPO | Admitting: Family Medicine

## 2018-06-20 VITALS — BP 150/62 | HR 41 | Ht 68.0 in | Wt 164.0 lb

## 2018-06-20 DIAGNOSIS — E039 Hypothyroidism, unspecified: Secondary | ICD-10-CM | POA: Diagnosis not present

## 2018-06-20 DIAGNOSIS — R001 Bradycardia, unspecified: Secondary | ICD-10-CM

## 2018-06-20 DIAGNOSIS — Z1159 Encounter for screening for other viral diseases: Secondary | ICD-10-CM

## 2018-06-20 DIAGNOSIS — I480 Paroxysmal atrial fibrillation: Secondary | ICD-10-CM | POA: Diagnosis not present

## 2018-06-20 DIAGNOSIS — I10 Essential (primary) hypertension: Secondary | ICD-10-CM

## 2018-06-20 DIAGNOSIS — K219 Gastro-esophageal reflux disease without esophagitis: Secondary | ICD-10-CM | POA: Diagnosis not present

## 2018-06-20 MED ORDER — OMEPRAZOLE 40 MG PO CPDR: 40.0000 mg | DELAYED_RELEASE_CAPSULE | Freq: Every day | ORAL | 3 refills | Status: DC

## 2018-06-20 NOTE — Progress Notes (Signed)
Subjective:    CC: HTN  HPI:  Hypertension- Pt denies chest pain, SOB, dizziness, or heart palpitations.  Taking meds as directed w/o problems.  Denies medication side effects.    Paroxysmal atrial fibrillation with coronary artery disease-he did see cardiology in May.  Unfortunately he is intolerant to statins and is off aspirin because he is Artie on anticoagulation.  He is on Xarelto.  Wife notes that he is just been feeling a little more slow and sluggish than usual over the last few weeks.  She also says he is more mentally sluggish as well.  No having headaches but they have talked with Dr. Everette Rank, neurology about this.  None of his medications were changed recently.  GERD/gastritis-he does need a refill on his omeprazole 40 mg daily.  Hypothyroidism - Taking medication regularly in the AM away from food and vitamins, etc. No recent change to skin, hair, or energy levels.   Past medical history, Surgical history, Family history not pertinant except as noted below, Social history, Allergies, and medications have been entered into the medical record, reviewed, and corrections made.   Review of Systems: No fevers, chills, night sweats, weight loss, chest pain, or shortness of breath.   Objective:    General: Well Developed, well nourished, and in no acute distress.  Neuro: Alert and oriented x3, extra-ocular muscles intact, sensation grossly intact.  HEENT: Normocephalic, atraumatic  Skin: Warm and dry, no rashes. Cardiac: Regular rate and rhythm, no murmurs rubs or gallops, no lower extremity edema.  Respiratory: Clear to auscultation bilaterally. Not using accessory muscles, speaking in full sentences.   Impression and Recommendations:    HTN - Well controlled. Continue current regimen. Follow up in  6 months.   Assessment atrial fibrillation/CAD-off of aspirin just on Xarelto.  GERD-did refill omeprazole.  It is probably worth the risk since he is also on an  anticoagulant.  Hypothyroid-due to recheck TSH.  Bradycardia - his pulse is lower than typical.  He normally runs in the low 50s to upper 40s and today is in the low 40s.  Encouraged his wife to check his blood pressure and pulse daily this week and call me and then when end of the week and let me know if his pulse is staying low.  Make sure he sits and rest for 5 minutes before taking the pressure and pulse.  He staying low that I will get in touch with Dr. [name] and see if we need to make some adjustments to his medication regimen.

## 2018-06-20 NOTE — Patient Instructions (Signed)
Go to the labs in about 6 weeks to check your labs. You will need to fast for 8 hours before you go. OK to have water before hand.

## 2018-06-24 ENCOUNTER — Telehealth: Payer: Self-pay

## 2018-06-24 NOTE — Telephone Encounter (Signed)
Roberto Fields called with Roberto Fields's blood pressure and pulse readings.   Lowest numbers 132/60  Highest numbers 147/66 Range of pulse 148-54

## 2018-06-27 NOTE — Telephone Encounter (Signed)
Today, the pulse readings he is getting at home seems better than what we got here so we will just continue to keep an eye on it.

## 2018-06-28 NOTE — Telephone Encounter (Signed)
[name] advised of recommendations.

## 2018-06-29 ENCOUNTER — Other Ambulatory Visit: Payer: Self-pay | Admitting: Cardiology

## 2018-06-29 DIAGNOSIS — I480 Paroxysmal atrial fibrillation: Secondary | ICD-10-CM

## 2018-06-29 NOTE — Telephone Encounter (Signed)
Rx request sent to pharmacy.  

## 2018-07-04 DIAGNOSIS — H40053 Ocular hypertension, bilateral: Secondary | ICD-10-CM | POA: Diagnosis not present

## 2018-08-05 DIAGNOSIS — Z1159 Encounter for screening for other viral diseases: Secondary | ICD-10-CM | POA: Diagnosis not present

## 2018-08-05 DIAGNOSIS — I10 Essential (primary) hypertension: Secondary | ICD-10-CM | POA: Diagnosis not present

## 2018-08-05 DIAGNOSIS — E039 Hypothyroidism, unspecified: Secondary | ICD-10-CM | POA: Diagnosis not present

## 2018-08-06 LAB — HEPATITIS C ANTIBODY
HEP C AB: NONREACTIVE
SIGNAL TO CUT-OFF: 0.02 (ref <1.00)

## 2018-08-06 LAB — COMPLETE METABOLIC PANEL WITH GFR
AG RATIO: 1.8 (calc) (ref 1.0–2.5)
ALT: 8 U/L — AB (ref 9–46)
AST: 13 U/L (ref 10–35)
Albumin: 4 g/dL (ref 3.6–5.1)
Alkaline phosphatase (APISO): 100 U/L (ref 40–115)
BUN / CREAT RATIO: 11 (calc) (ref 6–22)
BUN: 23 mg/dL (ref 7–25)
CALCIUM: 9.3 mg/dL (ref 8.6–10.3)
CO2: 25 mmol/L (ref 20–32)
Chloride: 109 mmol/L (ref 98–110)
Creat: 2.14 mg/dL — ABNORMAL HIGH (ref 0.70–1.18)
GFR, EST AFRICAN AMERICAN: 34 mL/min/{1.73_m2} — AB (ref >=60)
GFR, EST NON AFRICAN AMERICAN: 29 mL/min/{1.73_m2} — AB (ref >=60)
Globulin: 2.2 g/dL (calc) (ref 1.9–3.7)
Glucose, Bld: 91 mg/dL (ref 65–99)
POTASSIUM: 4.8 mmol/L (ref 3.5–5.3)
Sodium: 142 mmol/L (ref 135–146)
Total Bilirubin: 0.7 mg/dL (ref 0.2–1.2)
Total Protein: 6.2 g/dL (ref 6.1–8.1)

## 2018-08-06 LAB — LIPID PANEL
Cholesterol: 185 mg/dL (ref <200)
HDL: 37 mg/dL — ABNORMAL LOW (ref >40)
LDL CHOLESTEROL (CALC): 126 mg/dL — AB
NON-HDL CHOLESTEROL (CALC): 148 mg/dL — AB (ref <130)
TRIGLYCERIDES: 113 mg/dL (ref <150)
Total CHOL/HDL Ratio: 5 (calc) — ABNORMAL HIGH (ref <5.0)

## 2018-08-06 LAB — TSH: TSH: 0.89 m[IU]/L (ref 0.40–4.50)

## 2018-08-06 LAB — URIC ACID: URIC ACID, SERUM: 7.8 mg/dL (ref 4.0–8.0)

## 2018-08-15 ENCOUNTER — Encounter: Payer: Self-pay | Admitting: Family Medicine

## 2018-08-15 ENCOUNTER — Ambulatory Visit (INDEPENDENT_AMBULATORY_CARE_PROVIDER_SITE_OTHER): Payer: PPO | Admitting: Family Medicine

## 2018-08-15 DIAGNOSIS — Z23 Encounter for immunization: Secondary | ICD-10-CM

## 2018-08-15 MED ORDER — TELMISARTAN 80 MG PO TABS: 80.0000 mg | ORAL_TABLET | Freq: Every day | ORAL | 1 refills | Status: DC

## 2018-08-15 NOTE — Progress Notes (Signed)
Pt here for flu shot. Afebrile,no recent illness. Vaccination given, pt tolerated well.  While pt was here his wife asked about if Dr. Madilyn Fireman could get samples of the Xarelto 15 mg for him. I told her that we would try to see if we could.Marland KitchenMarland KitchenElouise Munroe, [place]

## 2018-08-29 ENCOUNTER — Telehealth: Payer: Self-pay | Admitting: Family Medicine

## 2018-08-29 DIAGNOSIS — R42 Dizziness and giddiness: Secondary | ICD-10-CM | POA: Diagnosis not present

## 2018-08-29 DIAGNOSIS — G44209 Tension-type headache, unspecified, not intractable: Secondary | ICD-10-CM | POA: Diagnosis not present

## 2018-08-29 NOTE — Telephone Encounter (Signed)
Pt is in donut hole with insurance, requesting samples of xarelto. Will contact drug rep.

## 2018-08-30 NOTE — Telephone Encounter (Signed)
Called patient and spoke with wife to let her know we are having trouble getting samples of Xarelto. I advised her that we would get in touch with her as soon as I got with Dr. Madilyn Fireman and see what her next step would be. She voices understanding.

## 2018-08-30 NOTE — Telephone Encounter (Signed)
Being handled by Barnet Pall.

## 2018-09-07 ENCOUNTER — Other Ambulatory Visit: Payer: Self-pay | Admitting: Family Medicine

## 2018-09-07 DIAGNOSIS — E039 Hypothyroidism, unspecified: Secondary | ICD-10-CM

## 2018-09-13 NOTE — Telephone Encounter (Signed)
And his wife will need to go online to the drug representative website and apply for patient assistance.  Now that he has had his Medicare gap he should be considered underinsured and may qualify for their program.

## 2018-09-13 NOTE — Telephone Encounter (Signed)
I spoke with the Eliquis representative last week and she stated that our area does not have a drug representative at this time. The patient has enough to get him through 10/18/18. We are only able to get Eliquis samples at this time. Please advise next steps.

## 2018-09-13 NOTE — Telephone Encounter (Signed)
Pt's wife advised. She does not have access to a computer, so I did give her the phone number to call about the Patient Assistance program. She will call. No further questions at this time.

## 2018-09-29 NOTE — Telephone Encounter (Signed)
Left message for patient that Dr. Madilyn Fireman has signed for samples of Xarelto to get the patient through the end of the year.  Drug representative [place] with [employer] dropped of 6 bottles. HGR:[id number] LOT:[id number] EXP:06/08/2020.

## 2018-09-30 NOTE — Telephone Encounter (Signed)
Patient has been notified and will pick up samples today 09/30/18.

## 2018-10-03 NOTE — Telephone Encounter (Signed)
Per Dr. Suzi Roots patient will need the 15 mg dose and I called there drug rep to bring [place] a few bottles.  They were dropped off.  [place]: [id number] LOT: [id number] EXPIRE: 06/2020.   Left message for patient to call back.

## 2018-10-17 DIAGNOSIS — H16143 Punctate keratitis, bilateral: Secondary | ICD-10-CM | POA: Diagnosis not present

## 2018-10-17 DIAGNOSIS — H40053 Ocular hypertension, bilateral: Secondary | ICD-10-CM | POA: Diagnosis not present

## 2018-10-17 DIAGNOSIS — H524 Presbyopia: Secondary | ICD-10-CM | POA: Diagnosis not present

## 2018-10-17 DIAGNOSIS — H16223 Keratoconjunctivitis sicca, not specified as Sjogren's, bilateral: Secondary | ICD-10-CM | POA: Diagnosis not present

## 2018-10-17 NOTE — Progress Notes (Signed)
HPI: Roberto Fields. Previously cared for at [facility]. Catheterization 2013 showed a 99% obtuse marginal and 70% PDA. Patient had PCI of his marginal. Note based on records his disease was predominantly distal and not amenable to revascularization. Carotid Doppler September 2014 showed 1-39% bilateral stenosis. Echocardiogram September 2014 showed normal LV systolic function, Mild left ventricular hypertrophy and mild aortic insufficiency. Patient also with history of atrial fibrillation.Nuclear study September 2017 showed mild inferolateral ischemia and ejection fraction 52%. Study felt to be low risk and we are treating medically.Renal ultrasound 2016 showed no aneurysm.Since last seen,he has occasional brief chest pain which is chronic.  He has mild dyspnea on exertion but no orthopnea, PND, pedal edema or syncope.  He has occasional vertigo.  Current Outpatient Medications  Medication Sig Dispense Refill  . albuterol (PROVENTIL HFA;VENTOLIN HFA) 108 (90 Base) MCG/ACT inhaler Inhale 2 puffs into the lungs every 6 (six) hours as needed for wheezing. 18 g 4  . amiodarone (PACERONE) 200 MG tablet TAKE 1 TABLET BY MOUTH ONCE DAILY 90 tablet 1  . amLODipine (NORVASC) 2.5 MG tablet Take 1 tablet (2.5 mg total) by mouth daily. 90 tablet 1  . gabapentin (NEURONTIN) 300 MG capsule Take 300 mg by mouth 3 (three) times daily.    Marland Kitchen levothyroxine (SYNTHROID, LEVOTHROID) 88 MCG tablet TAKE 1 TABLET BY MOUTH ONCE DAILY 90 tablet 1  . meclizine (ANTIVERT) 25 MG tablet TAKE ONE TABLET BY MOUTH THREE TIMES DAILY AS NEEDED FOR DIZZINESS 90 tablet 1  . niacin (NIASPAN) 500 MG CR tablet Take 500 mg by mouth daily.     . nitroGLYCERIN (NITROSTAT) 0.4 MG SL tablet DISSOLVE ONE TABLET UNDER THE TONGUE EVERY 5 MINUTES AS NEEDED FOR CHEST PAIN. 100 tablet 3  . omeprazole (PRILOSEC) 40 MG capsule Take 1 capsule (40 mg total) by mouth daily. 90 capsule 3  . telmisartan (MICARDIS) 80 MG tablet Take 1 tablet (80  mg total) by mouth daily. 90 tablet 1  . XARELTO 15 MG TABS tablet TAKE 1 TABLET BY MOUTH ONCE DAILY WITH  EVENING  MEAL 90 tablet 1   No current facility-administered medications for this visit.      Past Medical History:  Diagnosis Date  . BPH (benign prostatic hyperplasia)   . CAD (coronary artery disease)   . Cataract    bilateral cataracts removed  . COPD (chronic obstructive pulmonary disease) ([place])   . CVA (cerebral infarction)   . Hyperlipidemia   . Hypertension   . Hypothyroid   . Renal insufficiency   . Ulcer 1992   HX peptic ulcer- DX: by endoscopy    Past Surgical History:  Procedure Laterality Date  . arm surgery    . CORONARY STENT PLACEMENT  04/2011   LAD, [facility]  . EYE SURGERY    . HERNIA MESH REMOVAL    . [place]  . TRANSURETHRAL RESECTION OF PROSTATE      Social History   Socioeconomic History  . Marital status: Married    Spouse name: Not on file  . Number of children: 3  . Years of education: Not on file  . Highest education level: Not on file  Occupational History  . Not on file  Social Needs  . Financial resource strain: Not on file  . Food insecurity:    Worry: Not on file    Inability: Not on file  . Transportation needs:  Medical: Not on file    Non-medical: Not on file  Tobacco Use  . Smoking status: Former Research scientist (life sciences)  . Smokeless tobacco: Never Used  Substance and Sexual Activity  . Alcohol use: No  . Drug use: No  . Sexual activity: Not on file  Lifestyle  . Physical activity:    Days per week: Not on file    Minutes per session: Not on file  . Stress: Not on file  Relationships  . Social connections:    Talks on phone: Not on file    Gets together: Not on file    Attends religious service: Not on file    Active member of club or organization: Not on file    Attends meetings of clubs or organizations: Not on file    Relationship status: Not on file  . Intimate partner  violence:    Fear of current or ex partner: Not on file    Emotionally abused: Not on file    Physically abused: Not on file    Forced sexual activity: Not on file  Other Topics Concern  . Not on file  Social History Narrative  . Not on file    Family History  Problem Relation Age of Onset  . Heart disease Father   . Coronary artery disease Brother   . Heart attack Brother 36       pacemaker and defibrillator  . Coronary artery disease Brother   . Hypertension Other        family history of  . Heart disease Sister     ROS: no fevers or chills, productive cough, hemoptysis, dysphasia, odynophagia, melena, hematochezia, dysuria, hematuria, rash, seizure activity, orthopnea, PND, pedal edema, claudication. Remaining systems are negative.  Physical Exam: Well-developed well-nourished in no acute distress.  Skin is warm and dry.  HEENT is normal.  Neck is supple.  Chest is clear to auscultation with normal expansion.  Cardiovascular exam is regular and bradycardic Abdominal exam nontender or distended. No masses palpated. Extremities show no edema. neuro grossly intact  ECG-sinus bradycardia at a rate of 42.  First-degree AV block.  No ST changes.  Personally reviewed  A/P  1 coronary artery disease-patient has occasional chest pain which is chronic and unchanged.  Plan to continue medical therapy.  No aspirin given need for anticoagulation.  Intolerant to statins.  2 paroxysmal atrial fibrillation-patient remains in sinus rhythm.  Continue amiodarone.  Patient is bradycardic and will decrease to 100 mg daily.  If heart rate continues to be low may need to consider discontinuing.  Plan to continue anticoagulation with Xarelto for now.  I am changing to apixaban 5 mg twice daily after present supply of Xarelto expires.  3 hypertension-patient's blood pressure is elevated; increase amlodipine to 5 mg daily and follow.  4 hyperlipidemia-intolerant to statins.  Also states he did  not tolerate Zetia.  Unwilling to try Repatha.  5 chronic stage III kidney disease.  Patient is followed by nephrology.  6 bradycardia-heart rate is in the 40s today.  However he is not symptomatic.  Decrease amiodarone to 100 mg daily and follow.  May need to discontinue.  Kirk Ruths, MD

## 2018-10-26 ENCOUNTER — Encounter: Payer: Self-pay | Admitting: Cardiology

## 2018-10-26 ENCOUNTER — Ambulatory Visit (INDEPENDENT_AMBULATORY_CARE_PROVIDER_SITE_OTHER): Payer: PPO | Admitting: Cardiology

## 2018-10-26 VITALS — BP 179/65 | HR 47 | Ht 68.0 in | Wt 164.4 lb

## 2018-10-26 DIAGNOSIS — I480 Paroxysmal atrial fibrillation: Secondary | ICD-10-CM

## 2018-10-26 DIAGNOSIS — I10 Essential (primary) hypertension: Secondary | ICD-10-CM | POA: Diagnosis not present

## 2018-10-26 DIAGNOSIS — E7800 Pure hypercholesterolemia, unspecified: Secondary | ICD-10-CM | POA: Diagnosis not present

## 2018-10-26 DIAGNOSIS — I2510 Atherosclerotic heart disease of native coronary artery without angina pectoris: Secondary | ICD-10-CM | POA: Diagnosis not present

## 2018-10-26 MED ORDER — AMLODIPINE BESYLATE 5 MG PO TABS: 5.0000 mg | ORAL_TABLET | Freq: Every day | ORAL | 3 refills | Status: DC

## 2018-10-26 MED ORDER — AMIODARONE HCL 100 MG PO TABS: 100.0000 mg | ORAL_TABLET | Freq: Every day | ORAL | 3 refills | Status: DC

## 2018-10-26 MED ORDER — APIXABAN 5 MG PO TABS: 5.0000 mg | ORAL_TABLET | Freq: Two times a day (BID) | ORAL | 6 refills | Status: DC

## 2018-10-26 NOTE — Patient Instructions (Addendum)
Medication Instructions:  DECREASE AMIODARONE TO 100 MG ONCE DAILY=1/2 OF THE 200 MG TABLET ONCE DAILY  INCREASE AMLODIPINE TO 5 MG ONCE DAILY=2 OF THE 2.5 MG ONCE DAILY  STOP XARELTO WHEN CURRENT SUPPLY GONE THEN START ELIQUIS 5 MG ONE TABLET TWICE DAILY If you need a refill on your cardiac medications before your next appointment, please call your pharmacy.   Lab work: If you have labs (blood work) drawn today and your tests are completely normal, you will receive your results only by: Marland Kitchen MyChart Message (if you have MyChart) OR . A paper copy in the mail If you have any lab test that is abnormal or we need to change your treatment, we will call you to review the results.  Follow-Up: Your physician recommends that you schedule a follow-up appointment in: [place]

## 2018-12-19 ENCOUNTER — Ambulatory Visit (INDEPENDENT_AMBULATORY_CARE_PROVIDER_SITE_OTHER): Payer: PPO | Admitting: Family Medicine

## 2018-12-19 ENCOUNTER — Encounter: Payer: Self-pay | Admitting: Family Medicine

## 2018-12-19 VITALS — BP 138/58 | HR 53 | Ht 68.0 in | Wt 165.0 lb

## 2018-12-19 DIAGNOSIS — M609 Myositis, unspecified: Secondary | ICD-10-CM

## 2018-12-19 DIAGNOSIS — M791 Myalgia, unspecified site: Secondary | ICD-10-CM | POA: Diagnosis not present

## 2018-12-19 DIAGNOSIS — J449 Chronic obstructive pulmonary disease, unspecified: Secondary | ICD-10-CM | POA: Diagnosis not present

## 2018-12-19 DIAGNOSIS — I10 Essential (primary) hypertension: Secondary | ICD-10-CM

## 2018-12-19 DIAGNOSIS — R809 Proteinuria, unspecified: Secondary | ICD-10-CM | POA: Diagnosis not present

## 2018-12-19 DIAGNOSIS — N184 Chronic kidney disease, stage 4 (severe): Secondary | ICD-10-CM | POA: Diagnosis not present

## 2018-12-19 DIAGNOSIS — I2510 Atherosclerotic heart disease of native coronary artery without angina pectoris: Secondary | ICD-10-CM | POA: Diagnosis not present

## 2018-12-19 DIAGNOSIS — T466X5A Adverse effect of antihyperlipidemic and antiarteriosclerotic drugs, initial encounter: Secondary | ICD-10-CM

## 2018-12-19 LAB — POCT UA - MICROALBUMIN
Albumin/Creatinine Ratio, Urine, POC: >300
Creatinine, POC: 100 mg/dL
Microalbumin Ur, POC: 150 mg/L

## 2018-12-19 NOTE — Progress Notes (Signed)
Subjective:    CC:   HPI:  Hypertension- Pt denies chest pain, SOB, dizziness, or heart palpitations.  Taking meds as directed w/o problems.  Denies medication side effects.    F/U COPD -he reports overall he does walk well.  They walk daily for exercise.  His wife says he really only had about 1 day where he was a little short of breath and Having to stop and rest.  He did not use his albuterol because when he has used it in the past it just made as felt like his heart was racing.  F/U CAD -he does get intermittent chest pain but follows with cardiology for that.  In fact recently they discontinued his Xarelto and have switched him to Eliquis.  He has not completely switched yet because he still has a few Xarelto tabs left.  They also decreased his amiodarone and increase his amlodipine.  CKD 4 - Hasn't seen Dr. Antionette Fairy since Feb 2019.  Has an ointment scheduled for later this month.  He initially wanted to see him every 6 months but they discussed switching it to yearly. Lab Results  Component Value Date   CREATININE 2.14 (H) 08/05/2018     Past medical history, Surgical history, Family history not pertinant except as noted below, Social history, Allergies, and medications have been entered into the medical record, reviewed, and corrections made.   Review of Systems: No fevers, chills, night sweats, weight loss, chest pain, or shortness of breath.   Objective:    General: Well Developed, well nourished, and in no acute distress.  Neuro: Alert and oriented x3, extra-ocular muscles intact, sensation grossly intact.  HEENT: Normocephalic, atraumatic  Skin: Warm and dry, no rashes. Cardiac: Regular rate and rhythm, no murmurs rubs or gallops, no lower extremity edema.  Respiratory: Clear to auscultation bilaterally. Not using accessory muscles, speaking in full sentences.   Impression and Recommendations:    HTN - Well controlled. Continue current regimen. Follow up in  6 months.     COPD -Stable.  He really only had 1 day where he felt a little winded and short of breath.  He does not currently have an inhaler and does not really want to use albuterol.  We did discuss the possibility of trying levalbuterol.  He wants to think about it.  CAD -stable.  No recent worrisome symptoms.  He does follow with cardiology regularly. Intolerant to statins can [place] to myalgias.  CKD 4 -has appointment already scheduled with Dr. Antionette Fairy at the end of the month.  He is going yearly currently.  Will check urine microalbumin levels today.  As well as repeat BMP.

## 2018-12-21 LAB — BASIC METABOLIC PANEL WITH GFR
BUN/Creatinine Ratio: 13 (calc) (ref 6–22)
BUN: 26 mg/dL — ABNORMAL HIGH (ref 7–25)
CO2: 25 mmol/L (ref 20–32)
Calcium: 9.1 mg/dL (ref 8.6–10.3)
Chloride: 109 mmol/L (ref 98–110)
Creat: 2.06 mg/dL — ABNORMAL HIGH (ref 0.70–1.18)
GFR, Est African American: 36 mL/min/{1.73_m2} — ABNORMAL LOW (ref >=60)
GFR, Est Non African American: 31 mL/min/{1.73_m2} — ABNORMAL LOW (ref >=60)
Glucose, Bld: 90 mg/dL (ref 65–99)
Potassium: 5.1 mmol/L (ref 3.5–5.3)
Sodium: 141 mmol/L (ref 135–146)

## 2018-12-21 LAB — B12 AND FOLATE PANEL
Folate: 10.5 ng/mL
Vitamin B-12: 292 pg/mL (ref 200–1100)

## 2018-12-21 LAB — CBC
HCT: 30.3 % — ABNORMAL LOW (ref 38.5–50.0)
Hemoglobin: 10.3 g/dL — ABNORMAL LOW (ref 13.2–17.1)
MCH: 34.4 pg — ABNORMAL HIGH (ref 27.0–33.0)
MCHC: 34 g/dL (ref 32.0–36.0)
MCV: 101.3 fL — ABNORMAL HIGH (ref 80.0–100.0)
MPV: 12.1 fL (ref 7.5–12.5)
Platelets: 147 10*3/uL (ref 140–400)
RBC: 2.99 10*6/uL — AB (ref 4.20–5.80)
RDW: 13 % (ref 11.0–15.0)
WBC: 4.9 10*3/uL (ref 3.8–10.8)

## 2018-12-22 ENCOUNTER — Other Ambulatory Visit: Payer: Self-pay

## 2018-12-22 DIAGNOSIS — D649 Anemia, unspecified: Secondary | ICD-10-CM

## 2018-12-22 DIAGNOSIS — E538 Deficiency of other specified B group vitamins: Secondary | ICD-10-CM

## 2019-01-02 DIAGNOSIS — R69 Illness, unspecified: Secondary | ICD-10-CM | POA: Diagnosis not present

## 2019-01-05 DIAGNOSIS — Z961 Presence of intraocular lens: Secondary | ICD-10-CM | POA: Diagnosis not present

## 2019-01-05 DIAGNOSIS — H527 Unspecified disorder of refraction: Secondary | ICD-10-CM | POA: Diagnosis not present

## 2019-01-05 DIAGNOSIS — H16223 Keratoconjunctivitis sicca, not specified as Sjogren's, bilateral: Secondary | ICD-10-CM | POA: Diagnosis not present

## 2019-01-05 DIAGNOSIS — H401133 Primary open-angle glaucoma, bilateral, severe stage: Secondary | ICD-10-CM | POA: Diagnosis not present

## 2019-01-05 DIAGNOSIS — H02833 Dermatochalasis of right eye, unspecified eyelid: Secondary | ICD-10-CM | POA: Diagnosis not present

## 2019-01-05 DIAGNOSIS — H02402 Unspecified ptosis of left eyelid: Secondary | ICD-10-CM | POA: Diagnosis not present

## 2019-01-13 NOTE — Progress Notes (Signed)
HPI: FUCAD. Previously cared for at [facility]. Catheterization 2013 showed a 99% obtuse marginal and 70% PDA. Patient had PCI of his marginal. Note based on records his disease was predominantly distal and not amenable to revascularization. Carotid Doppler September 2014 showed 1-39% bilateral stenosis. Echocardiogram September 2014 showed normal LV systolic function, Mild left ventricular hypertrophy and mild aortic insufficiency. Patient also with history of atrial fibrillation.Nuclear study September 2017 showed mild inferolateral ischemia and ejection fraction 52%. Study felt to be low risk and we are treating medically.Renal ultrasound 2016 showed no aneurysm.Since last seen,patient denies dyspnea, chest pain, palpitations or syncope.  Current Outpatient Medications  Medication Sig Dispense Refill  . albuterol (PROVENTIL HFA;VENTOLIN HFA) 108 (90 Base) MCG/ACT inhaler Inhale 2 puffs into the lungs every 6 (six) hours as needed for wheezing. 18 g 4  . amiodarone (PACERONE) 100 MG tablet Take 1 tablet (100 mg total) by mouth daily. 90 tablet 3  . amLODipine (NORVASC) 5 MG tablet Take 1 tablet (5 mg total) by mouth daily. 90 tablet 3  . ELIQUIS 5 MG TABS tablet Take 5 mg by mouth 2 (two) times daily.    Marland Kitchen gabapentin (NEURONTIN) 300 MG capsule Take 300 mg by mouth 3 (three) times daily.    Marland Kitchen levothyroxine (SYNTHROID, LEVOTHROID) 88 MCG tablet TAKE 1 TABLET BY MOUTH ONCE DAILY 90 tablet 1  . meclizine (ANTIVERT) 25 MG tablet TAKE ONE TABLET BY MOUTH THREE TIMES DAILY AS NEEDED FOR DIZZINESS 90 tablet 1  . niacin (NIASPAN) 500 MG CR tablet Take 500 mg by mouth daily.     . nitroGLYCERIN (NITROSTAT) 0.4 MG SL tablet DISSOLVE ONE TABLET UNDER THE TONGUE EVERY 5 MINUTES AS NEEDED FOR CHEST PAIN. 100 tablet 3  . omeprazole (PRILOSEC) 40 MG capsule Take 1 capsule (40 mg total) by mouth daily. 90 capsule 3  . Rivaroxaban (XARELTO PO) Take by mouth.    . telmisartan (MICARDIS) 80 MG  tablet Take 1 tablet by mouth once daily 90 tablet 1   No current facility-administered medications for this visit.      Past Medical History:  Diagnosis Date  . BPH (benign prostatic hyperplasia)   . CAD (coronary artery disease)   . Cataract    bilateral cataracts removed  . COPD (chronic obstructive pulmonary disease) ([place])   . CVA (cerebral infarction)   . Hyperlipidemia   . Hypertension   . Hypothyroid   . Renal insufficiency   . Ulcer 1992   HX peptic ulcer- DX: by endoscopy    Past Surgical History:  Procedure Laterality Date  . arm surgery    . CORONARY STENT PLACEMENT  04/2011   LAD, [facility]  . EYE SURGERY    . HERNIA MESH REMOVAL    . [place]  . TRANSURETHRAL RESECTION OF PROSTATE      Social History   Socioeconomic History  . Marital status: Married    Spouse name: Not on file  . Number of children: 3  . Years of education: Not on file  . Highest education level: Not on file  Occupational History  . Not on file  Social Needs  . Financial resource strain: Not on file  . Food insecurity:    Worry: Not on file    Inability: Not on file  . Transportation needs:    Medical: Not on file    Non-medical: Not on file  Tobacco Use  .  Smoking status: Former Research scientist (life sciences)  . Smokeless tobacco: Never Used  Substance and Sexual Activity  . Alcohol use: No  . Drug use: No  . Sexual activity: Not on file  Lifestyle  . Physical activity:    Days per week: Not on file    Minutes per session: Not on file  . Stress: Not on file  Relationships  . Social connections:    Talks on phone: Not on file    Gets together: Not on file    Attends religious service: Not on file    Active member of club or organization: Not on file    Attends meetings of clubs or organizations: Not on file    Relationship status: Not on file  . Intimate partner violence:    Fear of current or ex partner: Not on file    Emotionally abused: Not on  file    Physically abused: Not on file    Forced sexual activity: Not on file  Other Topics Concern  . Not on file  Social History Narrative  . Not on file    Family History  Problem Relation Age of Onset  . Heart disease Father   . Coronary artery disease Brother   . Heart attack Brother 36       pacemaker and defibrillator  . Coronary artery disease Brother   . Hypertension Other        family history of  . Heart disease Sister     ROS: no fevers or chills, productive cough, hemoptysis, dysphasia, odynophagia, melena, hematochezia, dysuria, hematuria, rash, seizure activity, orthopnea, PND, pedal edema, claudication. Remaining systems are negative.  Physical Exam: Well-developed well-nourished in no acute distress.  Skin is warm and dry.  HEENT is normal.  Neck is supple.  Chest is clear to auscultation with normal expansion.  Cardiovascular exam is regular rate and rhythm.  Abdominal exam nontender or distended. No masses palpated. Extremities show no edema. neuro grossly intact   A/P  1 coronary artery disease-plan to continue medical therapy.  He is not on aspirin given need for anticoagulation.  He is intolerant to statins.    2 paroxysmal atrial fibrillation-continue present dose of amiodarone (reduced to 100 mg daily due to bradycardia).  Continue apixaban.  His bradycardia has improved with lower dose amiodarone.  Check chest x-ray, TSH and liver functions.  3 hypertension-patient's blood pressure is elevated; he will check at home and we will increase amlodipine to 10 mg daily if needed.  4 bradycardia-heart rate improved with lower dose amiodarone.  No symptoms.  We will follow.  5 hyperlipidemia-patient is intolerant to Zetia or statins and is on willing to consider Repatha.  6 chronic stage III kidney disease-followed by nephrology.  Kirk Ruths, MD

## 2019-01-18 ENCOUNTER — Other Ambulatory Visit: Payer: Self-pay | Admitting: Family Medicine

## 2019-01-18 ENCOUNTER — Ambulatory Visit (INDEPENDENT_AMBULATORY_CARE_PROVIDER_SITE_OTHER): Payer: PPO | Admitting: Cardiology

## 2019-01-18 ENCOUNTER — Encounter: Payer: Self-pay | Admitting: Cardiology

## 2019-01-18 ENCOUNTER — Ambulatory Visit (INDEPENDENT_AMBULATORY_CARE_PROVIDER_SITE_OTHER): Payer: PPO

## 2019-01-18 ENCOUNTER — Other Ambulatory Visit: Payer: Self-pay

## 2019-01-18 VITALS — BP 153/61 | HR 49 | Ht 68.0 in | Wt 165.4 lb

## 2019-01-18 DIAGNOSIS — I10 Essential (primary) hypertension: Secondary | ICD-10-CM

## 2019-01-18 DIAGNOSIS — I480 Paroxysmal atrial fibrillation: Secondary | ICD-10-CM

## 2019-01-18 DIAGNOSIS — I2510 Atherosclerotic heart disease of native coronary artery without angina pectoris: Secondary | ICD-10-CM | POA: Diagnosis not present

## 2019-01-18 DIAGNOSIS — Z79899 Other long term (current) drug therapy: Secondary | ICD-10-CM | POA: Diagnosis not present

## 2019-01-18 DIAGNOSIS — E7800 Pure hypercholesterolemia, unspecified: Secondary | ICD-10-CM

## 2019-01-18 NOTE — Patient Instructions (Signed)
Medication Instructions:  NO CHANGE If you need a refill on your cardiac medications before your next appointment, please call your pharmacy.   Lab work: Your physician recommends that you HAVE LAB WORK TODAY If you have labs (blood work) drawn today and your tests are completely normal, you will receive your results only by: Marland Kitchen MyChart Message (if you have MyChart) OR . A paper copy in the mail If you have any lab test that is abnormal or we need to change your treatment, we will call you to review the results.  Testing/Procedures: A chest x-ray takes a picture of the organs and structures inside the chest, including the heart, lungs, and blood vessels. This test can show several things, including, whether the heart is enlarges; whether fluid is building up in the lungs; and whether pacemaker / defibrillator leads are still in place. [place] OFFICE  Follow-Up: Your physician wants you to follow-up in: [place] will receive a reminder letter in the mail two months in advance. If you don't receive a letter, please call our office to schedule the follow-up appointment.    CALL IN July TO SCHEDULE APPOINTMENT IN [place]

## 2019-01-19 LAB — TSH: TSH: 2.22 m[IU]/L (ref 0.40–4.50)

## 2019-01-19 LAB — HEPATIC FUNCTION PANEL
AG Ratio: 1.9 (calc) (ref 1.0–2.5)
ALKALINE PHOSPHATASE (APISO): 120 U/L (ref 35–144)
ALT: 7 U/L — ABNORMAL LOW (ref 9–46)
AST: 11 U/L (ref 10–35)
Albumin: 4.1 g/dL (ref 3.6–5.1)
Bilirubin, Direct: 0.1 mg/dL (ref 0.0–0.2)
Globulin: 2.2 g/dL (calc) (ref 1.9–3.7)
Indirect Bilirubin: 0.2 mg/dL (calc) (ref 0.2–1.2)
Total Bilirubin: 0.3 mg/dL (ref 0.2–1.2)
Total Protein: 6.3 g/dL (ref 6.1–8.1)

## 2019-03-11 ENCOUNTER — Other Ambulatory Visit: Payer: Self-pay | Admitting: Family Medicine

## 2019-03-11 DIAGNOSIS — E039 Hypothyroidism, unspecified: Secondary | ICD-10-CM

## 2019-03-20 DIAGNOSIS — N183 Chronic kidney disease, stage 3 (moderate): Secondary | ICD-10-CM | POA: Diagnosis not present

## 2019-04-07 ENCOUNTER — Telehealth: Payer: Self-pay | Admitting: Cardiology

## 2019-04-07 DIAGNOSIS — R42 Dizziness and giddiness: Secondary | ICD-10-CM

## 2019-04-07 NOTE — Telephone Encounter (Signed)
Called patient to get more information about their condition. The phone line was busy, not able to leave message.

## 2019-04-07 NOTE — Telephone Encounter (Signed)
Wife called and states that the patient has been extremely fatigued for the past week. Pt changed from  [place] to eliquis in Feb, and the wife is wondering if that could be a cause of fatigue. BP has been fine otherwise

## 2019-04-10 NOTE — Telephone Encounter (Signed)
Spoke with the pt this this morning re: his wife's call on Friday 04/07/19.. he reports that he has been very tired and weak.. he has not been able to do much during the day since he feels so weak.Marland Kitchen He denies SOB and dizziness. But has h/o vertigo which has not been worsened lately. He denies chest pain, headache, numbness in his extremities. He is unaware of his BP but HR is staying in the 40's.. he says his appetite has been good. He has also ben drinking enough fluids.   Pt is worried that it could be his Eliquis since it seems that is when he noticed this problem. (12/2018)  I have asked the pt to try and check his BO and HR regularly and record it. I have also recommended that he call his [facility] Dr. Madilyn Fireman.. he has an OV 03/31/19 but may need to be seen sooner.. he has H/O low B12 whch he takes oral supplements for.   Will forward to Dr. [name] for his review.

## 2019-04-10 NOTE — Telephone Encounter (Signed)
Spoke with pt wife, Aware of dr Jacalyn Lefevre recommendations. They will not come to [place] to be seen or have an EKG, she reports that is too far. Paperwork for lab work mailed to the patient and order placed for monitor. The patient has a follow up appointment with dr Madilyn Fireman in [place] 05-01-2019 and will have an EKG then.

## 2019-04-10 NOTE — Telephone Encounter (Signed)
Left message for pt to call.

## 2019-04-10 NOTE — Telephone Encounter (Signed)
Check CBC and 24 hour holter; schedule fuov with APP to check ECG. Follow BP Kirk Ruths

## 2019-04-12 ENCOUNTER — Telehealth: Payer: Self-pay | Admitting: Radiology

## 2019-04-12 NOTE — Telephone Encounter (Signed)
Enrolled patient for a 3 day Zio monitor to be mailed. Brief instructions were gone over with patients wife and she knows to expect the monitor to arrive in 3-4 days

## 2019-04-17 ENCOUNTER — Ambulatory Visit (INDEPENDENT_AMBULATORY_CARE_PROVIDER_SITE_OTHER): Payer: PPO

## 2019-04-17 DIAGNOSIS — R42 Dizziness and giddiness: Secondary | ICD-10-CM

## 2019-04-24 ENCOUNTER — Ambulatory Visit: Payer: PPO | Admitting: Family Medicine

## 2019-04-28 ENCOUNTER — Telehealth: Payer: Self-pay | Admitting: Cardiology

## 2019-04-28 DIAGNOSIS — R42 Dizziness and giddiness: Secondary | ICD-10-CM | POA: Diagnosis not present

## 2019-04-28 NOTE — Telephone Encounter (Signed)
DC amiodarone; schedule fuov with me or pa next week; call with any further dizziness; pull strips for my review Kirk Ruths

## 2019-04-28 NOTE — Telephone Encounter (Signed)
The wife called back to discuss the patient's symptom. She stated that the patient had a "vertigo" attack and was dizzy but otherwise is feeling okay. He has been active without any problems. She stated that he is not as tried as he was. The patient was currently out and not available.   His wife stated that he has kept a blood pressure log but did not have access to it. The patient will call back with those readings.   The patient has an appointment on Monday for an EKG with his PCP  The reading has been placed in the provider's basket.

## 2019-04-28 NOTE — Telephone Encounter (Signed)
  Needs to report an abnormal zio patch

## 2019-04-28 NOTE — Telephone Encounter (Signed)
[facility] called and stated that the patient had two episodes of symptomatic bradycardia with heart rates of 37 bpm that lasted 10 seconds each.  Left a message for the patient to call back to check on his symptoms. According to his chart, the monitor was placed due to symptomatic bradycardia with heart rates in the 40's.

## 2019-05-01 ENCOUNTER — Ambulatory Visit (INDEPENDENT_AMBULATORY_CARE_PROVIDER_SITE_OTHER): Payer: PPO | Admitting: Family Medicine

## 2019-05-01 ENCOUNTER — Encounter: Payer: Self-pay | Admitting: Family Medicine

## 2019-05-01 VITALS — BP 140/41 | HR 50 | Ht 68.0 in | Wt 165.0 lb

## 2019-05-01 DIAGNOSIS — R001 Bradycardia, unspecified: Secondary | ICD-10-CM | POA: Diagnosis not present

## 2019-05-01 DIAGNOSIS — I10 Essential (primary) hypertension: Secondary | ICD-10-CM

## 2019-05-01 DIAGNOSIS — D649 Anemia, unspecified: Secondary | ICD-10-CM | POA: Diagnosis not present

## 2019-05-01 DIAGNOSIS — I480 Paroxysmal atrial fibrillation: Secondary | ICD-10-CM

## 2019-05-01 DIAGNOSIS — T887XXA Unspecified adverse effect of drug or medicament, initial encounter: Secondary | ICD-10-CM | POA: Diagnosis not present

## 2019-05-01 DIAGNOSIS — N184 Chronic kidney disease, stage 4 (severe): Secondary | ICD-10-CM

## 2019-05-01 DIAGNOSIS — R42 Dizziness and giddiness: Secondary | ICD-10-CM | POA: Diagnosis not present

## 2019-05-01 DIAGNOSIS — E538 Deficiency of other specified B group vitamins: Secondary | ICD-10-CM

## 2019-05-01 DIAGNOSIS — J449 Chronic obstructive pulmonary disease, unspecified: Secondary | ICD-10-CM | POA: Diagnosis not present

## 2019-05-01 NOTE — Assessment & Plan Note (Signed)
Due to recheck renal function. 

## 2019-05-01 NOTE — Assessment & Plan Note (Signed)
Having side effects on the Eliquis.  He is only taking 1 a day which is concerning but does have an appointment coming up in the next week or 2 with cardiology to discuss this.  Likely try different medication.

## 2019-05-01 NOTE — Telephone Encounter (Signed)
Left detailed message for wife, patient is to stop amiodarone. Will keep trying to reach the patient to schedule follow up appt.

## 2019-05-01 NOTE — Telephone Encounter (Signed)
Patient has a follow up with dr Madilyn Fireman this morning, Left message for pt to call

## 2019-05-01 NOTE — Progress Notes (Signed)
Pt's wife advised that he had been feeling tired and moving around slower.  She called his cardiologist and he received a heart monitor. He was advised by cardiology to have an EKG done today w/Dr. Metheney(they would like [place] to fax the results of this) and he has an order for a BMP also.  Maryruth Eve, [place]ahoma Crocker, CMA

## 2019-05-01 NOTE — Progress Notes (Signed)
Established Patient Office Visit  Subjective:  Patient ID: Roberto Fields, male    DOB: 03-26-1946  Age: 73 y.o. MRN: [id number]  CC:  Chief Complaint  Patient presents with  . Hypertension    HPI Roberto Fields presents for   Hypertension- Pt denies chest pain, SOB, or heart palpitations.  He does get occasional dizziness but says he is never sure if it is really his vertigo or dizziness.  Taking meds as directed w/o problems.  Denies medication side effects.    More recently he was switched to Eliquis off of Xarelto because of easy bruising.  In regards to the bruising he is actually done much better on Xarelto but says he just felt extremely weak and tired on it to the point where after a few days he actually went down to 1 tab and says he felt a little bit better but still just feels really washed out and tired on it.  He did call cardiology and let them know and they do have him scheduled for a visit in the next couple of weeks.  They did asked that we go ahead and do an EKG while he was here today  F/U CKD  3- doing well overall. Due to recheck kidney function.   F/U COPD - no recetn flares or exacerbations.    B12 deficiency. = he is supposed to be on an OTC supplement.    Past Medical History:  Diagnosis Date  . BPH (benign prostatic hyperplasia)   . CAD (coronary artery disease)   . Cataract    bilateral cataracts removed  . COPD (chronic obstructive pulmonary disease) ([place])   . CVA (cerebral infarction)   . Hyperlipidemia   . Hypertension   . Hypothyroid   . Renal insufficiency   . Ulcer 1992   HX peptic ulcer- DX: by endoscopy    Past Surgical History:  Procedure Laterality Date  . arm surgery    . CORONARY STENT PLACEMENT  04/2011   [facility], [facility]  . EYE SURGERY    . HERNIA MESH REMOVAL    . [place]  . TRANSURETHRAL RESECTION OF PROSTATE      Family History  Problem Relation Age of Onset  . Heart disease  Father   . Coronary artery disease Brother   . Heart attack Brother 36       pacemaker and defibrillator  . Coronary artery disease Brother   . Hypertension Other        family history of  . Heart disease Sister     Social History   Socioeconomic History  . Marital status: Married    Spouse name: Not on file  . Number of children: 3  . Years of education: Not on file  . Highest education level: Not on file  Occupational History  . Not on file  Social Needs  . Financial resource strain: Not on file  . Food insecurity    Worry: Not on file    Inability: Not on file  . Transportation needs    Medical: Not on file    Non-medical: Not on file  Tobacco Use  . Smoking status: Former Research scientist (life sciences)  . Smokeless tobacco: Never Used  Substance and Sexual Activity  . Alcohol use: No  . Drug use: No  . Sexual activity: Not on file  Lifestyle  . Physical activity    Days per week: Not on file  Minutes per session: Not on file  . Stress: Not on file  Relationships  . Social Herbalist on phone: Not on file    Gets together: Not on file    Attends religious service: Not on file    Active member of club or organization: Not on file    Attends meetings of clubs or organizations: Not on file    Relationship status: Not on file  . Intimate partner violence    Fear of current or ex partner: Not on file    Emotionally abused: Not on file    Physically abused: Not on file    Forced sexual activity: Not on file  Other Topics Concern  . Not on file  Social History Narrative  . Not on file    Outpatient Medications Prior to Visit  Medication Sig Dispense Refill  . albuterol (PROVENTIL HFA;VENTOLIN HFA) 108 (90 Base) MCG/ACT inhaler Inhale 2 puffs into the lungs every 6 (six) hours as needed for wheezing. 18 g 4  . amiodarone (PACERONE) 100 MG tablet Take 1 tablet (100 mg total) by mouth daily. 90 tablet 3  . amLODipine (NORVASC) 5 MG tablet Take 1 tablet (5 mg total) by  mouth daily. 90 tablet 3  . ELIQUIS 5 MG TABS tablet Take 5 mg by mouth 2 (two) times daily.    Roberto Fields Kitchen gabapentin (NEURONTIN) 300 MG capsule Take 300 mg by mouth 3 (three) times daily.    Roberto Fields Kitchen levothyroxine (SYNTHROID) 88 MCG tablet Take 1 tablet by mouth once daily 90 tablet 0  . meclizine (ANTIVERT) 25 MG tablet TAKE ONE TABLET BY MOUTH THREE TIMES DAILY AS NEEDED FOR DIZZINESS 90 tablet 1  . niacin (NIASPAN) 500 MG CR tablet Take 500 mg by mouth daily.     . nitroGLYCERIN (NITROSTAT) 0.4 MG SL tablet DISSOLVE ONE TABLET UNDER THE TONGUE EVERY 5 MINUTES AS NEEDED FOR CHEST PAIN. 100 tablet 3  . omeprazole (PRILOSEC) 40 MG capsule Take 1 capsule (40 mg total) by mouth daily. 90 capsule 3  . telmisartan (MICARDIS) 80 MG tablet Take 1 tablet by mouth once daily 90 tablet 1   No facility-administered medications prior to visit.     Allergies  Allergen Reactions  . Aspirin   . Atorvastatin     REACTION: Muscle weakness  . Colesevelam     REACTION: joint pain  . Nsaids Other (See Comments)    Avoid with current kidney function.    . Pravastatin Other (See Comments)    Muscle aches  . Simvastatin     REACTION: Myalgias    ROS Review of Systems    Objective:    Physical Exam  BP (!) 140/41   Pulse (!) 50   Ht 5\' 8"  (1.727 m)   Wt 165 lb (74.8 kg)   SpO2 98%   BMI 25.09 kg/m  Wt Readings from Last 3 Encounters:  05/01/19 165 lb (74.8 kg)  01/18/19 165 lb 6.4 oz (75 kg)  12/19/18 165 lb (74.8 kg)     Health Maintenance Due  Topic Date Due  . COLONOSCOPY  04/30/2019    There are no preventive care reminders to display for this patient.  Lab Results  Component Value Date   TSH 2.22 01/18/2019   Lab Results  Component Value Date   WBC 4.9 12/19/2018   HGB 10.3 (L) 12/19/2018   HCT 30.3 (L) 12/19/2018   MCV 101.3 (H) 12/19/2018   PLT 147 12/19/2018  Lab Results  Component Value Date   NA 141 12/19/2018   K 5.1 12/19/2018   CO2 25 12/19/2018   GLUCOSE 90  12/19/2018   BUN 26 (H) 12/19/2018   CREATININE 2.06 (H) 12/19/2018   BILITOT 0.3 01/18/2019   ALKPHOS 102 01/05/2017   AST 11 01/18/2019   ALT 7 (L) 01/18/2019   PROT 6.3 01/18/2019   ALBUMIN 3.8 08/19/2016   CALCIUM 9.1 12/19/2018   Lab Results  Component Value Date   CHOL 185 08/05/2018   Lab Results  Component Value Date   HDL 37 (L) 08/05/2018   Lab Results  Component Value Date   LDLCALC 126 (H) 08/05/2018   Lab Results  Component Value Date   TRIG 113 08/05/2018   Lab Results  Component Value Date   CHOLHDL 5.0 (H) 08/05/2018   Lab Results  Component Value Date   HGBA1C 5.5 08/26/2015      Assessment & Plan:   Problem List Items Addressed This Visit      Cardiovascular and Mediastinum   HYPERTENSION, BENIGN ESSENTIAL    Blood pressure up a little bit but home blood pressure log looks fantastic.  Though there is some variation with the lowest being 270 systolic and the highest being 141 but most of them are actually in the 120s.  Pulse is consistently in the 40s to 50s.      Atrial fibrillation ([place])    Having side effects on the Eliquis.  He is only taking 1 a day which is concerning but does have an appointment coming up in the next week or 2 with cardiology to discuss this.  Likely try different medication.        Respiratory   COPD (chronic obstructive pulmonary disease) (HCC)    No recent flares or exacerbations.  Last monitoring was actually about 4 years ago.  But with COVID going on and no worsening of his symptoms we will hold off on scheduling more up-to-date spirometry testing        Genitourinary   CKD (chronic kidney disease) stage 4, GFR 15-29 ml/min (HCC)    Due to recheck renal function.       Other Visit Diagnoses    Bradycardia    -  Primary   Relevant Orders   EKG 12-Lead   Medication side effect       Low serum vitamin B12          Bradycardia-EKG does confirm bradycardia which is consistent with the pulse that he is  been getting at home.  Comparing to previous EKG from December it is unchanged.  Low B12 - he is overdue to recheck his labs from Feb. He will go today.    No orders of the defined types were placed in this encounter.   Follow-up: Return in about 6 months (around 10/31/2019) for medication followup. Beatrice Lecher, MD

## 2019-05-01 NOTE — Assessment & Plan Note (Signed)
Blood pressure up a little bit but home blood pressure log looks fantastic.  Though there is some variation with the lowest being 397 systolic and the highest being 141 but most of them are actually in the 120s.  Pulse is consistently in the 40s to 50s.

## 2019-05-01 NOTE — Assessment & Plan Note (Signed)
No recent flares or exacerbations.  Last monitoring was actually about 4 years ago.  But with COVID going on and no worsening of his symptoms we will hold off on scheduling more up-to-date spirometry testing

## 2019-05-02 ENCOUNTER — Other Ambulatory Visit: Payer: Self-pay | Admitting: Family Medicine

## 2019-05-02 ENCOUNTER — Other Ambulatory Visit: Payer: Self-pay | Admitting: Cardiology

## 2019-05-02 ENCOUNTER — Other Ambulatory Visit: Payer: Self-pay

## 2019-05-02 ENCOUNTER — Other Ambulatory Visit: Payer: Self-pay | Admitting: *Deleted

## 2019-05-02 DIAGNOSIS — R42 Dizziness and giddiness: Secondary | ICD-10-CM

## 2019-05-02 DIAGNOSIS — D649 Anemia, unspecified: Secondary | ICD-10-CM

## 2019-05-02 LAB — BASIC METABOLIC PANEL
BUN/Creatinine Ratio: 12 (calc) (ref 6–22)
BUN: 28 mg/dL — ABNORMAL HIGH (ref 7–25)
CO2: 25 mmol/L (ref 20–32)
Calcium: 9.3 mg/dL (ref 8.6–10.3)
Chloride: 109 mmol/L (ref 98–110)
Creat: 2.3 mg/dL — ABNORMAL HIGH (ref 0.70–1.18)
Glucose, Bld: 87 mg/dL (ref 65–99)
Potassium: 5.1 mmol/L (ref 3.5–5.3)
Sodium: 140 mmol/L (ref 135–146)

## 2019-05-02 LAB — CBC WITH DIFFERENTIAL/PLATELET
Absolute Monocytes: 368 cells/uL (ref 200–950)
Basophils Absolute: 20 cells/uL (ref 0–200)
Basophils Relative: 0.5 %
Eosinophils Absolute: 68 cells/uL (ref 15–500)
Eosinophils Relative: 1.7 %
HCT: 28.4 % — ABNORMAL LOW (ref 38.5–50.0)
Hemoglobin: 9.4 g/dL — ABNORMAL LOW (ref 13.2–17.1)
Lymphs Abs: 976 cells/uL (ref 850–3900)
MCH: 33.8 pg — ABNORMAL HIGH (ref 27.0–33.0)
MCHC: 33.1 g/dL (ref 32.0–36.0)
MCV: 102.2 fL — ABNORMAL HIGH (ref 80.0–100.0)
MPV: 12.3 fL (ref 7.5–12.5)
Monocytes Relative: 9.2 %
Neutro Abs: 2568 cells/uL (ref 1500–7800)
Neutrophils Relative %: 64.2 %
Platelets: 116 10*3/uL — ABNORMAL LOW (ref 140–400)
RBC: 2.78 10*6/uL — ABNORMAL LOW (ref 4.20–5.80)
RDW: 12.8 % (ref 11.0–15.0)
Total Lymphocyte: 24.4 %
WBC: 4 10*3/uL (ref 3.8–10.8)

## 2019-05-02 LAB — VITAMIN B12: Vitamin B-12: 677 pg/mL (ref 200–1100)

## 2019-05-02 NOTE — Telephone Encounter (Signed)
Spoke with pt, the patient has stopped the amiodarone. Follow up scheduled with dr [name].

## 2019-05-04 ENCOUNTER — Telehealth: Payer: Self-pay | Admitting: Hematology

## 2019-05-04 NOTE — Telephone Encounter (Signed)
sw patient wife to confirm new patient appt 7/6 at 0830

## 2019-05-05 ENCOUNTER — Other Ambulatory Visit: Payer: Self-pay | Admitting: Family Medicine

## 2019-05-05 DIAGNOSIS — H16223 Keratoconjunctivitis sicca, not specified as Sjogren's, bilateral: Secondary | ICD-10-CM | POA: Diagnosis not present

## 2019-05-05 DIAGNOSIS — H401133 Primary open-angle glaucoma, bilateral, severe stage: Secondary | ICD-10-CM | POA: Diagnosis not present

## 2019-05-09 LAB — POC HEMOCCULT BLD/STL (HOME/3-CARD/SCREEN)
Card #2 Fecal Occult Blod, POC: POSITIVE
Card #3 Fecal Occult Blood, POC: POSITIVE
Fecal Occult Blood, POC: POSITIVE — AB

## 2019-05-09 NOTE — Progress Notes (Deleted)
HPI: FUCAD. Previously cared for at [facility]. Catheterization 2013 showed a 99% obtuse marginal and 70% PDA. Patient had PCI of his marginal. Note based on records his disease was predominantly distal and not amenable to revascularization. Carotid Doppler September 2014 showed 1-39% bilateral stenosis. Echocardiogram September 2014 showed normal LV systolic function, Mild left ventricular hypertrophy and mild aortic insufficiency. Patient also with history of atrial fibrillation.Nuclear study September 2017 showed mild inferolateral ischemia and ejection fraction 52%. Study felt to be low risk and we are treating medically.Renal ultrasound 2016 showed no aneurysm. Recent monitor May 02, 2019 showed sinus bradycardia with PACs, PVCs and brief PAT.  Heart rate as low as 37.  Amiodarone discontinued.  Since last seen,  Current Outpatient Medications  Medication Sig Dispense Refill  . albuterol (PROVENTIL HFA;VENTOLIN HFA) 108 (90 Base) MCG/ACT inhaler Inhale 2 puffs into the lungs every 6 (six) hours as needed for wheezing. 18 g 4  . amiodarone (PACERONE) 100 MG tablet Take 1 tablet (100 mg total) by mouth daily. 90 tablet 3  . amLODipine (NORVASC) 5 MG tablet Take 1 tablet (5 mg total) by mouth daily. 90 tablet 3  . ELIQUIS 5 MG TABS tablet Take 5 mg by mouth 2 (two) times daily.    Marland Kitchen gabapentin (NEURONTIN) 300 MG capsule Take 300 mg by mouth 3 (three) times daily.    Marland Kitchen levothyroxine (SYNTHROID) 88 MCG tablet Take 1 tablet by mouth once daily 90 tablet 0  . meclizine (ANTIVERT) 25 MG tablet TAKE 1 TABLET BY MOUTH THREE TIMES DAILY AS NEEDED FOR DIZZINESS 90 tablet 0  . niacin (NIASPAN) 500 MG CR tablet Take 500 mg by mouth daily.     . nitroGLYCERIN (NITROSTAT) 0.4 MG SL tablet DISSOLVE ONE TABLET UNDER THE TONGUE EVERY 5 MINUTES AS NEEDED FOR CHEST PAIN 100 tablet prn  . omeprazole (PRILOSEC) 40 MG capsule Take 1 capsule (40 mg total) by mouth daily. 90 capsule 3  .  telmisartan (MICARDIS) 80 MG tablet Take 1 tablet by mouth once daily 90 tablet 1   No current facility-administered medications for this visit.      Past Medical History:  Diagnosis Date  . BPH (benign prostatic hyperplasia)   . CAD (coronary artery disease)   . Cataract    bilateral cataracts removed  . COPD (chronic obstructive pulmonary disease) ([place])   . CVA (cerebral infarction)   . Hyperlipidemia   . Hypertension   . Hypothyroid   . Renal insufficiency   . Ulcer 1992   HX peptic ulcer- DX: by endoscopy    Past Surgical History:  Procedure Laterality Date  . arm surgery    . CORONARY STENT PLACEMENT  04/2011   [facility]AD, [facility], [facility]  . EYE SURGERY    . HERNIA MESH REMOVAL    . [place]  . TRANSURETHRAL RESECTION OF PROSTATE      Social History   Socioeconomic History  . Marital status: Married    Spouse name: Not on file  . Number of children: 3  . Years of education: Not on file  . Highest education level: Not on file  Occupational History  . Not on file  Social Needs  . Financial resource strain: Not on file  . Food insecurity    Worry: Not on file    Inability: Not on file  . Transportation needs    Medical: Not on file  Non-medical: Not on file  Tobacco Use  . Smoking status: Former Research scientist (life sciences)  . Smokeless tobacco: Never Used  Substance and Sexual Activity  . Alcohol use: No  . Drug use: No  . Sexual activity: Not on file  Lifestyle  . Physical activity    Days per week: Not on file    Minutes per session: Not on file  . Stress: Not on file  Relationships  . Social Herbalist on phone: Not on file    Gets together: Not on file    Attends religious service: Not on file    Active member of club or organization: Not on file    Attends meetings of clubs or organizations: Not on file    Relationship status: Not on file  . Intimate partner violence    Fear of current or ex partner: Not on file     Emotionally abused: Not on file    Physically abused: Not on file    Forced sexual activity: Not on file  Other Topics Concern  . Not on file  Social History Narrative  . Not on file    Family History  Problem Relation Age of Onset  . Heart disease Father   . Coronary artery disease Brother   . Heart attack Brother 36       pacemaker and defibrillator  . Coronary artery disease Brother   . Hypertension Other        family history of  . Heart disease Sister     ROS: no fevers or chills, productive cough, hemoptysis, dysphasia, odynophagia, melena, hematochezia, dysuria, hematuria, rash, seizure activity, orthopnea, PND, pedal edema, claudication. Remaining systems are negative.  Physical Exam: Well-developed well-nourished in no acute distress.  Skin is warm and dry.  HEENT is normal.  Neck is supple.  Chest is clear to auscultation with normal expansion.  Cardiovascular exam is regular rate and rhythm.  Abdominal exam nontender or distended. No masses palpated. Extremities show no edema. neuro grossly intact  ECG- personally reviewed  A/P  1 bradycardia-recent monitor showed heart rate as low as 37.  Amiodarone was discontinued.  2 paroxysmal atrial fibrillation-we will follow for recurrent atrial fibrillation.  Continue apixaban at present dose.  3 coronary artery disease-patient denies chest pain.  He is not on aspirin given need for apixaban.  He is intolerant to statins.  4 hypertension-  5 hyperlipidemia-patient is intolerant to statins and Zetia.  He does not want to consider Repatha.  6 chronic stage III kidney disease-monitored by nephrology.  Kirk Ruths, MD

## 2019-05-10 ENCOUNTER — Ambulatory Visit: Payer: PPO | Admitting: Cardiology

## 2019-05-10 ENCOUNTER — Other Ambulatory Visit: Payer: Self-pay | Admitting: Hematology

## 2019-05-10 DIAGNOSIS — D539 Nutritional anemia, unspecified: Secondary | ICD-10-CM

## 2019-05-10 DIAGNOSIS — D696 Thrombocytopenia, unspecified: Secondary | ICD-10-CM | POA: Insufficient documentation

## 2019-05-10 NOTE — Progress Notes (Signed)
[facility] NOTE  Patient Care Team: [name], MD as PCP - General [name], MD as Referring Physician (Cardiology) [name], MD as Referring Physician (Nephrology)  HEME/ONC OVERVIEW: 1. Macrocytic anemia -Most likely multifactorial, including Stage IV CKD and hypothyroidism -Work-up for CKD negative, including SPEP, IFE, UPEP and UFE in 2016  -Hgb slowly downtrending since 2019, currently between 9 and 10   2. Mild thrombocytopenia   PERTINENT NON-HEM/ONC PROBLEMS: 1. 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 A-fib on amiodarone and Eliquis   ASSESSMENT & PLAN:   Macrocytic anemia -I reviewed the patient's records in detail, including PCP and nephrology clinic notes and lab studies -In summary, patient has had longstanding macrocytic anemia dating back to at least 2017.  Hemoglobin remained relatively stable between 10 and 11 until late 2019, when it began to slowly downtrend and most recently was 9.4 in late 04/2019.  He also has advanced CKD dating back to at least 2014, and is followed closely by nephrology at [facility].  His previous extensive work-up for CKD, including plasma cell dyscrasia, was negative, and his CKD was thought to be due to longstanding HTN. B12 level was normal in 04/2019 and folate nl in 12/2018. He takes Synthroid for hypothroidism, and TSH was normal in 01/2019. -Clinically, patient denies any symptoms of bleeding  -Hgb 9.3 today, stable -I personally reviewed the patient's peripheral blood smear, which showed borderline macrocytic RBC's with some central hypochromia, suggestive of a component of iron deficiency. There were a few echinocytes, which likely reflect uremia from underlying CKD. WBC and platelet morphology was relatively normal.  -I have ordered iron profile, including soluble transferrin, copper level, B12  and folate levels to rule out other nutritional deficiencies -Given the slowly downtrending anemia, I suspect that this is most likely due to progressive CKD  -Patient's last colonoscopy and EGD were over 10 years ago, and review of the gastric biopsy showed inflammatory changes; as he is on anticoagulation for A-fib, he is at increased risk of bleeding, and would benefit from GI evaluation to rule out any occult bleeding that can lead to worsening anemia -I offered to refer patient to GI for further evaluation, but the patient and his wife declined at this time, and would like to talk to their PCP before making a referral; I informed the patient that if they change their mind, his PCP could make a referral to gastroenterology for consideration of EGD and/or colonoscopy -Continue B12 supplement for now   Mild thrombocytopenia  -Plts 116 in 04/2019, new -Plts 150k today, just above the lower limit of normal -Peripheral blood smear results as above -I have ordered HIV, Hep B/C serologies to rule out any infection -Given the improvement in plt count, I will hold off abdominal US for now -I counseled the patient on any concerning symptoms, such as abnormal bleeding, including hematemesis, hemoptysis, hematochezia, melena, or persistent epistaxis, or excess bruising, for which he should seek care promptly  Hyperkalemia -Secondary to Stage IV CKD -K 5.7 today; patient is asymptomatic -I have prescribed Kayexalate 30g daily x 3 days, and emphasized to the patient that he must follow up with his PCP before the end of the week for repeat electrolytes -I have also sent a message to his PCP to discuss the plan  Elevated PT -PT 17 and INR 1.4; PTT normal -As the patient is  on Eliquis for A-fib, this is most likely due to anticoagulation -Patient denies any symptoms of bleeding -No further work-up indicated at this time   Orders Placed This Encounter  Procedures  . Vitamin B12    Standing Status:    Future    Standing Expiration Date:   06/18/2020  . Folate    Standing Status:   Future    Standing Expiration Date:   06/18/2020  . CBC with Differential (Cancer Center Only)    Standing Status:   Future    Standing Expiration Date:   06/18/2020  . CMP ([place] only)    Standing Status:   Future    Standing Expiration Date:   06/18/2020  . Save Smear (SSMR)    Standing Status:   Future    Standing Expiration Date:   05/14/2020  . Reticulocytes    Standing Status:   Future    Standing Expiration Date:   06/18/2020   All questions were answered. The patient knows to call the clinic with any problems, questions or concerns.  Return in 2 months for labs and clinic follow-up.   [name], MD 05/15/2019 9:55 AM   CHIEF COMPLAINTS/PURPOSE OF CONSULTATION:  "I am told that my red blood is low"  HISTORY OF PRESENTING ILLNESS:  Roberto Fields 73 y.o. male is here because of progressive macrocytic anemia.  Patient was accompanied by his wife due to his baseline cognitive impairment.  He has a history of longstanding microcytic anemia dating back to at least 2017, but he was not aware of it until his recent visit with his PCP.  Routine CBC showed Hgb of 9.4 in late 04/2019, compared with baseline Hgb of 10-11 since late 2019.  Patient is followed by nephrology for his Stage IV CKD, and his previous myeloma work-up is negative.  He reports intermittent fatigue and chronic back pain due to arthritis, for which he has had several surgeries, but he denies any constitutional symptoms, chest pain, dyspnea, abdominal pain, nausea, vomiting, diarrhea, hematochezia, or melena.  He is colonoscopy at age due over 10 years ago.  He does not have a current follow-up with gastroenterology  I have reviewed his chart and materials related to his cancer extensively and collaborated history with the patient. Summary of oncologic history is as follows: Oncology History   No history exists.    MEDICAL HISTORY:   Past Medical History:  Diagnosis Date  . BPH (benign prostatic hyperplasia)   . CAD (coronary artery disease)   . Cataract    bilateral cataracts removed  . COPD (chronic obstructive pulmonary disease) ([place])   . CVA (cerebral infarction)   . Hyperlipidemia   . Hypertension   . Hypothyroid   . Renal insufficiency   . Ulcer 1992   HX peptic ulcer- DX: by endoscopy    SURGICAL HISTORY: Past Surgical History:  Procedure Laterality Date  . arm surgery    . CORONARY STENT PLACEMENT  04/2011   LAD, [facility]  . EYE SURGERY    . HERNIA MESH REMOVAL    . [place]  . TRANSURETHRAL RESECTION OF PROSTATE      SOCIAL HISTORY: Social History   Socioeconomic History  . Marital status: Married    Spouse name: Not on file  . Number of children: 3  . Years of education: Not on file  . Highest education level: Not on file  Occupational History  . Not on  file  Social Needs  . Financial resource strain: Not on file  . Food insecurity    Worry: Not on file    Inability: Not on file  . Transportation needs    Medical: Not on file    Non-medical: Not on file  Tobacco Use  . Smoking status: Former Research scientist (life sciences)  . Smokeless tobacco: Never Used  Substance and Sexual Activity  . Alcohol use: No  . Drug use: No  . Sexual activity: Not on file  Lifestyle  . Physical activity    Days per week: Not on file    Minutes per session: Not on file  . Stress: Not on file  Relationships  . Social Herbalist on phone: Not on file    Gets together: Not on file    Attends religious service: Not on file    Active member of club or organization: Not on file    Attends meetings of clubs or organizations: Not on file    Relationship status: Not on file  . Intimate partner violence    Fear of current or ex partner: Not on file    Emotionally abused: Not on file    Physically abused: Not on file    Forced sexual activity: Not on file  Other Topics  Concern  . Not on file  Social History Narrative  . Not on file    FAMILY HISTORY: Family History  Problem Relation Age of Onset  . Heart disease Father   . Coronary artery disease Brother   . Heart attack Brother 36       pacemaker and defibrillator  . Coronary artery disease Brother   . Hypertension Other        family history of  . Heart disease Sister     ALLERGIES:  is allergic to aspirin; atorvastatin; colesevelam; nsaids; pravastatin; and simvastatin.  MEDICATIONS:  Current Outpatient Medications  Medication Sig Dispense Refill  . albuterol (PROVENTIL HFA;VENTOLIN HFA) 108 (90 Base) MCG/ACT inhaler Inhale 2 puffs into the lungs every 6 (six) hours as needed for wheezing. 18 g 4  . amLODipine (NORVASC) 5 MG tablet Take 1 tablet (5 mg total) by mouth daily. 90 tablet 3  . ELIQUIS 5 MG TABS tablet Take 5 mg by mouth 2 (two) times daily.    . fluorometholone (FML) 0.1 % ophthalmic suspension INSTILL 1 DROP INTO AFFECTED EYE(S) TWICE DAILY    . gabapentin (NEURONTIN) 300 MG capsule Take 300 mg by mouth 3 (three) times daily.    Marland Kitchen levothyroxine (SYNTHROID) 88 MCG tablet Take 1 tablet by mouth once daily 90 tablet 0  . meclizine (ANTIVERT) 25 MG tablet TAKE 1 TABLET BY MOUTH THREE TIMES DAILY AS NEEDED FOR DIZZINESS 90 tablet 0  . niacin (NIASPAN) 500 MG CR tablet Take 500 mg by mouth daily.     . nitroGLYCERIN (NITROSTAT) 0.4 MG SL tablet DISSOLVE ONE TABLET UNDER THE TONGUE EVERY 5 MINUTES AS NEEDED FOR CHEST PAIN 100 tablet prn  . omeprazole (PRILOSEC) 40 MG capsule Take 1 capsule (40 mg total) by mouth daily. 90 capsule 3  . telmisartan (MICARDIS) 80 MG tablet Take 1 tablet by mouth once daily 90 tablet 1  . sodium polystyrene (KAYEXALATE) powder 30g daily x 3 days. Pls follow up with your PCP within the week. 90 g 0   No current facility-administered medications for this visit.     REVIEW OF SYSTEMS:   Constitutional: ( - ) fevers, ( - )  chills , ( - ) night  sweats Eyes: ( - ) blurriness of vision, ( - ) double vision, ( - ) watery eyes Ears, nose, mouth, throat, and face: ( - ) mucositis, ( - ) sore throat Respiratory: ( - ) cough, ( - ) dyspnea, ( - ) wheezes Cardiovascular: ( - ) palpitation, ( - ) chest discomfort, ( - ) lower extremity swelling Gastrointestinal:  ( - ) nausea, ( - ) heartburn, ( - ) change in bowel habits Skin: ( - ) abnormal skin rashes Lymphatics: ( - ) new lymphadenopathy, ( - ) easy bruising Neurological: ( - ) numbness, ( - ) tingling, ( - ) new weaknesses Behavioral/Psych: ( - ) mood change, ( - ) new changes  All other systems were reviewed with the patient and are negative.  PHYSICAL EXAMINATION: ECOG PERFORMANCE STATUS: 2 - Symptomatic, <50% confined to bed  Vitals:   05/15/19 0912  BP: (!) 134/43  Pulse: (!) 47  Resp: 16  SpO2: 100%   Filed Weights   05/15/19 0912  Weight: 165 lb 12.8 oz (75.2 kg)    GENERAL: alert, no distress and comfortable, slightly frail appearing  SKIN: skin color, texture, turgor are normal, no rashes or significant lesions EYES: conjunctiva are pink and non-injected, sclera clear OROPHARYNX: no exudate, no erythema; lips, buccal mucosa, and tongue normal  NECK: supple, non-tender LUNGS: clear to auscultation with normal breathing effort HEART: irregularly irregular, no murmurs, no lower extremity edema ABDOMEN: soft, non-tender, non-distended, normal bowel sounds Musculoskeletal: no cyanosis of digits and no clubbing  PSYCH: alert & oriented x 3, slowed speech NEURO: no focal motor/sensory deficits  LABORATORY DATA:  I have reviewed the data as listed Lab Results  Component Value Date   WBC 4.0 05/15/2019   HGB 9.3 (L) 05/15/2019   HCT 29.8 (L) 05/15/2019   MCV 106.8 (H) 05/15/2019   PLT 150 05/15/2019   Lab Results  Component Value Date   NA 139 05/15/2019   K 5.7 (H) 05/15/2019   CL 107 05/15/2019   CO2 24 05/15/2019   PATHOLOGY: I personally reviewed the  patient's peripheral blood smear, which showed borderline macrocytic RBC's with some central hypochromia, suggestive of a component of iron deficiency. There were a few echinocytes, which likely reflect uremia from underlying CKD. WBC and platelet morphology was relatively normal.

## 2019-05-15 ENCOUNTER — Inpatient Hospital Stay (HOSPITAL_BASED_OUTPATIENT_CLINIC_OR_DEPARTMENT_OTHER): Payer: PPO | Admitting: Hematology

## 2019-05-15 ENCOUNTER — Telehealth: Payer: Self-pay | Admitting: Hematology

## 2019-05-15 ENCOUNTER — Encounter: Payer: Self-pay | Admitting: Cardiology

## 2019-05-15 ENCOUNTER — Other Ambulatory Visit: Payer: Self-pay

## 2019-05-15 ENCOUNTER — Telehealth: Payer: Self-pay | Admitting: *Deleted

## 2019-05-15 ENCOUNTER — Inpatient Hospital Stay: Payer: PPO | Attending: Hematology

## 2019-05-15 ENCOUNTER — Encounter: Payer: Self-pay | Admitting: Hematology

## 2019-05-15 VITALS — BP 134/43 | HR 47 | Resp 16 | Ht 68.0 in | Wt 165.8 lb

## 2019-05-15 DIAGNOSIS — M545 Low back pain: Secondary | ICD-10-CM | POA: Diagnosis not present

## 2019-05-15 DIAGNOSIS — E875 Hyperkalemia: Secondary | ICD-10-CM | POA: Insufficient documentation

## 2019-05-15 DIAGNOSIS — D539 Nutritional anemia, unspecified: Secondary | ICD-10-CM | POA: Diagnosis not present

## 2019-05-15 DIAGNOSIS — D696 Thrombocytopenia, unspecified: Secondary | ICD-10-CM

## 2019-05-15 DIAGNOSIS — E039 Hypothyroidism, unspecified: Secondary | ICD-10-CM

## 2019-05-15 DIAGNOSIS — Z87891 Personal history of nicotine dependence: Secondary | ICD-10-CM

## 2019-05-15 DIAGNOSIS — G8929 Other chronic pain: Secondary | ICD-10-CM | POA: Diagnosis not present

## 2019-05-15 DIAGNOSIS — N184 Chronic kidney disease, stage 4 (severe): Secondary | ICD-10-CM

## 2019-05-15 DIAGNOSIS — R5383 Other fatigue: Secondary | ICD-10-CM | POA: Insufficient documentation

## 2019-05-15 DIAGNOSIS — Z7901 Long term (current) use of anticoagulants: Secondary | ICD-10-CM | POA: Insufficient documentation

## 2019-05-15 DIAGNOSIS — I4891 Unspecified atrial fibrillation: Secondary | ICD-10-CM | POA: Diagnosis not present

## 2019-05-15 DIAGNOSIS — I129 Hypertensive chronic kidney disease with stage 1 through stage 4 chronic kidney disease, or unspecified chronic kidney disease: Secondary | ICD-10-CM | POA: Diagnosis not present

## 2019-05-15 LAB — CBC WITH DIFFERENTIAL (CANCER CENTER ONLY)
Abs Immature Granulocytes: 0.01 10*3/uL (ref 0.00–0.07)
Basophils Absolute: 0 10*3/uL (ref 0.0–0.1)
Basophils Relative: 1 %
Eosinophils Absolute: 0.1 10*3/uL (ref 0.0–0.5)
Eosinophils Relative: 2 %
HCT: 29.8 % — ABNORMAL LOW (ref 39.0–52.0)
Hemoglobin: 9.3 g/dL — ABNORMAL LOW (ref 13.0–17.0)
Immature Granulocytes: 0 %
Lymphocytes Relative: 26 %
Lymphs Abs: 1.1 10*3/uL (ref 0.7–4.0)
MCH: 33.3 pg (ref 26.0–34.0)
MCHC: 31.2 g/dL (ref 30.0–36.0)
MCV: 106.8 fL — ABNORMAL HIGH (ref 80.0–100.0)
Monocytes Absolute: 0.4 10*3/uL (ref 0.1–1.0)
Monocytes Relative: 10 %
Neutro Abs: 2.4 10*3/uL (ref 1.7–7.7)
Neutrophils Relative %: 61 %
Platelet Count: 150 10*3/uL (ref 150–400)
RBC: 2.79 MIL/uL — ABNORMAL LOW (ref 4.22–5.81)
RDW: 13 % (ref 11.5–15.5)
WBC Count: 4 10*3/uL (ref 4.0–10.5)
nRBC: 0 % (ref 0.0–0.2)

## 2019-05-15 LAB — CMP (CANCER CENTER ONLY)
ALT: 6 U/L (ref 0–44)
AST: 10 U/L — ABNORMAL LOW (ref 15–41)
Albumin: 4.3 g/dL (ref 3.5–5.0)
Alkaline Phosphatase: 111 U/L (ref 38–126)
Anion gap: 8 (ref 5–15)
BUN: 34 mg/dL — ABNORMAL HIGH (ref 8–23)
CO2: 24 mmol/L (ref 22–32)
Calcium: 8.5 mg/dL — ABNORMAL LOW (ref 8.9–10.3)
Chloride: 107 mmol/L (ref 98–111)
Creatinine: 2.39 mg/dL — ABNORMAL HIGH (ref 0.61–1.24)
GFR, Est AFR Am: 30 mL/min — ABNORMAL LOW (ref >60)
GFR, Estimated: 26 mL/min — ABNORMAL LOW (ref >60)
Glucose, Bld: 93 mg/dL (ref 70–99)
Potassium: 5.7 mmol/L — ABNORMAL HIGH (ref 3.5–5.1)
Sodium: 139 mmol/L (ref 135–145)
Total Bilirubin: 0.5 mg/dL (ref 0.3–1.2)
Total Protein: 6.6 g/dL (ref 6.5–8.1)

## 2019-05-15 LAB — IRON AND TIBC
Iron: 48 ug/dL (ref 42–163)
Saturation Ratios: 11 % — ABNORMAL LOW (ref 20–55)
TIBC: 429 ug/dL — ABNORMAL HIGH (ref 202–409)
UIBC: 381 ug/dL — ABNORMAL HIGH (ref 117–376)

## 2019-05-15 LAB — FERRITIN: Ferritin: 13 ng/mL — ABNORMAL LOW (ref 24–336)

## 2019-05-15 LAB — LACTATE DEHYDROGENASE: LDH: 210 U/L — ABNORMAL HIGH (ref 98–192)

## 2019-05-15 LAB — FOLATE: Folate: 14 ng/mL (ref >5.9)

## 2019-05-15 LAB — PROTIME-INR
INR: 1.4 — ABNORMAL HIGH (ref 0.8–1.2)
Prothrombin Time: 17.1 seconds — ABNORMAL HIGH (ref 11.4–15.2)

## 2019-05-15 LAB — APTT: aPTT: 36 seconds (ref 24–36)

## 2019-05-15 LAB — SAVE SMEAR(SSMR), FOR PROVIDER SLIDE REVIEW

## 2019-05-15 LAB — VITAMIN B12: Vitamin B-12: 598 pg/mL (ref 180–914)

## 2019-05-15 MED ORDER — SODIUM POLYSTYRENE SULFONATE PO POWD: ORAL | 0 refills | Status: DC

## 2019-05-15 NOTE — Telephone Encounter (Signed)
Left message for pt to call to reschedule.

## 2019-05-15 NOTE — Telephone Encounter (Signed)
Spoke w/pt's wife gave instructions to pick up Rx at pharmacy for elevated K+, call pcp and have lab rechecked and office visit in 1 week.Pt to start OTC Iron supplement.  Wife verbalized understanding. No concerns.

## 2019-05-15 NOTE — Telephone Encounter (Signed)
Called and LM for pt, Rx for his elevated K+ has been sent to pharmacy. Pt to take for 3 days call PCP have labs rechecked and follow up with PCP within 1 week. Request pt/wife to call office to confirm message received.

## 2019-05-15 NOTE — Telephone Encounter (Signed)
-----   Message from [name], MD sent at 05/15/2019  1:14 PM EDT ----- Graceann Congress,  Can we let Mr. Kling know that his iron level is low, and that he should take an over-the-counter iron supplement (ferrous sulfate 325mg  or any other genetic variety) once a day? He should be cautioned for constipation associated with iron supplements.  Thanks.  Cecil  ----- Message ----- From: Buel Ream, Lab In [place] Sent: 05/15/2019   9:04 AM EDT To: [name], MD

## 2019-05-15 NOTE — Telephone Encounter (Signed)
New Message             [name] is calling to reschedule the 07/01no show appointment. Patient just forgot. Patient would like a call back to see if we can reschedule that appointment.

## 2019-05-15 NOTE — Telephone Encounter (Signed)
This encounter was created in error - please disregard.

## 2019-05-15 NOTE — Telephone Encounter (Signed)
Appointments scheduled LMVM for patient per 7/6 los

## 2019-05-16 ENCOUNTER — Other Ambulatory Visit: Payer: Self-pay | Admitting: *Deleted

## 2019-05-16 DIAGNOSIS — Z1211 Encounter for screening for malignant neoplasm of colon: Secondary | ICD-10-CM

## 2019-05-16 LAB — ERYTHROPOIETIN: Erythropoietin: 16.9 m[IU]/mL (ref 2.6–18.5)

## 2019-05-16 LAB — HCV COMMENT:

## 2019-05-16 LAB — HEPATITIS B SURFACE ANTIGEN: Hepatitis B Surface Ag: NEGATIVE

## 2019-05-16 LAB — HIV ANTIBODY (ROUTINE TESTING W REFLEX): HIV Screen 4th Generation wRfx: NONREACTIVE

## 2019-05-16 LAB — HEPATITIS B SURFACE ANTIBODY,QUALITATIVE: Hep B S Ab: NONREACTIVE

## 2019-05-16 LAB — HEPATITIS C ANTIBODY (REFLEX): HCV Ab: <0.1 s/co ratio (ref 0.0–0.9)

## 2019-05-16 NOTE — Progress Notes (Signed)
HPI: Roberto Fields and Roberto Fields. Previously cared for at [facility]. Catheterization 2013 showed a 99% obtuse marginal and 70% PDA. Patient had PCI of his marginal. Note based on records his disease was predominantly distal and not amenable to revascularization. Carotid Doppler September 2014 showed 1-39% bilateral stenosis. Echocardiogram September 2014 showed normal LV systolic function, Mild left ventricular hypertrophy and mild aortic insufficiency. Patient also with history of atrial fibrillation.Nuclear study September 2017 showed mild inferolateral ischemia and ejection fraction 52%. Study felt to be low risk and we are treating medically.Renal ultrasound 2016 showed no aneurysm. Recent monitor May 02, 2019 showed sinus bradycardia with PACs, PVCs and brief PAT.  Heart rate as low as 37.  Amiodarone discontinued.  Since last seen,patient has occasional chest pain which is unchanged.  It is promptly relieved with nitroglycerin.  He notes some dyspnea on exertion and minimal pedal edema.  No orthopnea or PND.  Current Outpatient Medications  Medication Sig Dispense Refill  . albuterol (PROVENTIL HFA;VENTOLIN HFA) 108 (90 Base) MCG/ACT inhaler Inhale 2 puffs into the lungs every 6 (six) hours as needed for wheezing. 18 g 4  . amLODipine (NORVASC) 5 MG tablet Take 1 tablet (5 mg total) by mouth daily. 90 tablet 3  . ELIQUIS 5 MG TABS tablet Take 5 mg by mouth daily.     . fluorometholone (FML) 0.1 % ophthalmic suspension INSTILL 1 DROP INTO AFFECTED EYE(S) TWICE DAILY    . gabapentin (NEURONTIN) 300 MG capsule Take 300 mg by mouth 3 (three) times daily.    Marland Kitchen levothyroxine (SYNTHROID) 88 MCG tablet Take 1 tablet by mouth once daily 90 tablet 0  . meclizine (ANTIVERT) 25 MG tablet TAKE 1 TABLET BY MOUTH THREE TIMES DAILY AS NEEDED FOR DIZZINESS 90 tablet 0  . niacin (NIASPAN) 500 MG CR tablet Take 500 mg by mouth daily.     . nitroGLYCERIN (NITROSTAT) 0.4 MG SL tablet DISSOLVE ONE TABLET  UNDER THE TONGUE EVERY 5 MINUTES AS NEEDED FOR CHEST PAIN 100 tablet prn  . omeprazole (PRILOSEC) 40 MG capsule Take 1 capsule (40 mg total) by mouth daily. 90 capsule 3  . sodium polystyrene (KAYEXALATE) powder 30g daily x 3 days. Pls follow up with your PCP within the week. 90 g 0  . telmisartan (MICARDIS) 80 MG tablet Take 1 tablet by mouth once daily 90 tablet 1   No current facility-administered medications for this visit.      Past Medical History:  Diagnosis Date  . BPH (benign prostatic hyperplasia)   . CAD (coronary artery disease)   . Cataract    bilateral cataracts removed  . COPD (chronic obstructive pulmonary disease) ([place])   . CVA (cerebral infarction)   . Hyperlipidemia   . Hypertension   . Hypothyroid   . Renal insufficiency   . Ulcer 1992   HX peptic ulcer- DX: by endoscopy    Past Surgical History:  Procedure Laterality Date  . arm surgery    . CORONARY STENT PLACEMENT  04/2011   LAD, [facility]  . EYE SURGERY    . HERNIA MESH REMOVAL    . [place]  . TRANSURETHRAL RESECTION OF PROSTATE      Social History   Socioeconomic History  . Marital status: Married    Spouse name: Not on file  . Number of children: 3  . Years of education: Not on file  . Highest education level: Not on  file  Occupational History  . Not on file  Social Needs  . Financial resource strain: Not on file  . Food insecurity    Worry: Not on file    Inability: Not on file  . Transportation needs    Medical: Not on file    Non-medical: Not on file  Tobacco Use  . Smoking status: Former Research scientist (life sciences)  . Smokeless tobacco: Never Used  Substance and Sexual Activity  . Alcohol use: No  . Drug use: No  . Sexual activity: Not on file  Lifestyle  . Physical activity    Days per week: Not on file    Minutes per session: Not on file  . Stress: Not on file  Relationships  . Social Herbalist on phone: Not on file    Gets together:  Not on file    Attends religious service: Not on file    Active member of club or organization: Not on file    Attends meetings of clubs or organizations: Not on file    Relationship status: Not on file  . Intimate partner violence    Fear of current or ex partner: Not on file    Emotionally abused: Not on file    Physically abused: Not on file    Forced sexual activity: Not on file  Other Topics Concern  . Not on file  Social History Narrative  . Not on file    Family History  Problem Relation Age of Onset  . Heart disease Father   . Coronary artery disease Brother   . Heart attack Brother 36       pacemaker and defibrillator  . Coronary artery disease Brother   . Hypertension Other        family history of  . Heart disease Sister     ROS: no fevers or chills, productive cough, hemoptysis, dysphasia, odynophagia, melena, hematochezia, dysuria, hematuria, rash, seizure activity, orthopnea, PND, pedal edema, claudication. Remaining systems are negative.  Physical Exam: Well-developed well-nourished in no acute distress.  Skin is warm and dry.  HEENT is normal.  Neck is supple.  Chest is clear to auscultation with normal expansion.  Cardiovascular exam is regular rate and rhythm.  Abdominal exam nontender or distended. No masses palpated. Extremities show no edema. neuro grossly intact  A/P  1 bradycardia-recent monitor showed heart rate as low as 37.  Amiodarone was discontinued.  Heart rate improved to 52 today.  We will follow.  2 paroxysmal atrial fibrillation-we will follow for recurrent atrial fibrillation.  Continue apixaban at present dose.  3 coronary artery disease-patient denies chest pain.  He is not on aspirin given need for apixaban.  He is intolerant to statins.  4 hypertension-blood pressure controlled.  Continue present medications and follow.  5 hyperlipidemia-patient is intolerant to statins and Zetia.  He does not want to consider Repatha.  6  chronic stage III kidney disease-monitored by nephrology.  Kirk Ruths, MD

## 2019-05-17 ENCOUNTER — Other Ambulatory Visit: Payer: Self-pay

## 2019-05-17 ENCOUNTER — Encounter: Payer: Self-pay | Admitting: Cardiology

## 2019-05-17 ENCOUNTER — Ambulatory Visit (INDEPENDENT_AMBULATORY_CARE_PROVIDER_SITE_OTHER): Payer: PPO | Admitting: Cardiology

## 2019-05-17 VITALS — BP 139/66 | HR 52 | Ht 68.0 in | Wt 165.8 lb

## 2019-05-17 DIAGNOSIS — I10 Essential (primary) hypertension: Secondary | ICD-10-CM

## 2019-05-17 DIAGNOSIS — I2510 Atherosclerotic heart disease of native coronary artery without angina pectoris: Secondary | ICD-10-CM

## 2019-05-17 DIAGNOSIS — I480 Paroxysmal atrial fibrillation: Secondary | ICD-10-CM | POA: Diagnosis not present

## 2019-05-17 DIAGNOSIS — E7800 Pure hypercholesterolemia, unspecified: Secondary | ICD-10-CM

## 2019-05-17 LAB — COPPER, SERUM: Copper: 106 ug/dL (ref 72–166)

## 2019-05-17 LAB — SOLUBLE TRANSFERRIN RECEPTOR: Transferrin Receptor: 16.5 nmol/L (ref 12.2–27.3)

## 2019-05-17 NOTE — Patient Instructions (Signed)
Medication Instructions:  NO CHANGE If you need a refill on your cardiac medications before your next appointment, please call your pharmacy.   Lab work: If you have labs (blood work) drawn today and your tests are completely normal, you will receive your results only by: Marland Kitchen MyChart Message (if you have MyChart) OR . A paper copy in the mail If you have any lab test that is abnormal or we need to change your treatment, we will call you to review the results.  Follow-Up: At [facility], you and your health needs are our priority.  As part of our continuing mission to provide you with exceptional heart care, we have created designated Provider Care Teams.  These Care Teams include your primary Cardiologist (physician) and Advanced Practice Providers (APPs -  Physician Assistants and Nurse Practitioners) who all work together to provide you with the care you need, when you need it. Your physician recommends that you schedule a follow-up appointment in:  [place]

## 2019-05-23 ENCOUNTER — Other Ambulatory Visit: Payer: PPO

## 2019-05-23 ENCOUNTER — Ambulatory Visit: Payer: PPO | Admitting: Hematology

## 2019-05-24 ENCOUNTER — Encounter: Payer: Self-pay | Admitting: Gastroenterology

## 2019-05-24 ENCOUNTER — Ambulatory Visit (INDEPENDENT_AMBULATORY_CARE_PROVIDER_SITE_OTHER): Payer: PPO | Admitting: Family Medicine

## 2019-05-24 ENCOUNTER — Other Ambulatory Visit: Payer: Self-pay

## 2019-05-24 ENCOUNTER — Encounter: Payer: Self-pay | Admitting: Family Medicine

## 2019-05-24 VITALS — BP 128/62 | HR 51 | Ht 68.0 in | Wt 165.0 lb

## 2019-05-24 DIAGNOSIS — Z1211 Encounter for screening for malignant neoplasm of colon: Secondary | ICD-10-CM | POA: Diagnosis not present

## 2019-05-24 DIAGNOSIS — D539 Nutritional anemia, unspecified: Secondary | ICD-10-CM | POA: Diagnosis not present

## 2019-05-24 DIAGNOSIS — E875 Hyperkalemia: Secondary | ICD-10-CM

## 2019-05-24 DIAGNOSIS — E039 Hypothyroidism, unspecified: Secondary | ICD-10-CM

## 2019-05-24 MED ORDER — LEVOTHYROXINE SODIUM 88 MCG PO TABS: 88.0000 ug | ORAL_TABLET | Freq: Every day | ORAL | 1 refills | Status: DC

## 2019-05-24 NOTE — Assessment & Plan Note (Signed)
Refills sent to pharmacy. 

## 2019-05-24 NOTE — Progress Notes (Signed)
Established Patient Office Visit  Subjective:  Patient ID: Roberto Fields, male    DOB: 11-02-1946  Age: 73 y.o. MRN: [id number]  CC:  Chief Complaint  Patient presents with  . Follow-up    pt reports that Dr. Maylon Peppers discussed that he may need an Endoscopy and Colonoscopy done and he and his wife would like to discuss this and asked about the injections that Dr. Maylon Peppers wanted him to have.    HPI [name] presents for   pt reports that Dr. Maylon Peppers discussed that he may need an Endoscopy and Colonoscopy done and he and his wife would like to discuss this and asked about the injections that Dr. Maylon Peppers wanted him to have.  On labs he was recently found to be hyperkalemic.  He was placed on Kayexalate for 3 days and Dr. Maylon Peppers wanted [place] to recheck that today.  Also he was recently evaluated for macrocytic anemia.  He had been borderline anemic for a while but his recent hemoglobins had dropped more significantly.  There was some concern about the potential for GI blood loss.  They have been looking for blood in the stool and have not seen any but I did have him do some Hemoccult stool cards 1 of which did come back positive.  It was recommended that he be referred for GI evaluation for possible colonoscopy and endoscopy.  Last colonoscopy was June 2010 so it has been 10 years.  He still complains of feeling tired.  He is back up to taking his Eliquis twice a day.  He had dropped it down to once a day because he felt like it was making him feel extremely weak.  I did encourage him to try it twice a day again just to see.  The weakness may have been coming from something else when he had problems previously.  His wife is here with him today.  Past Medical History:  Diagnosis Date  . BPH (benign prostatic hyperplasia)   . CAD (coronary artery disease)   . Cataract    bilateral cataracts removed  . COPD (chronic obstructive pulmonary disease) ([place])   . CVA (cerebral infarction)   . Hyperlipidemia    . Hypertension   . Hypothyroid   . Renal insufficiency   . Ulcer 1992   HX peptic ulcer- DX: by endoscopy    Past Surgical History:  Procedure Laterality Date  . arm surgery    . CORONARY STENT PLACEMENT  04/2011   LAD, [facility]  . EYE SURGERY    . HERNIA MESH REMOVAL    . [place]  . TRANSURETHRAL RESECTION OF PROSTATE      Family History  Problem Relation Age of Onset  . Heart disease Father   . Coronary artery disease Brother   . Heart attack Brother 36       pacemaker and defibrillator  . Coronary artery disease Brother   . Hypertension Other        family history of  . Heart disease Sister     Social History   Socioeconomic History  . Marital status: Married    Spouse name: Not on file  . Number of children: 3  . Years of education: Not on file  . Highest education level: Not on file  Occupational History  . Not on file  Social Needs  . Financial resource strain: Not on file  . Food insecurity  Worry: Not on file    Inability: Not on file  . Transportation needs    Medical: Not on file    Non-medical: Not on file  Tobacco Use  . Smoking status: Former Research scientist (life sciences)  . Smokeless tobacco: Never Used  Substance and Sexual Activity  . Alcohol use: No  . Drug use: No  . Sexual activity: Not on file  Lifestyle  . Physical activity    Days per week: Not on file    Minutes per session: Not on file  . Stress: Not on file  Relationships  . Social Herbalist on phone: Not on file    Gets together: Not on file    Attends religious service: Not on file    Active member of club or organization: Not on file    Attends meetings of clubs or organizations: Not on file    Relationship status: Not on file  . Intimate partner violence    Fear of current or ex partner: Not on file    Emotionally abused: Not on file    Physically abused: Not on file    Forced sexual activity: Not on file  Other Topics Concern  .  Not on file  Social History Narrative  . Not on file    Outpatient Medications Prior to Visit  Medication Sig Dispense Refill  . albuterol (PROVENTIL HFA;VENTOLIN HFA) 108 (90 Base) MCG/ACT inhaler Inhale 2 puffs into the lungs every 6 (six) hours as needed for wheezing. 18 g 4  . amLODipine (NORVASC) 5 MG tablet Take 1 tablet (5 mg total) by mouth daily. 90 tablet 3  . ELIQUIS 5 MG TABS tablet Take 5 mg by mouth daily.     . fluorometholone (FML) 0.1 % ophthalmic suspension INSTILL 1 DROP INTO AFFECTED EYE(S) TWICE DAILY    . gabapentin (NEURONTIN) 300 MG capsule Take 300 mg by mouth 3 (three) times daily.    . meclizine (ANTIVERT) 25 MG tablet TAKE 1 TABLET BY MOUTH THREE TIMES DAILY AS NEEDED FOR DIZZINESS 90 tablet 0  . niacin (NIASPAN) 500 MG CR tablet Take 500 mg by mouth daily.     . nitroGLYCERIN (NITROSTAT) 0.4 MG SL tablet DISSOLVE ONE TABLET UNDER THE TONGUE EVERY 5 MINUTES AS NEEDED FOR CHEST PAIN 100 tablet prn  . omeprazole (PRILOSEC) 40 MG capsule Take 1 capsule (40 mg total) by mouth daily. 90 capsule 3  . telmisartan (MICARDIS) 80 MG tablet Take 1 tablet by mouth once daily 90 tablet 1  . levothyroxine (SYNTHROID) 88 MCG tablet Take 1 tablet by mouth once daily 90 tablet 0  . sodium polystyrene (KAYEXALATE) powder 30g daily x 3 days. Pls follow up with your PCP within the week. 90 g 0   No facility-administered medications prior to visit.     Allergies  Allergen Reactions  . Aspirin   . Atorvastatin     REACTION: Muscle weakness  . Colesevelam     REACTION: joint pain  . Nsaids Other (See Comments)    Avoid with current kidney function.    . Pravastatin Other (See Comments)    Muscle aches  . Simvastatin     REACTION: Myalgias    ROS Review of Systems    Objective:    Physical Exam  Constitutional: He is oriented to person, place, and time. He appears well-developed and well-nourished.  HENT:  Head: Normocephalic and atraumatic.  Cardiovascular: Normal  rate, regular rhythm and normal heart sounds.  Pulmonary/Chest:  Effort normal and breath sounds normal.  Neurological: He is alert and oriented to person, place, and time.  Skin: Skin is warm and dry.  Psychiatric: He has a normal mood and affect. His behavior is normal.    BP 128/62   Pulse (!) 51   Ht 5\' 8"  (1.727 m)   Wt 165 lb (74.8 kg)   SpO2 96%   BMI 25.09 kg/m  Wt Readings from Last 3 Encounters:  05/24/19 165 lb (74.8 kg)  05/17/19 165 lb 12.8 oz (75.2 kg)  05/15/19 165 lb 12.8 oz (75.2 kg)     Health Maintenance Due  Topic Date Due  . COLONOSCOPY  04/30/2019    There are no preventive care reminders to display for this patient.  Lab Results  Component Value Date   TSH 2.22 01/18/2019   Lab Results  Component Value Date   WBC 4.0 05/15/2019   HGB 9.3 (L) 05/15/2019   HCT 29.8 (L) 05/15/2019   MCV 106.8 (H) 05/15/2019   PLT 150 05/15/2019   Lab Results  Component Value Date   NA 139 05/15/2019   K 5.7 (H) 05/15/2019   CO2 24 05/15/2019   GLUCOSE 93 05/15/2019   BUN 34 (H) 05/15/2019   CREATININE 2.39 (H) 05/15/2019   BILITOT 0.5 05/15/2019   ALKPHOS 111 05/15/2019   AST 10 (L) 05/15/2019   ALT 6 05/15/2019   PROT 6.6 05/15/2019   ALBUMIN 4.3 05/15/2019   CALCIUM 8.5 (L) 05/15/2019   ANIONGAP 8 05/15/2019   Lab Results  Component Value Date   CHOL 185 08/05/2018   Lab Results  Component Value Date   HDL 37 (L) 08/05/2018   Lab Results  Component Value Date   LDLCALC 126 (H) 08/05/2018   Lab Results  Component Value Date   TRIG 113 08/05/2018   Lab Results  Component Value Date   CHOLHDL 5.0 (H) 08/05/2018   Lab Results  Component Value Date   HGBA1C 5.5 08/26/2015      Assessment & Plan:   Problem List Items Addressed This Visit      Endocrine   Hypothyroidism    Refills sent to pharmacy.      Relevant Medications   levothyroxine (SYNTHROID) 88 MCG tablet     Other   Macrocytic anemia    Did discuss options with  Mr. Dancy and his wife.  Recommended that he consult GI for possible colonoscopy/endoscopy.  I think it is important to rule out any type of lesions and/or cancer since he did have a more recent and sudden drop in his hemoglobin and he is on Eliquis.      Relevant Orders   Ambulatory referral to Gastroenterology   Hyperkalemia - Primary   Relevant Orders   BASIC METABOLIC PANEL WITH GFR    Other Visit Diagnoses    Screening for colon cancer         Hyperkalemia-due to recheck potassium levels.  Meds ordered this encounter  Medications  . levothyroxine (SYNTHROID) 88 MCG tablet    Sig: Take 1 tablet (88 mcg total) by mouth daily.    Dispense:  90 tablet    Refill:  1    Follow-up: No follow-ups on file.    Beatrice Lecher, MD

## 2019-05-24 NOTE — Assessment & Plan Note (Signed)
Did discuss options with Roberto Fields and his wife.  Recommended that he consult GI for possible colonoscopy/endoscopy.  I think it is important to rule out any type of lesions and/or cancer since he did have a more recent and sudden drop in his hemoglobin and he is on Eliquis.

## 2019-05-25 LAB — BASIC METABOLIC PANEL WITH GFR
BUN/Creatinine Ratio: 11 (calc) (ref 6–22)
BUN: 25 mg/dL (ref 7–25)
CO2: 25 mmol/L (ref 20–32)
Calcium: 9 mg/dL (ref 8.6–10.3)
Chloride: 109 mmol/L (ref 98–110)
Creat: 2.26 mg/dL — ABNORMAL HIGH (ref 0.70–1.18)
GFR, Est African American: 32 mL/min/{1.73_m2} — ABNORMAL LOW (ref >=60)
GFR, Est Non African American: 27 mL/min/{1.73_m2} — ABNORMAL LOW (ref >=60)
Glucose, Bld: 97 mg/dL (ref 65–99)
Potassium: 5.1 mmol/L (ref 3.5–5.3)
Sodium: 140 mmol/L (ref 135–146)

## 2019-06-05 DIAGNOSIS — H401133 Primary open-angle glaucoma, bilateral, severe stage: Secondary | ICD-10-CM | POA: Diagnosis not present

## 2019-06-05 DIAGNOSIS — H16223 Keratoconjunctivitis sicca, not specified as Sjogren's, bilateral: Secondary | ICD-10-CM | POA: Diagnosis not present

## 2019-06-19 ENCOUNTER — Telehealth: Payer: Self-pay

## 2019-06-19 ENCOUNTER — Other Ambulatory Visit: Payer: Self-pay

## 2019-06-19 ENCOUNTER — Encounter: Payer: Self-pay | Admitting: Gastroenterology

## 2019-06-19 ENCOUNTER — Ambulatory Visit: Payer: PPO | Admitting: Gastroenterology

## 2019-06-19 VITALS — BP 134/54 | HR 46 | Temp 97.8°F | Ht 68.0 in | Wt 167.1 lb

## 2019-06-19 DIAGNOSIS — D500 Iron deficiency anemia secondary to blood loss (chronic): Secondary | ICD-10-CM

## 2019-06-19 DIAGNOSIS — K219 Gastro-esophageal reflux disease without esophagitis: Secondary | ICD-10-CM

## 2019-06-19 NOTE — Patient Instructions (Addendum)
If you are age 75 or older, your body mass index should be between 23-30. Your Body mass index is 25.41 kg/m. If this is out of the aforementioned range listed, please consider follow up with your Primary Care Provider.  If you are age 28 or younger, your body mass index should be between 19-25. Your Body mass index is 25.41 kg/m. If this is out of the aformentioned range listed, please consider follow up with your Primary Care Provider.   It has been recommended to you by your physician that you have a(n) EGD/Colonoscopy completed. Per your request, we did not schedule the procedure(s) today. Please contact our office at [phone] should you decide to have the procedure completed.   You will be contacted by our office prior to your procedure for directions on holding your Eliquis.  If you do not hear from our office 1 week prior to your scheduled procedure, please call [phone] to discuss.   Thank you,  Dr. Jackquline Denmark

## 2019-06-19 NOTE — Telephone Encounter (Signed)
[facility] Pre-operative Risk Assessment     Request for surgical clearance:     Endoscopy Procedure  What type of surgery is being performed?    EGD/Colononscopy  When is this surgery scheduled?     Sept 2020  What type of clearance is required ?   Pharmacy  Are there any medications that need to be held prior to surgery and how long? Eliquis  Practice name and name of physician performing surgery?      [facility]  What is your office phone and fax number?      Phone- [phone]  [phone]  Anesthesia type (None, local, MAC, general) ?       MAC

## 2019-06-19 NOTE — Telephone Encounter (Signed)
Please comment on eliquis for EGD/colonoscopy.

## 2019-06-19 NOTE — Progress Notes (Signed)
Chief Complaint: Anemia with heme positive stools.  Referring Provider:  [name], *      ASSESSMENT AND PLAN;   #1. Heme positive stools.  #2. H/O advanced polyps  #3. Macrocytic anemia -likely multifactorial-has CKD4, IDA.  Being followed by hematology. Nl B12/folate  #4. Co-morbid conditions: Afib on eliquis, CAD (Dr Demaris Callander), HTN, HLD, CKD4, H/O CVA.    Plan: -Proceed with EGD/colon. Discussed risks & benefits. (Risks including rare perforation req laparotomy, bleeding after biopsies/polypectomy req blood transfusion, rare chance of missing neoplasms, risks of anesthesia/sedation). Benefits outweigh the risks. Patient agrees to proceed. All the questions were answered. -Cardiology cleaence to hold eliquis 24-48hr before. When off eliquis, he can take baby ASA.  Also note bradycardia. -If still with problems and the above WU is negative, proceed with CT scan abdo/pelvis. -Continue to trend CBC. -High risk for any endoscopic procedures.  Patient and patient's wife understand.    HPI:    Roberto Fields is a 73 y.o. male  With heme positive stools No overt GI bleeding. Seen by hematology.  Has worsening anemia. Advised GI work-up by means of EGD and colonoscopy.  No nausea, vomiting, heartburn, regurgitation, odynophagia or dysphagia.  No significant diarrhea or constipation.  There is no melena or hematochezia. No unintentional weight loss.  Remote H/O EGD/colon 2010 - Dr Quentin Cornwall.  Apparently had peptic ulcer disease, had significant polyp which was later removed same year at [facility].  (larger polyps) -told to get it repeated in 2016.  Unfortunately, patient had a stroke.  Past Medical History:  Diagnosis Date  . Arthritis   . Atrial fibrillation ([place])   . BPH (benign prostatic hyperplasia)   . CAD (coronary artery disease)   . Cataract    bilateral cataracts removed  . COPD (chronic obstructive pulmonary disease) ([place])   . CVA (cerebral infarction)    . History of blood clots   . History of colon polyps   . Hyperlipidemia   . Hypertension   . Hypothyroid   . Renal insufficiency   . Stomach ulcer   . Stroke ([place])   . Ulcer 1992   HX peptic ulcer- DX: by endoscopy    Past Surgical History:  Procedure Laterality Date  . arm surgery    . COLONOSCOPY     Dr Lavina Hamman in [place]. Dr Unk Lightning removed a larger polyp  . CORONARY STENT PLACEMENT  04/2011   LAD, [facility]  . ESOPHAGOGASTRODUODENOSCOPY     dr Lavina Hamman in [place]  . EYE SURGERY    . HERNIA MESH REMOVAL    . [place]  . TRANSURETHRAL RESECTION OF PROSTATE      Family History  Problem Relation Age of Onset  . Heart disease Father   . Coronary artery disease Brother   . Heart attack Brother 36       pacemaker and defibrillator  . Coronary artery disease Brother   . Hypertension Other        family history of  . Heart disease Sister   . Colon cancer Neg Hx   . Esophageal cancer Neg Hx     Social History   Tobacco Use  . Smoking status: Former Research scientist (life sciences)  . Smokeless tobacco: Never Used  Substance Use Topics  . Alcohol use: No  . Drug use: No    Current Outpatient Medications  Medication Sig Dispense Refill  . albuterol (PROVENTIL HFA;VENTOLIN HFA) 108 (90 Base) MCG/ACT  inhaler Inhale 2 puffs into the lungs every 6 (six) hours as needed for wheezing. 18 g 4  . amLODipine (NORVASC) 5 MG tablet Take 1 tablet (5 mg total) by mouth daily. 90 tablet 3  . Cyanocobalamin (VITAMIN B-12 PO) Take 1 tablet by mouth daily.    Marland Kitchen ELIQUIS 5 MG TABS tablet Take 5 mg by mouth daily.     . Ferrous Sulfate (IRON SUPPLEMENT PO) Take 1 tablet by mouth daily.    Marland Kitchen gabapentin (NEURONTIN) 300 MG capsule Take 300 mg by mouth 3 (three) times daily.    . Latanoprost (XELPROS) 0.005 % EMUL Apply 1 drop to eye daily.    Marland Kitchen levothyroxine (SYNTHROID) 88 MCG tablet Take 1 tablet (88 mcg total) by mouth daily. 90 tablet 1  . meclizine (ANTIVERT) 25  MG tablet TAKE 1 TABLET BY MOUTH THREE TIMES DAILY AS NEEDED FOR DIZZINESS 90 tablet 0  . niacin (NIASPAN) 500 MG CR tablet Take 500 mg by mouth daily.     . nitroGLYCERIN (NITROSTAT) 0.4 MG SL tablet DISSOLVE ONE TABLET UNDER THE TONGUE EVERY 5 MINUTES AS NEEDED FOR CHEST PAIN 100 tablet prn  . omeprazole (PRILOSEC) 40 MG capsule Take 1 capsule (40 mg total) by mouth daily. 90 capsule 3  . telmisartan (MICARDIS) 80 MG tablet Take 1 tablet by mouth once daily 90 tablet 1  . timolol (TIMOPTIC) 0.5 % ophthalmic solution 1 drop 2 (two) times daily.     No current facility-administered medications for this visit.     Allergies  Allergen Reactions  . Aspirin   . Atorvastatin     REACTION: Muscle weakness  . Colesevelam     REACTION: joint pain  . Nsaids Other (See Comments)    Avoid with current kidney function.    . Pravastatin Other (See Comments)    Muscle aches  . Simvastatin     REACTION: Myalgias    Review of Systems:  Constitutional: Denies fever, chills, diaphoresis, appetite change and fatigue.  HEENT: Denies photophobia, eye pain, redness, hearing loss, ear pain, congestion, sore throat, rhinorrhea, sneezing, mouth sores, neck pain, neck stiffness and tinnitus.   Respiratory: Denies SOB, DOE, cough, chest tightness,  and wheezing.   Cardiovascular: Denies chest pain, palpitations and leg swelling.  Genitourinary: Denies dysuria, urgency, frequency, hematuria, flank pain and difficulty urinating.  Musculoskeletal: Denies myalgias, back pain, joint swelling, arthralgias and gait problem.  Skin: No rash.  Neurological: Denies dizziness, seizures, syncope, weakness, light-headedness, numbness and headaches.  Hematological: Denies adenopathy. Easy bruising, personal or family bleeding history  Psychiatric/Behavioral: No anxiety or depression     Physical Exam:    BP (!) 134/54   Pulse (!) 46   Temp 97.8 F (36.6 C)   Ht 5\' 8"  (1.727 m)   Wt 167 lb 2 oz (75.8 kg)   BMI  25.41 kg/m  Filed Weights   06/19/19 0845  Weight: 167 lb 2 oz (75.8 kg)   Constitutional:  Well-developed, in no acute distress. Psychiatric: Normal mood and affect. Behavior is normal. HEENT: Pupils normal.  Conjunctivae are normal. No scleral icterus. Neck supple.  Cardiovascular: Normal rate, regular rhythm. No edema Pulmonary/chest: Effort normal and breath sounds normal. No wheezing, rales or rhonchi. Abdominal: Soft, nondistended. Nontender. Bowel sounds active throughout. There are no masses palpable. No hepatomegaly. Rectal:  defered Neurological: Alert and oriented to person place and time. Skin: Skin is warm and dry. No rashes noted.  Data Reviewed: I have personally reviewed following  labs and imaging studies  CBC: CBC Latest Ref Rng & Units 05/15/2019 05/01/2019 12/19/2018  WBC 4.0 - 10.5 K/uL 4.0 4.0 4.9  Hemoglobin 13.0 - 17.0 g/dL 9.3(L) 9.4(L) 10.3(L)  Hematocrit 39.0 - 52.0 % 29.8(L) 28.4(L) 30.3(L)  Platelets 150 - 400 K/uL 150 116(L) 147    CMP: CMP Latest Ref Rng & Units 05/24/2019 05/15/2019 05/01/2019  Glucose 65 - 99 mg/dL 97 93 87  BUN 7 - 25 mg/dL 25 34(H) 28(H)  Creatinine 0.70 - 1.18 mg/dL 2.26(H) 2.39(H) 2.30(H)  Sodium 135 - 146 mmol/L 140 139 140  Potassium 3.5 - 5.3 mmol/L 5.1 5.7(H) 5.1  Chloride 98 - 110 mmol/L 109 107 109  CO2 20 - 32 mmol/L 25 24 25   Calcium 8.6 - 10.3 mg/dL 9.0 8.5(L) 9.3  Total Protein 6.5 - 8.1 g/dL - 6.6 -  Total Bilirubin 0.3 - 1.2 mg/dL - 0.5 -  Alkaline Phos 38 - 126 U/L - 111 -  AST 15 - 41 U/L - 10(L) -  ALT 0 - 44 U/L - 6 -      Carmell Austria, MD 06/19/2019, 9:00 AM  Cc: [name], *

## 2019-06-19 NOTE — Telephone Encounter (Signed)
Pt takes Eliquis for afib with CHADS2VASc score of 6 (age x2, HTN, CAD, stroke). CrCl is 30, pt is on Eliquis 5mg  BID due to age and weight. Ideally want to minimize time off of anticoagulation prior to low bleed risk procedure due to history of stroke, however with renal impairment, slightly longer hold of 36 hours may be warranted rather than typical 24 hours hold. Will defer to MD.

## 2019-06-19 NOTE — Telephone Encounter (Signed)
Covid-19 screening questions   Do you now or have you had a fever in the last 14 days? No  Do you have any respiratory symptoms of shortness of breath or cough now or in the last 14 days? No  Do you have any family members or close contacts with diagnosed or suspected Covid-19 in the past 14 days? No  Have you been tested for Covid-19 and found to be positive? No        

## 2019-06-20 NOTE — Telephone Encounter (Signed)
Agree with holding apixaban 36 hours prior to procedure and resume after when ok with GI Kirk Ruths

## 2019-06-20 NOTE — Telephone Encounter (Signed)
   Primary Cardiologist: Kirk Ruths, MD  Chart reviewed as part of pre-operative protocol coverage. Request received to provide recommendations for anticoagulation.  Per our clinical pharmacist: Pt takes Eliquis for afib with CHADS2VASc score of 6 (age x2, HTN, CAD, stroke). CrCl is 30, pt is on Eliquis 5mg  BID due to age and weight. Ideally want to minimize time off of anticoagulation prior to low bleed risk procedure due to history of stroke, however with renal impairment, slightly longer hold of 36 hours may be warranted rather than typical 24 hours hold. Will defer to MD.  Per Dr. [name]: Agree with holding apixaban 36 hours prior to procedure and resume after when ok with GI   I will route this recommendation to the requesting party via Epic fax function and remove from pre-op pool.  Please call with questions.  Tami Lin Rosemond Lyttle, PA 06/20/2019, 10:36 AM

## 2019-06-20 NOTE — Telephone Encounter (Signed)
Spoke to pt and informed to hold eliquis 36 hours prior to procedure. Pt verbalized understanding.

## 2019-06-26 DIAGNOSIS — I6350 Cerebral infarction due to unspecified occlusion or stenosis of unspecified cerebral artery: Secondary | ICD-10-CM | POA: Diagnosis not present

## 2019-06-26 DIAGNOSIS — G44209 Tension-type headache, unspecified, not intractable: Secondary | ICD-10-CM | POA: Diagnosis not present

## 2019-06-26 DIAGNOSIS — R42 Dizziness and giddiness: Secondary | ICD-10-CM | POA: Diagnosis not present

## 2019-07-10 ENCOUNTER — Encounter: Payer: Self-pay | Admitting: Gastroenterology

## 2019-07-18 ENCOUNTER — Telehealth: Payer: Self-pay | Admitting: Hematology

## 2019-07-18 ENCOUNTER — Inpatient Hospital Stay (HOSPITAL_BASED_OUTPATIENT_CLINIC_OR_DEPARTMENT_OTHER): Payer: PPO | Admitting: Hematology

## 2019-07-18 ENCOUNTER — Encounter: Payer: Self-pay | Admitting: Hematology

## 2019-07-18 ENCOUNTER — Other Ambulatory Visit: Payer: Self-pay

## 2019-07-18 ENCOUNTER — Inpatient Hospital Stay: Payer: PPO | Attending: Hematology

## 2019-07-18 VITALS — BP 134/42 | HR 90 | Temp 97.1°F | Resp 18 | Ht 68.0 in | Wt 164.1 lb

## 2019-07-18 DIAGNOSIS — N184 Chronic kidney disease, stage 4 (severe): Secondary | ICD-10-CM

## 2019-07-18 DIAGNOSIS — E875 Hyperkalemia: Secondary | ICD-10-CM

## 2019-07-18 DIAGNOSIS — I4891 Unspecified atrial fibrillation: Secondary | ICD-10-CM | POA: Diagnosis not present

## 2019-07-18 DIAGNOSIS — D539 Nutritional anemia, unspecified: Secondary | ICD-10-CM | POA: Diagnosis not present

## 2019-07-18 DIAGNOSIS — I129 Hypertensive chronic kidney disease with stage 1 through stage 4 chronic kidney disease, or unspecified chronic kidney disease: Secondary | ICD-10-CM | POA: Diagnosis not present

## 2019-07-18 DIAGNOSIS — Z79899 Other long term (current) drug therapy: Secondary | ICD-10-CM | POA: Diagnosis not present

## 2019-07-18 DIAGNOSIS — E039 Hypothyroidism, unspecified: Secondary | ICD-10-CM | POA: Insufficient documentation

## 2019-07-18 DIAGNOSIS — D696 Thrombocytopenia, unspecified: Secondary | ICD-10-CM | POA: Insufficient documentation

## 2019-07-18 DIAGNOSIS — Z7901 Long term (current) use of anticoagulants: Secondary | ICD-10-CM | POA: Insufficient documentation

## 2019-07-18 LAB — RETICULOCYTES
Immature Retic Fract: 12.9 % (ref 2.3–15.9)
RBC.: 2.84 MIL/uL — ABNORMAL LOW (ref 4.22–5.81)
Retic Count, Absolute: 60.8 10*3/uL (ref 19.0–186.0)
Retic Ct Pct: 2.1 % (ref 0.4–3.1)

## 2019-07-18 LAB — CMP (CANCER CENTER ONLY)
ALT: 7 U/L (ref 0–44)
AST: 10 U/L — ABNORMAL LOW (ref 15–41)
Albumin: 4.3 g/dL (ref 3.5–5.0)
Alkaline Phosphatase: 118 U/L (ref 38–126)
Anion gap: 6 (ref 5–15)
BUN: 35 mg/dL — ABNORMAL HIGH (ref 8–23)
CO2: 25 mmol/L (ref 22–32)
Calcium: 9.6 mg/dL (ref 8.9–10.3)
Chloride: 110 mmol/L (ref 98–111)
Creatinine: 2.47 mg/dL — ABNORMAL HIGH (ref 0.61–1.24)
GFR, Est AFR Am: 28 mL/min — ABNORMAL LOW (ref >60)
GFR, Estimated: 25 mL/min — ABNORMAL LOW (ref >60)
Glucose, Bld: 103 mg/dL — ABNORMAL HIGH (ref 70–99)
Potassium: 5.7 mmol/L — ABNORMAL HIGH (ref 3.5–5.1)
Sodium: 141 mmol/L (ref 135–145)
Total Bilirubin: 0.4 mg/dL (ref 0.3–1.2)
Total Protein: 6.7 g/dL (ref 6.5–8.1)

## 2019-07-18 LAB — CBC WITH DIFFERENTIAL (CANCER CENTER ONLY)
Abs Immature Granulocytes: 0.01 10*3/uL (ref 0.00–0.07)
Basophils Absolute: 0 10*3/uL (ref 0.0–0.1)
Basophils Relative: 1 %
Eosinophils Absolute: 0.1 10*3/uL (ref 0.0–0.5)
Eosinophils Relative: 2 %
HCT: 30.7 % — ABNORMAL LOW (ref 39.0–52.0)
Hemoglobin: 9.9 g/dL — ABNORMAL LOW (ref 13.0–17.0)
Immature Granulocytes: 0 %
Lymphocytes Relative: 23 %
Lymphs Abs: 1.1 10*3/uL (ref 0.7–4.0)
MCH: 34.6 pg — ABNORMAL HIGH (ref 26.0–34.0)
MCHC: 32.2 g/dL (ref 30.0–36.0)
MCV: 107.3 fL — ABNORMAL HIGH (ref 80.0–100.0)
Monocytes Absolute: 0.5 10*3/uL (ref 0.1–1.0)
Monocytes Relative: 10 %
Neutro Abs: 3.2 10*3/uL (ref 1.7–7.7)
Neutrophils Relative %: 64 %
Platelet Count: 161 10*3/uL (ref 150–400)
RBC: 2.86 MIL/uL — ABNORMAL LOW (ref 4.22–5.81)
RDW: 14.1 % (ref 11.5–15.5)
WBC Count: 4.9 10*3/uL (ref 4.0–10.5)
nRBC: 0 % (ref 0.0–0.2)

## 2019-07-18 LAB — SAVE SMEAR(SSMR), FOR PROVIDER SLIDE REVIEW

## 2019-07-18 MED ORDER — SODIUM POLYSTYRENE SULFONATE PO POWD: Freq: Every day | ORAL | 0 refills | Status: AC

## 2019-07-18 NOTE — Telephone Encounter (Signed)
Called and spoke w/ wife regarding appointments added.  Also sent in basket message to Dr Maylon Peppers regarding medication that was to be called in to pharmacy.

## 2019-07-18 NOTE — Progress Notes (Signed)
[name] OFFICE PROGRESS NOTE  Patient Care Team: [name], MD as PCP - General [name], [name], MD as PCP - Cardiology (Cardiology) [name], MD as Referring Physician (Cardiology) [name], MD as Referring Physician (Nephrology)  HEME/ONC OVERVIEW: 1. Macrocytic anemia -Most likely multifactorial, including Stage IV CKD and hypothyroidism -Hgb between 9 and 10   MM work-up unremarkable in 2016  Nutritional studies unremarkable except mild iron deficiency   2. Intermittent thrombocytopenia  -Plts between 110k and normal   PERTINENT NON-HEM/ONC PROBLEMS: 1. 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 A-fib on amiodarone and Eliquis   ASSESSMENT & PLAN:   Macrocytic anemia -Most likely multifactorial, including Stage IV CKD and hypothyroidism -Nutritional studies unremarkable except mild iron deficiency  -Given the increased risk of bleeding due to being on anticoagulation for A-fib, hx of gastritis on bx from EGD over 10 years ago, and unexplained iron deficiency, I recommended further GI evaluation to rule out any occult bleeding -He is tentatively scheduled for EGD and colonoscopy in early 08/2019 -Hgb 9.9 today, slightly improving  -I have ordered repeat iron panel to assess for any improvement on oral iron supplement -If he remains persistently iron deficient, he may benefit from IV iron infusion -We discussed some of the risks, benefits, and alternatives of intravenous iron infusions.  -Some of the side-effects to be expected including risks of infusion reactions, phlebitis, headaches, nausea and fatigue.   -Due to the patient's difficulty with transportation, they would like to defer IV iron infusion at this time, and to check with his PCP to see if IV iron infusion can be done there  -Continue B12 and iron supplement daily   -Unless he  develops progressive anemia, we can hold off bone marrow bx  Hyperkalemia -Secondary to Stage IV CKD -S/p Kayexalate in 05/2019  -K 5.7 today, significantly elevated -Patient is asymptomatic -I have prescribed Kayexalate 15g daily x 5 days, and instructed the patient to follow up with his PCP early next week to monitor K level -In addition, I provided the patient's spouse with a list of diet that contains high and low potassium content, and instructed him to adhere to a low potassium diet -Finally, I instructed the patient to contact his nephrologist for further management of his CKD and related electrolyte abnormalities  Stage IV CKD -Secondary to HTN -Cr 2.47 today, stable -See the management of hyperkalemia above -Due to the recurrent hyperkalemia secondary to CKD, patient is instructed to contact his nephrologist ASAP for further management  Orders Placed This Encounter  Procedures  . CBC with Differential (Cancer Center Only)    Standing Status:   Future    Standing Expiration Date:   08/21/2020  . Save Smear (SSMR)    Standing Status:   Future    Standing Expiration Date:   07/17/2020  . CMP ([place] only)    Standing Status:   Future    Standing Expiration Date:   08/21/2020  . Ferritin    Standing Status:   Future    Standing Expiration Date:   08/21/2020  . Iron and TIBC    Standing Status:   Future    Standing Expiration Date:   08/21/2020   All questions were answered. The patient knows to call the clinic with any problems, questions or concerns. No barriers to learning was detected.  A total of more  than 25 minutes were spent face-to-face with the patient during this encounter and over half of that time was spent on counseling and coordination of care as outlined above.   Return in 3 months for labs and clinic follow-up.   [name], MD 07/18/2019 10:27 AM  CHIEF COMPLAINT: "They don't let my wife in here"  INTERVAL HISTORY: Roberto Fields returns to clinic  for follow-up of macrocytic anemia.  He was slightly frustrated that his wife was not allowed to be with him due to the clinic policy in the setting of COVID outbreak, but his wife was arrived in the room after the patient cannot recall specific details regarding his healthcare.  Per his spouse, he recently saw Dr. Lyndel Safe of gastroenterology for Hemoccult positive stool, and he has been scheduled for colonoscopy/EGD in early 08/2019.  He currently takes iron supplement daily without significant GI upset or constipation.  He is also followed by nephrology at [facility], next appointment 09/2019, but he has not been given any instructions on monitoring dietary potassium intake, despite having recurrent hyperkalemia.  He denies any other complaint today.  REVIEW OF SYSTEMS:   Constitutional: ( - ) fevers, ( - )  chills , ( - ) night sweats Eyes: ( - ) blurriness of vision, ( - ) double vision, ( - ) watery eyes Ears, nose, mouth, throat, and face: ( - ) mucositis, ( - ) sore throat Respiratory: ( - ) cough, ( - ) dyspnea, ( - ) wheezes Cardiovascular: ( - ) palpitation, ( - ) chest discomfort, ( - ) lower extremity swelling Gastrointestinal:  ( - ) nausea, ( - ) heartburn, ( - ) change in bowel habits Skin: ( - ) abnormal skin rashes Lymphatics: ( - ) new lymphadenopathy, ( - ) easy bruising Neurological: ( - ) numbness, ( - ) tingling, ( - ) new weaknesses Behavioral/Psych: ( - ) mood change, ( - ) new changes  All other systems were reviewed with the patient and are negative.  I have reviewed the past medical history, past surgical history, social history and family history with the patient and they are unchanged from previous note.  ALLERGIES:  is allergic to atorvastatin; nsaids; colesevelam; pravastatin; and simvastatin.  MEDICATIONS:  Current Outpatient Medications  Medication Sig Dispense Refill  . albuterol (PROVENTIL HFA;VENTOLIN HFA) 108 (90 Base) MCG/ACT inhaler Inhale 2 puffs into the  lungs every 6 (six) hours as needed for wheezing. 18 g 4  . amLODipine (NORVASC) 5 MG tablet Take 1 tablet (5 mg total) by mouth daily. 90 tablet 3  . Cyanocobalamin (VITAMIN B-12 PO) Take 1 tablet by mouth daily.    Marland Kitchen ELIQUIS 5 MG TABS tablet Take 5 mg by mouth daily.     . Ferrous Sulfate (IRON SUPPLEMENT PO) Take 1 tablet by mouth daily.    Marland Kitchen gabapentin (NEURONTIN) 300 MG capsule Take 300 mg by mouth 3 (three) times daily.    . Latanoprost (XELPROS) 0.005 % EMUL Apply 1 drop to eye daily.    Marland Kitchen levothyroxine (SYNTHROID) 88 MCG tablet Take 1 tablet (88 mcg total) by mouth daily. 90 tablet 1  . meclizine (ANTIVERT) 25 MG tablet TAKE 1 TABLET BY MOUTH THREE TIMES DAILY AS NEEDED FOR DIZZINESS 90 tablet 0  . niacin (NIASPAN) 500 MG CR tablet Take 500 mg by mouth daily.     Marland Kitchen omeprazole (PRILOSEC) 40 MG capsule Take 1 capsule (40 mg total) by mouth daily. 90 capsule 3  .  telmisartan (MICARDIS) 80 MG tablet Take 1 tablet by mouth once daily 90 tablet 1  . timolol (TIMOPTIC) 0.5 % ophthalmic solution 1 drop 2 (two) times daily.    . vitamin B-12 (CYANOCOBALAMIN) 1000 MCG tablet Take 1 tablet by mouth daily.    . nitroGLYCERIN (NITROSTAT) 0.4 MG SL tablet DISSOLVE ONE TABLET UNDER THE TONGUE EVERY 5 MINUTES AS NEEDED FOR CHEST PAIN (Patient not taking: Reported on 07/18/2019) 100 tablet prn  . sodium polystyrene (KAYEXALATE) powder Take by mouth daily for 5 days. 15g daily x 5 days 75 g 0   No current facility-administered medications for this visit.     PHYSICAL EXAMINATION: ECOG PERFORMANCE STATUS: 2 - Symptomatic, <50% confined to bed  Today's Vitals   07/18/19 1012  BP: (!) 134/42  Pulse: 90  Resp: 18  Temp: (!) 97.1 F (36.2 C)  TempSrc: Temporal  SpO2: 100%  Weight: 164 lb 1.9 oz (74.4 kg)  Height: 5\' 8"  (1.727 m)  PainSc: 0-No pain   Body mass index is 24.95 kg/m.  Filed Weights   07/18/19 1012  Weight: 164 lb 1.9 oz (74.4 kg)    GENERAL: alert, no distress and comfortable,  older than stated age  SKIN: skin color, texture, turgor are normal, no rashes or significant lesions EYES: conjunctiva are pink and non-injected, sclera clear OROPHARYNX: no exudate, no erythema; lips, buccal mucosa, and tongue normal  NECK: supple, non-tender LUNGS: clear to auscultation with normal breathing effort HEART: regular rate & rhythm and no murmurs and no lower extremity edema ABDOMEN: soft, non-tender, non-distended, normal bowel sounds Musculoskeletal: no cyanosis of digits and no clubbing  PSYCH: alert, slowed speech NEURO: no focal motor/sensory deficits  LABORATORY DATA:  I have reviewed the data as listed    Component Value Date/Time   NA 141 07/18/2019 0925   NA 142 01/05/2017   K 5.7 (H) 07/18/2019 0925   CL 110 07/18/2019 0925   CO2 25 07/18/2019 0925   GLUCOSE 103 (H) 07/18/2019 0925   BUN 35 (H) 07/18/2019 0925   BUN 24 (A) 01/05/2017   CREATININE 2.47 (H) 07/18/2019 0925   CREATININE 2.26 (H) 05/24/2019 0946   CALCIUM 9.6 07/18/2019 0925   CALCIUM 9.5 09/19/2014   PROT 6.7 07/18/2019 0925   ALBUMIN 4.3 07/18/2019 0925   AST 10 (L) 07/18/2019 0925   ALT 7 07/18/2019 0925   ALKPHOS 118 07/18/2019 0925   BILITOT 0.4 07/18/2019 0925   GFRNONAA 25 (L) 07/18/2019 0925   GFRNONAA 27 (L) 05/24/2019 0946   GFRAA 28 (L) 07/18/2019 0925   GFRAA 32 (L) 05/24/2019 0946    No results found for: SPEP, UPEP  Lab Results  Component Value Date   WBC 4.9 07/18/2019   NEUTROABS 3.2 07/18/2019   HGB 9.9 (L) 07/18/2019   HCT 30.7 (L) 07/18/2019   MCV 107.3 (H) 07/18/2019   PLT 161 07/18/2019      Chemistry      Component Value Date/Time   NA 141 07/18/2019 0925   NA 142 01/05/2017   K 5.7 (H) 07/18/2019 0925   CL 110 07/18/2019 0925   CO2 25 07/18/2019 0925   BUN 35 (H) 07/18/2019 0925   BUN 24 (A) 01/05/2017   CREATININE 2.47 (H) 07/18/2019 0925   CREATININE 2.26 (H) 05/24/2019 0946   GLU 95 01/05/2017      Component Value Date/Time   CALCIUM 9.6  07/18/2019 0925   CALCIUM 9.5 09/19/2014   ALKPHOS 118 07/18/2019 0925  AST 10 (L) 07/18/2019 0925   ALT 7 07/18/2019 0925   BILITOT 0.4 07/18/2019 0925

## 2019-07-20 ENCOUNTER — Telehealth: Payer: Self-pay | Admitting: *Deleted

## 2019-07-20 ENCOUNTER — Encounter: Payer: Self-pay | Admitting: Family Medicine

## 2019-07-20 DIAGNOSIS — H401133 Primary open-angle glaucoma, bilateral, severe stage: Secondary | ICD-10-CM | POA: Diagnosis not present

## 2019-07-20 DIAGNOSIS — H16223 Keratoconjunctivitis sicca, not specified as Sjogren's, bilateral: Secondary | ICD-10-CM | POA: Diagnosis not present

## 2019-07-20 DIAGNOSIS — H02054 Trichiasis without entropian left upper eyelid: Secondary | ICD-10-CM | POA: Diagnosis not present

## 2019-07-20 NOTE — Telephone Encounter (Signed)
Error

## 2019-07-20 NOTE — Telephone Encounter (Signed)
Call received from patient's wife requesting iron results.  Pt.'s wife informed per order of Dr. Maylon Peppers that iron results were not checked at pt.'s last appt, for pt to continue taking iron supplement daily and that iron levels will be checked at patient's next appt.  Pt.'s wife appreciative of information and has no further questions or concerns at this time.

## 2019-07-25 ENCOUNTER — Ambulatory Visit (INDEPENDENT_AMBULATORY_CARE_PROVIDER_SITE_OTHER): Payer: PPO | Admitting: Family Medicine

## 2019-07-25 ENCOUNTER — Encounter: Payer: Self-pay | Admitting: Family Medicine

## 2019-07-25 ENCOUNTER — Other Ambulatory Visit: Payer: Self-pay

## 2019-07-25 VITALS — BP 118/43 | HR 46 | Temp 97.7°F | Ht 68.0 in | Wt 166.0 lb

## 2019-07-25 DIAGNOSIS — E875 Hyperkalemia: Secondary | ICD-10-CM | POA: Diagnosis not present

## 2019-07-25 DIAGNOSIS — I10 Essential (primary) hypertension: Secondary | ICD-10-CM

## 2019-07-25 DIAGNOSIS — D649 Anemia, unspecified: Secondary | ICD-10-CM

## 2019-07-25 DIAGNOSIS — Z23 Encounter for immunization: Secondary | ICD-10-CM | POA: Diagnosis not present

## 2019-07-25 DIAGNOSIS — N184 Chronic kidney disease, stage 4 (severe): Secondary | ICD-10-CM

## 2019-07-25 NOTE — Assessment & Plan Note (Signed)
Plan to recheck early next week. Will cut the ARB in half which can contribute to hyperkalemia.

## 2019-07-25 NOTE — Patient Instructions (Addendum)
Cut the telmistartan in half.      Food Basics for Chronic Kidney Disease When your kidneys are not working well, they cannot remove waste and excess substances from your blood as effectively as they did before. This can lead to a buildup and imbalance of these substances, which can worsen kidney damage and affect how your body functions. Certain foods lead to a buildup of these substances in the body. By changing your diet as recommended by your diet and nutrition specialist (dietitian) or health care provider, you could help prevent further kidney damage and delay or prevent the need for dialysis. What are tips for following this plan? General instructions   Work with your health care provider and dietitian to develop a meal plan that is right for you. Foods you can eat, limit, or avoid will be different for each person depending on the stage of kidney disease and any other existing health conditions.  Talk with your health care provider about whether you should take a vitamin and mineral supplement.  Use standard measuring cups and spoons to measure servings of foods. Use a kitchen scale to measure portions of protein foods.  If directed by your health care provider, avoid drinking too much fluid. Measure and count all liquids, including water, ice, soups, flavored gelatin, and frozen desserts such as popsicles or ice cream. Reading food labels  Check the amount of sodium in foods. Choose foods that have less than 300 milligrams (mg) per serving.  Check the ingredient list for phosphorus or potassium-based additives or preservatives.  Check the amount of saturated and trans fat. Limit or avoid these fats as told by your dietitian. Shopping  Avoid buying foods that are: ? Processed, frozen, or prepackaged. ? Calcium-enriched or fortified.  Do not buy foods that have salt or sodium listed among the first five ingredients.  Do not buy canned vegetables. Cooking  Replace animal  proteins, such as meat, fish, eggs, or dairy, with plant proteins from beans, nuts, and soy. ? Use soy milk instead of cow's milk. ? Add beans or tofu to soups, casseroles, or pasta dishes instead of meat.  Soak vegetables, such as potatoes, before cooking to reduce potassium. To do this: ? Peel and cut into small pieces. ? Soak in warm water for at least 2 hours. For every 1 cup of vegetables, use 10 cups of water. ? Drain and rinse with warm water. ? Boil for at least 5 minutes. Meal planning  Limit the amount of protein from plant and animal sources you eat each day.  Do not add salt to food when cooking or before eating.  Eat meals and snacks at around the same time each day. If you have diabetes:  If you have diabetes (diabetes mellitus) and chronic kidney disease, it is important to keep your blood glucose in the target range recommended by your health care provider. Follow your diabetes management plan. This may include: ? Checking your blood glucose regularly. ? Taking oral medicines, insulin, or both. ? Exercising for at least 30 minutes on 5 or more days each week, or as told by your health care provider. ? Tracking how many servings of carbohydrates you eat at each meal.  You may be given specific guidelines on how much of certain foods and nutrients you may eat, depending on your stage of kidney disease and whether you have high blood pressure (hypertension). Follow your meal plan as told by your dietitian. What nutrients should be limited? The items  listed are not a complete list. Talk with your dietitian about what dietary choices are best for you. Potassium Potassium affects how steadily your heart beats. If too much potassium builds up in your blood, it can cause an irregular heartbeat or even a heart attack. You may need to eat less potassium, depending on your blood potassium levels and the stage of kidney disease. Talk to your dietitian about how much potassium you may  have each day. You may need to limit or avoid foods that are high in potassium, such as:  Milk and soy milk.  Fruits, such as bananas, papaya, apricots, nectarines, melon, prunes, raisins, kiwi, and oranges.  Vegetables, such as potatoes, sweet potatoes, yams, tomatoes, leafy greens, beets, okra, avocado, pumpkin, and winter squash.  White and lima beans. Phosphorus Phosphorus is a mineral found in your bones. A balance between calcium and phosphorous is needed to build and maintain healthy bones. Too much phosphorus pulls calcium from your bones. This can make your bones weak and more likely to break. Too much phosphorus can also make your skin itch. You may need to eat less phosphorus depending on your blood phosphorus levels and the stage of kidney disease. Talk to your dietitian about how much potassium you may have each day. You may need to take medicine to lower your blood phosphorus levels if diet changes do not help. You may need to limit or avoid foods that are high in phosphorus, such as:  Milk and dairy products.  Dried beans and peas.  Tofu, soy milk, and other soy-based meat replacements.  Colas.  Nuts and peanut butter.  Meat, poultry, and fish.  Bran cereals and oatmeals. Protein Protein helps you to make and keep muscle. It also helps in the repair of your body's cells and tissues. One of the natural breakdown products of protein is a waste product called urea. When your kidneys are not working properly, they cannot remove wastes, such as urea, like they did before you developed chronic kidney disease. Reducing how much protein you eat can help prevent a buildup of urea in your blood. Depending on your stage of kidney disease, you may need to limit foods that are high in protein. Sources of animal protein include:  Meat (all types).  Fish and seafood.  Poultry.  Eggs.  Dairy. Other protein foods include:  Beans and legumes.  Nuts and nut butter.  Soy and  tofu. Sodium Sodium, which is found in salt, helps maintain a healthy balance of fluids in your body. Too much sodium can increase your blood pressure and have a negative effect on the function of your heart and lungs. Too much sodium can also cause your body to retain too much fluid, making your kidneys work harder. Most people should have less than 2,300 milligrams (mg) of sodium each day. If you have hypertension, you may need to limit your sodium to 1,500 mg each day. Talk to your dietitian about how much sodium you may have each day. You may need to limit or avoid foods that are high in sodium, such as:  Salt seasonings.  Soy sauce.  Cured and processed meats.  Salted crackers and snack foods.  Fast food.  Canned soups and most canned foods.  Pickled foods.  Vegetable juice.  Boxed mixes or ready-to-eat boxed meals and side dishes.  Bottled dressings, sauces, and marinades. Summary  Chronic kidney disease can lead to a buildup and imbalance of waste and excess substances in the body. Certain  foods lead to a buildup of these substances. By adjusting your intake of these foods, you could help prevent more kidney damage and delay or prevent the need for dialysis.  Food adjustments are different for each person with chronic kidney disease. Work with a dietitian to set up nutrient goals and a meal plan that is right for you.  If you have diabetes and chronic kidney disease, it is important to keep your blood glucose in the target range recommended by your health care provider. This information is not intended to replace advice given to you by your health care provider. Make sure you discuss any questions you have with your health care provider. Document Released: 01/16/2003 Document Revised: 02/16/2019 Document Reviewed: 10/21/2016 Elsevier Patient Education  [place].  Hyperkalemia Hyperkalemia occurs when the level of potassium in your blood is too high. Potassium is  an important nutrient that helps the muscles and nerves function normally. It affects how the heart works, and it helps keep fluids and minerals balanced in the body. If there is too much potassium in your blood, it can affect your heart's ability to function normally. Potassium is normally removed (excreted) from the body by the kidneys. Hyperkalemia can result from various conditions. It can range from mild to severe. What are the causes? This condition may be caused by:  Taking in too much potassium. You can do this by: ? Using salt substitutes. They contain large amounts of potassium. ? Taking potassium supplements. ? Eating foods that are high in potassium.  Excreting too little potassium. This can happen if: ? Your kidneys are not working properly. Kidney (renal) disease, including short-term or long-term renal failure, is a common cause of hyperkalemia. ? You are taking medicines that lower your excretion of potassium. ? You have Addison's disease. ? You have a urinary tract blockage, such as kidney stones. ? You are on treatment to mechanically clean your blood (dialysis) and you skip a treatment.  Releasing a high amount of potassium from your cells into your blood. This can happen with: ? Injury to muscles (rhabdomyolysis) or other tissues. Most potassium is stored in your muscles. ? Severe burns or infections. ? Acidic blood plasma (acidosis). Acidosis can result from many diseases, such as uncontrolled diabetes. What increases the risk? The following factors may make you more likely to develop this condition:  Kidney disease. This puts you at the highest risk.  Addison's disease. This is a condition where the adrenal glands do not produce enough hormones.  Alcoholism or heavy drug use.  Using certain blood pressure medicines, such as ACE inhibitors, angiotensin II receptor blockers (ARBs), or potassium-sparing diuretics such as spironolactone.  Severe injury or burn. What  are the signs or symptoms? In many cases, there are no symptoms. However, when your potassium level becomes high enough, you may have symptoms such as:  An irregular or very slow heartbeat.  Nausea.  Tiredness (fatigue).  Confusion.  Tingling of your skin or numbness of your hands or feet.  Muscle cramps.  Muscle weakness.  Not being able to move (paralysis). How is this diagnosed? This condition may be diagnosed based on:  Your symptoms and medical history. Your health care provider will ask about your use of prescription and non-prescription drugs.  A physical exam.  Blood tests.  An electrocardiogram (ECG). How is this treated? Treatment depends on the cause and severity of your condition. Treatment may need to be done in the hospital setting. Treatment may include:  IV glucose (sugar) along with insulin to shift potassium out of your blood and into your cells.  A medicine called albuterol to shift potassium out of your blood and into your cells.  Medicines to remove the potassium from your body.  Dialysis to remove the potassium from your body.  Calcium to protect your heart from the effects of high potassium, such as irregular rhythms (arrhythmias). Follow these instructions at home:   Take over-the-counter and prescription medicines only as told by your health care provider.  Do not take any supplements, natural products, herbs, or vitamins without reviewing them with your health care provider. Certain supplements and natural food products contain high amounts of potassium.  Limit your alcohol intake as told by your health care provider.  Do not use drugs. If you need help quitting, ask your health care provider.  If you have kidney disease, you may need to follow a low-potassium diet. A dietitian can help you learn which foods have high or low amounts of potassium.  Keep all follow-up visits as told by your health care provider. This is important. Contact  a health care provider if you:  Have an irregular or very slow heartbeat.  Feel light-headed.  Feel weak.  Are nauseous.  Have tingling or numbness in your hands or feet. Get help right away if you:  Have shortness of breath.  Have chest pain or discomfort.  Pass out.  Have muscle paralysis. Summary  Hyperkalemia occurs when the level of potassium in your blood is too high.  This condition may be caused by taking in too much potassium, excreting too little potassium, or releasing a high amount of potassium from your cells into your blood.  Hyperkalemia can result from many underlying conditions, especially chronic kidney disease, or from taking certain medicines.  Treatment of hyperkalemia may include medicine to shift potassium out of your blood and into your cells or to remove the potassium from your body.  If you have kidney disease, you may need to follow a low-potassium diet. A dietitian can help you learn which foods have high or low amounts of potassium. This information is not intended to replace advice given to you by your health care provider. Make sure you discuss any questions you have with your health care provider. Document Released: 10/16/2002 Document Revised: 10/11/2017 Document Reviewed: 10/11/2017 Elsevier Patient Education  [place].

## 2019-07-25 NOTE — Assessment & Plan Note (Addendum)
Cut the telmistartan in half. BP looks great but is on lower end for his age.  Follow BP for the next 2-3 weeks.

## 2019-07-25 NOTE — Progress Notes (Signed)
Established Patient Office Visit  Subjective:  Patient ID: Roberto Fields, male    DOB: 1945/12/04  Age: 73 y.o. MRN: [id number]  CC:  Chief Complaint  Patient presents with  . Abnormal Lab    elevated potassium    HPI TEREN ZURCHER presents for   F/U abnormal potasium levels felt to be secondary to stage IV kidney disease.. Completed treatment yesterday with Kayexalate.  He and his wife had questions about abnormal potassium.   Would like to check iron levels.  He did seem hematology. Still taking oral iron. He hasn't had colonoscopy performed yet but is scheduled in early Oct.    Past Medical History:  Diagnosis Date  . Arthritis   . Atrial fibrillation ([place])   . BPH (benign prostatic hyperplasia)   . CAD (coronary artery disease)   . Cataract    bilateral cataracts removed  . COPD (chronic obstructive pulmonary disease) ([place])   . CVA (cerebral infarction)   . History of blood clots   . History of colon polyps   . Hyperlipidemia   . Hypertension   . Hypothyroid   . Renal insufficiency   . Stomach ulcer   . Stroke ([place])   . Ulcer 1992   HX peptic ulcer- DX: by endoscopy    Past Surgical History:  Procedure Laterality Date  . arm surgery    . COLONOSCOPY     Dr Lavina Hamman in [place]. Dr Unk Lightning removed a larger polyp  . CORONARY STENT PLACEMENT  04/2011   LAD, [facility]  . ESOPHAGOGASTRODUODENOSCOPY     dr Lavina Hamman in [place]  . EYE SURGERY    . HERNIA MESH REMOVAL    . [place]  . TRANSURETHRAL RESECTION OF PROSTATE      Family History  Problem Relation Age of Onset  . Heart disease Father   . Coronary artery disease Brother   . Heart attack Brother 36       pacemaker and defibrillator  . Coronary artery disease Brother   . Hypertension Other        family history of  . Heart disease Sister   . Colon cancer Neg Hx   . Esophageal cancer Neg Hx     Social History   Socioeconomic History  . Marital  status: Married    Spouse name: Not on file  . Number of children: 3  . Years of education: Not on file  . Highest education level: Not on file  Occupational History  . Occupation: Retired  Scientific laboratory technician  . Financial resource strain: Not on file  . Food insecurity    Worry: Not on file    Inability: Not on file  . Transportation needs    Medical: Not on file    Non-medical: Not on file  Tobacco Use  . Smoking status: Former Research scientist (life sciences)  . Smokeless tobacco: Never Used  Substance and Sexual Activity  . Alcohol use: No  . Drug use: No  . Sexual activity: Not on file  Lifestyle  . Physical activity    Days per week: Not on file    Minutes per session: Not on file  . Stress: Not on file  Relationships  . Social Herbalist on phone: Not on file    Gets together: Not on file    Attends religious service: Not on file    Active member of club or organization: Not on file  Attends meetings of clubs or organizations: Not on file    Relationship status: Not on file  . Intimate partner violence    Fear of current or ex partner: Not on file    Emotionally abused: Not on file    Physically abused: Not on file    Forced sexual activity: Not on file  Other Topics Concern  . Not on file  Social History Narrative  . Not on file    Outpatient Medications Prior to Visit  Medication Sig Dispense Refill  . albuterol (PROVENTIL HFA;VENTOLIN HFA) 108 (90 Base) MCG/ACT inhaler Inhale 2 puffs into the lungs every 6 (six) hours as needed for wheezing. 18 g 4  . amLODipine (NORVASC) 5 MG tablet Take 1 tablet (5 mg total) by mouth daily. 90 tablet 3  . ELIQUIS 5 MG TABS tablet Take 5 mg by mouth daily.     . Ferrous Sulfate (IRON SUPPLEMENT PO) Take 1 tablet by mouth daily.    Marland Kitchen gabapentin (NEURONTIN) 300 MG capsule Take 300 mg by mouth 3 (three) times daily.    . Latanoprost (XELPROS) 0.005 % EMUL Apply 1 drop to eye daily.    Marland Kitchen levothyroxine (SYNTHROID) 88 MCG tablet Take 1 tablet  (88 mcg total) by mouth daily. 90 tablet 1  . meclizine (ANTIVERT) 25 MG tablet TAKE 1 TABLET BY MOUTH THREE TIMES DAILY AS NEEDED FOR DIZZINESS 90 tablet 0  . niacin (NIASPAN) 500 MG CR tablet Take 500 mg by mouth daily.     . nitroGLYCERIN (NITROSTAT) 0.4 MG SL tablet DISSOLVE ONE TABLET UNDER THE TONGUE EVERY 5 MINUTES AS NEEDED FOR CHEST PAIN 100 tablet prn  . omeprazole (PRILOSEC) 40 MG capsule Take 1 capsule (40 mg total) by mouth daily. 90 capsule 3  . telmisartan (MICARDIS) 80 MG tablet Take 1 tablet by mouth once daily 90 tablet 1  . timolol (TIMOPTIC) 0.5 % ophthalmic solution 1 drop 2 (two) times daily.    . vitamin B-12 (CYANOCOBALAMIN) 1000 MCG tablet Take 1 tablet by mouth daily.    . Cyanocobalamin (VITAMIN B-12 PO) Take 1 tablet by mouth daily.     No facility-administered medications prior to visit.     Allergies  Allergen Reactions  . Atorvastatin Other (See Comments)    REACTION: Muscle weakness  . Nsaids Other (See Comments)    Avoid with current kidney function.    . Colesevelam Other (See Comments)    REACTION: joint pain  . Pravastatin Other (See Comments)    Muscle aches  . Simvastatin Other (See Comments)    REACTION: Myalgias    ROS Review of Systems    Objective:    Physical Exam  BP (!) 118/43   Pulse (!) 46   Temp 97.7 F (36.5 C)   Ht 5\' 8"  (1.727 m)   Wt 166 lb (75.3 kg)   SpO2 100%   BMI 25.24 kg/m  Wt Readings from Last 3 Encounters:  07/25/19 166 lb (75.3 kg)  07/18/19 164 lb 1.9 oz (74.4 kg)  06/19/19 167 lb 2 oz (75.8 kg)     Health Maintenance Due  Topic Date Due  . COLONOSCOPY  04/30/2019    There are no preventive care reminders to display for this patient.  Lab Results  Component Value Date   TSH 2.22 01/18/2019   Lab Results  Component Value Date   WBC 4.9 07/18/2019   HGB 9.9 (L) 07/18/2019   HCT 30.7 (L) 07/18/2019   MCV 107.3 (  H) 07/18/2019   PLT 161 07/18/2019   Lab Results  Component Value Date   NA  141 07/18/2019   K 5.7 (H) 07/18/2019   CO2 25 07/18/2019   GLUCOSE 103 (H) 07/18/2019   BUN 35 (H) 07/18/2019   CREATININE 2.47 (H) 07/18/2019   BILITOT 0.4 07/18/2019   ALKPHOS 118 07/18/2019   AST 10 (L) 07/18/2019   ALT 7 07/18/2019   PROT 6.7 07/18/2019   ALBUMIN 4.3 07/18/2019   CALCIUM 9.6 07/18/2019   ANIONGAP 6 07/18/2019   Lab Results  Component Value Date   CHOL 185 08/05/2018   Lab Results  Component Value Date   HDL 37 (L) 08/05/2018   Lab Results  Component Value Date   LDLCALC 126 (H) 08/05/2018   Lab Results  Component Value Date   TRIG 113 08/05/2018   Lab Results  Component Value Date   CHOLHDL 5.0 (H) 08/05/2018   Lab Results  Component Value Date   HGBA1C 5.5 08/26/2015      Assessment & Plan:   Problem List Items Addressed This Visit      Cardiovascular and Mediastinum   HYPERTENSION, BENIGN ESSENTIAL    Cut the telmistartan in half. BP looks great but is on lower end for his age.  Follow BP for the next 2-3 weeks.          Genitourinary   CKD (chronic kidney disease) stage 4, GFR 15-29 ml/min (HCC)     Other   Hyperkalemia due to CKD 4 - Primary    Plan to recheck early next week. Will cut the ARB in half which can contribute to hyperkalemia.       Relevant Orders   Fe+TIBC+Fer   BASIC METABOLIC PANEL WITH GFR    Other Visit Diagnoses    Need for immunization against influenza       Relevant Orders   Flu Vaccine QUAD High Dose(Fluad) (Completed)   Fe+TIBC+Fer   BASIC METABOLIC PANEL WITH GFR   Low hemoglobin       Relevant Orders   Fe+TIBC+Fer   BASIC METABOLIC PANEL WITH GFR     If he needs to have IV iron done he would like to have it done here in [place] if at all possible.  So we will have to see once we get his lab results.  He does have follow-up coming up with GI soon as well.  No orders of the defined types were placed in this encounter.   Follow-up: Return in about 4 months (around 11/24/2019).     Beatrice Lecher, MD

## 2019-08-03 DIAGNOSIS — D649 Anemia, unspecified: Secondary | ICD-10-CM | POA: Diagnosis not present

## 2019-08-03 DIAGNOSIS — Z23 Encounter for immunization: Secondary | ICD-10-CM | POA: Diagnosis not present

## 2019-08-03 DIAGNOSIS — E875 Hyperkalemia: Secondary | ICD-10-CM | POA: Diagnosis not present

## 2019-08-04 ENCOUNTER — Other Ambulatory Visit: Payer: Self-pay | Admitting: Hematology

## 2019-08-04 DIAGNOSIS — N184 Chronic kidney disease, stage 4 (severe): Secondary | ICD-10-CM

## 2019-08-04 DIAGNOSIS — D539 Nutritional anemia, unspecified: Secondary | ICD-10-CM

## 2019-08-04 LAB — BASIC METABOLIC PANEL WITH GFR
BUN/Creatinine Ratio: 12 (calc) (ref 6–22)
BUN: 27 mg/dL — ABNORMAL HIGH (ref 7–25)
CO2: 24 mmol/L (ref 20–32)
Calcium: 8.8 mg/dL (ref 8.6–10.3)
Chloride: 109 mmol/L (ref 98–110)
Creat: 2.18 mg/dL — ABNORMAL HIGH (ref 0.70–1.18)
GFR, Est African American: 33 mL/min/{1.73_m2} — ABNORMAL LOW (ref >=60)
GFR, Est Non African American: 29 mL/min/{1.73_m2} — ABNORMAL LOW (ref >=60)
Glucose, Bld: 111 mg/dL — ABNORMAL HIGH (ref 65–99)
Potassium: 5.2 mmol/L (ref 3.5–5.3)
Sodium: 141 mmol/L (ref 135–146)

## 2019-08-04 LAB — IRON,TIBC AND FERRITIN PANEL
%SAT: 51 % (calc) — ABNORMAL HIGH (ref 20–48)
Ferritin: 27 ng/mL (ref 24–380)
Iron: 164 ug/dL (ref 50–180)
TIBC: 320 mcg/dL (calc) (ref 250–425)

## 2019-08-07 NOTE — Progress Notes (Signed)
HPI: FUCAD and PAF. Previously cared for at [facility]. Catheterization 2013 showed a 99% obtuse marginal and 70% PDA. Patient had PCI of his marginal. Note based on records his disease was predominantly distal and not amenable to revascularization. Carotid Doppler September 2014 showed 1-39% bilateral stenosis. Echocardiogram September 2014 showed normal LV systolic function, Mild left ventricular hypertrophy and mild aortic insufficiency. Patient also with history of atrial fibrillation.Nuclear study September 2017 showed mild inferolateral ischemia and ejection fraction 52%. Study felt to be low risk and we are treating medically.Renal ultrasound 2016 showed no aneurysm. Recent monitor May 02, 2019 showed sinus bradycardia with PACs, PVCs and brief PAT.  Heart rate as low as 37.  Amiodarone discontinued.  Since last seen,patient has mild dyspnea on exertion but no orthopnea, PND, palpitations, syncope.  He denies chest pain.  Minimal pedal edema.  Current Outpatient Medications  Medication Sig Dispense Refill  . albuterol (PROVENTIL HFA;VENTOLIN HFA) 108 (90 Base) MCG/ACT inhaler Inhale 2 puffs into the lungs every 6 (six) hours as needed for wheezing. 18 g 4  . amLODipine (NORVASC) 5 MG tablet Take 1 tablet (5 mg total) by mouth daily. 90 tablet 3  . ELIQUIS 5 MG TABS tablet Take 5 mg by mouth 2 (two) times daily.     . Ferrous Sulfate (IRON SUPPLEMENT PO) Take 1 tablet by mouth daily.    Marland Kitchen gabapentin (NEURONTIN) 300 MG capsule Take 300 mg by mouth 3 (three) times daily.    . Latanoprost (XELPROS) 0.005 % EMUL Apply 1 drop to eye daily.    Marland Kitchen levothyroxine (SYNTHROID) 88 MCG tablet Take 1 tablet (88 mcg total) by mouth daily. 90 tablet 1  . meclizine (ANTIVERT) 25 MG tablet TAKE 1 TABLET BY MOUTH THREE TIMES DAILY AS NEEDED FOR DIZZINESS 90 tablet 0  . niacin (NIASPAN) 500 MG CR tablet Take 500 mg by mouth daily.     . nitroGLYCERIN (NITROSTAT) 0.4 MG SL tablet DISSOLVE ONE  TABLET UNDER THE TONGUE EVERY 5 MINUTES AS NEEDED FOR CHEST PAIN 100 tablet prn  . omeprazole (PRILOSEC) 40 MG capsule Take 1 capsule (40 mg total) by mouth daily. 90 capsule 3  . telmisartan (MICARDIS) 80 MG tablet Take 1 tablet by mouth once daily 90 tablet 1  . timolol (TIMOPTIC) 0.5 % ophthalmic solution 1 drop 2 (two) times daily.    . vitamin B-12 (CYANOCOBALAMIN) 1000 MCG tablet Take 1 tablet by mouth daily.     No current facility-administered medications for this visit.      Past Medical History:  Diagnosis Date  . Arthritis   . Atrial fibrillation ([place])   . BPH (benign prostatic hyperplasia)   . CAD (coronary artery disease)   . Cataract    bilateral cataracts removed  . COPD (chronic obstructive pulmonary disease) ([place])   . CVA (cerebral infarction)   . History of blood clots   . History of colon polyps   . Hyperlipidemia   . Hypertension   . Hypothyroid   . Renal insufficiency   . Stomach ulcer   . Stroke ([place])   . Ulcer 1992   HX peptic ulcer- DX: by endoscopy    Past Surgical History:  Procedure Laterality Date  . arm surgery    . COLONOSCOPY     Dr Lavina Hamman in [place]. Dr Unk Lightning removed a larger polyp  . CORONARY STENT PLACEMENT  04/2011   [facility]AD, [facility]  . ESOPHAGOGASTRODUODENOSCOPY  dr Lavina Hamman in [place]  . EYE SURGERY    . HERNIA MESH REMOVAL    . [place]  . TRANSURETHRAL RESECTION OF PROSTATE      Social History   Socioeconomic History  . Marital status: Married    Spouse name: Not on file  . Number of children: 3  . Years of education: Not on file  . Highest education level: Not on file  Occupational History  . Occupation: Retired  Scientific laboratory technician  . Financial resource strain: Not on file  . Food insecurity    Worry: Not on file    Inability: Not on file  . Transportation needs    Medical: Not on file    Non-medical: Not on file  Tobacco Use  . Smoking status: Former Research scientist (life sciences)  .  Smokeless tobacco: Never Used  Substance and Sexual Activity  . Alcohol use: No  . Drug use: No  . Sexual activity: Not on file  Lifestyle  . Physical activity    Days per week: Not on file    Minutes per session: Not on file  . Stress: Not on file  Relationships  . Social Herbalist on phone: Not on file    Gets together: Not on file    Attends religious service: Not on file    Active member of club or organization: Not on file    Attends meetings of clubs or organizations: Not on file    Relationship status: Not on file  . Intimate partner violence    Fear of current or ex partner: Not on file    Emotionally abused: Not on file    Physically abused: Not on file    Forced sexual activity: Not on file  Other Topics Concern  . Not on file  Social History Narrative  . Not on file    Family History  Problem Relation Age of Onset  . Heart disease Father   . Coronary artery disease Brother   . Heart attack Brother 36       pacemaker and defibrillator  . Coronary artery disease Brother   . Hypertension Other        family history of  . Heart disease Sister   . Colon cancer Neg Hx   . Esophageal cancer Neg Hx     ROS: no fevers or chills, productive cough, hemoptysis, dysphasia, odynophagia, melena, hematochezia, dysuria, hematuria, rash, seizure activity, orthopnea, PND, claudication. Remaining systems are negative.  Physical Exam: Well-developed well-nourished in no acute distress.  Skin is warm and dry.  HEENT is normal.  Neck is supple.  Chest is clear to auscultation with normal expansion.  Cardiovascular exam is regular rate and rhythm.  Abdominal exam nontender or distended. No masses palpated. Extremities show no edema. neuro grossly intact  A/P  1 paroxysmal atrial fibrillation-remains in sinus on exam. Plan to continue apixaban.  Amiodarone discontinued because of bradycardia.  Follow-up for recurrent atrial arrhythmias.  2 bradycardia-improved  following discontinuation of amiodarone.  3 coronary artery disease-no chest pain.  Continue medical therapy.  He is not on aspirin given need for anticoagulation.  Intolerant to statins.  4 hyperlipidemia-intolerant to statins and Zetia and not willing to try Repatha.  5 hypertension-blood pressure controlled.  Continue present medications.  6 chronic stage III kidney disease-followed by nephrology.  Kirk Ruths, MD

## 2019-08-09 ENCOUNTER — Other Ambulatory Visit: Payer: Self-pay

## 2019-08-09 ENCOUNTER — Ambulatory Visit (INDEPENDENT_AMBULATORY_CARE_PROVIDER_SITE_OTHER): Payer: PPO | Admitting: Cardiology

## 2019-08-09 ENCOUNTER — Encounter: Payer: Self-pay | Admitting: Cardiology

## 2019-08-09 VITALS — BP 114/55 | HR 52 | Ht 68.0 in | Wt 164.4 lb

## 2019-08-09 DIAGNOSIS — I2510 Atherosclerotic heart disease of native coronary artery without angina pectoris: Secondary | ICD-10-CM | POA: Diagnosis not present

## 2019-08-09 DIAGNOSIS — I10 Essential (primary) hypertension: Secondary | ICD-10-CM | POA: Diagnosis not present

## 2019-08-09 DIAGNOSIS — E7800 Pure hypercholesterolemia, unspecified: Secondary | ICD-10-CM | POA: Diagnosis not present

## 2019-08-09 DIAGNOSIS — I480 Paroxysmal atrial fibrillation: Secondary | ICD-10-CM

## 2019-08-09 NOTE — Patient Instructions (Signed)
Your physician wants you to follow-up in: 6 MONTHS WITH DR CRENSHAW You will receive a reminder letter in the mail two months in advance. If you don't receive a letter, please call our office to schedule the follow-up appointment.  

## 2019-08-14 ENCOUNTER — Ambulatory Visit (AMBULATORY_SURGERY_CENTER): Payer: PPO | Admitting: *Deleted

## 2019-08-14 VITALS — Ht 68.0 in | Wt 160.0 lb

## 2019-08-14 DIAGNOSIS — K219 Gastro-esophageal reflux disease without esophagitis: Secondary | ICD-10-CM

## 2019-08-14 DIAGNOSIS — Z86010 Personal history of colonic polyps: Secondary | ICD-10-CM

## 2019-08-14 DIAGNOSIS — R195 Other fecal abnormalities: Secondary | ICD-10-CM

## 2019-08-14 DIAGNOSIS — D500 Iron deficiency anemia secondary to blood loss (chronic): Secondary | ICD-10-CM

## 2019-08-14 NOTE — Progress Notes (Signed)
Patient's pre-visit was done today over the phone with the patient due to COVID-19 pandemic. Name,DOB and address verified. Insurance verified. Packet of Prep instructions mailed to patient including copy of a consent form-pt is aware. Patient understands to call us back with any questions or concerns.   Patient denies any allergies to eggs or soy. Patient denies any problems with anesthesia/sedation. Patient denies any oxygen use at home. Patient denies taking any diet/weight loss medications. Patient is not being treated for MRSA or C-diff. EMMI education assisgned to patient on EGD& colonoscopy, this was explained and instructions given to patient.   Pt and  Wife is aware that care partner will wait in the car during procedure; if they feel like they will be too hot to wait in the car; they may wait in the lobby. Patient is aware to bring only one care partner. We want them to wear a mask (we do not have any that we can provide them), practice social distancing, and we will check their temperatures when they get here.  I did remind patient that their care partner needs to stay in the parking lot the entire time. Pt will wear mask into building.

## 2019-08-16 ENCOUNTER — Encounter: Payer: Self-pay | Admitting: Gastroenterology

## 2019-08-25 ENCOUNTER — Telehealth: Payer: Self-pay | Admitting: Gastroenterology

## 2019-08-28 ENCOUNTER — Ambulatory Visit: Payer: PPO | Admitting: Family Medicine

## 2019-08-28 ENCOUNTER — Encounter: Payer: Self-pay | Admitting: Gastroenterology

## 2019-08-28 ENCOUNTER — Other Ambulatory Visit: Payer: Self-pay

## 2019-08-28 ENCOUNTER — Ambulatory Visit (AMBULATORY_SURGERY_CENTER): Payer: PPO | Admitting: Gastroenterology

## 2019-08-28 VITALS — BP 135/78 | HR 46 | Temp 97.4°F | Resp 14 | Ht 68.0 in | Wt 160.0 lb

## 2019-08-28 DIAGNOSIS — I2510 Atherosclerotic heart disease of native coronary artery without angina pectoris: Secondary | ICD-10-CM | POA: Diagnosis not present

## 2019-08-28 DIAGNOSIS — K219 Gastro-esophageal reflux disease without esophagitis: Secondary | ICD-10-CM | POA: Diagnosis not present

## 2019-08-28 DIAGNOSIS — I10 Essential (primary) hypertension: Secondary | ICD-10-CM | POA: Diagnosis not present

## 2019-08-28 DIAGNOSIS — R195 Other fecal abnormalities: Secondary | ICD-10-CM | POA: Diagnosis not present

## 2019-08-28 DIAGNOSIS — D125 Benign neoplasm of sigmoid colon: Secondary | ICD-10-CM

## 2019-08-28 DIAGNOSIS — K621 Rectal polyp: Secondary | ICD-10-CM

## 2019-08-28 DIAGNOSIS — D509 Iron deficiency anemia, unspecified: Secondary | ICD-10-CM

## 2019-08-28 DIAGNOSIS — K317 Polyp of stomach and duodenum: Secondary | ICD-10-CM | POA: Diagnosis not present

## 2019-08-28 DIAGNOSIS — J449 Chronic obstructive pulmonary disease, unspecified: Secondary | ICD-10-CM | POA: Diagnosis not present

## 2019-08-28 DIAGNOSIS — K635 Polyp of colon: Secondary | ICD-10-CM | POA: Diagnosis not present

## 2019-08-28 DIAGNOSIS — Z8673 Personal history of transient ischemic attack (TIA), and cerebral infarction without residual deficits: Secondary | ICD-10-CM

## 2019-08-28 DIAGNOSIS — D124 Benign neoplasm of descending colon: Secondary | ICD-10-CM

## 2019-08-28 DIAGNOSIS — D128 Benign neoplasm of rectum: Secondary | ICD-10-CM

## 2019-08-28 DIAGNOSIS — I4891 Unspecified atrial fibrillation: Secondary | ICD-10-CM | POA: Diagnosis not present

## 2019-08-28 DIAGNOSIS — Z86010 Personal history of colonic polyps: Secondary | ICD-10-CM | POA: Diagnosis not present

## 2019-08-28 DIAGNOSIS — I480 Paroxysmal atrial fibrillation: Secondary | ICD-10-CM

## 2019-08-28 MED ORDER — SODIUM CHLORIDE 0.9 % IV SOLN: 500.0000 mL | Freq: Once | INTRAVENOUS | Status: DC

## 2019-08-28 NOTE — Progress Notes (Signed)
Wife assisted with questions during admitting process

## 2019-08-28 NOTE — Progress Notes (Signed)
Pt's states no medical or surgical changes since previsit or office visit.  CW - vs   Covid questions and temp by [employer]

## 2019-08-28 NOTE — Progress Notes (Signed)
Called to room to assist during endoscopic procedure.  Patient ID and intended procedure confirmed with present staff. Received instructions for my participation in the procedure from the performing physician.  

## 2019-08-28 NOTE — Progress Notes (Signed)
Adm vs by CW  [place] temp & covid questions-front desk

## 2019-08-28 NOTE — Progress Notes (Signed)
Report to PACU, RN, vss, BBS= Clear.  

## 2019-08-28 NOTE — Op Note (Signed)
[facility] Patient Name: Roberto Fields Procedure Date: 08/28/2019 10:06 AM MRN: [id number] Endoscopist: Jackquline Denmark , MD Age: 73 Referring MD:  Date of Birth: 08-10-1946 Gender: Male Account #: [id number] Procedure:                Upper GI endoscopy Indications:              Heme positive stool Medicines:                Monitored Anesthesia Care Procedure:                Pre-Anesthesia Assessment:                           - Prior to the procedure, a History and Physical                            was performed, and patient medications and                            allergies were reviewed. The patient's tolerance of                            previous anesthesia was also reviewed. The risks                            and benefits of the procedure and the sedation                            options and risks were discussed with the patient.                            All questions were answered, and informed consent                            was obtained. Prior Anticoagulants: The patient has                            taken Eliquis (apixaban), last dose was 2 days                            prior to procedure. ASA Grade Assessment: III - A                            patient with severe systemic disease. After                            reviewing the risks and benefits, the patient was                            deemed in satisfactory condition to undergo the                            procedure.  After obtaining informed consent, the endoscope was                            passed under direct vision. Throughout the                            procedure, the patient's blood pressure, pulse, and                            oxygen saturations were monitored continuously. The                            Endoscope was introduced through the mouth, and                            advanced to the second part of duodenum. The upper    GI endoscopy was accomplished without difficulty.                            The patient tolerated the procedure well. Scope In: Scope Out: Findings:                 The examined esophagus was normal with regular                            Z-line at 35 cm. Examined by NBI.                           The gastric folds were slightly more prominent than                            the body of the stomach. No erosions or ulcers.                            Multiple biopsies were obtained.                           10-12, 4 to 6 mm sessile polyps with no bleeding                            and no stigmata of recent bleeding were found in                            the gastric body. 3 polyps were removed with a cold                            biopsy forceps to confirm hyperplastic nature.                           The examined duodenum was normal. Biopsies for                            histology were taken with a cold forceps for  evaluation of celiac disease. Complications:            No immediate complications. Estimated Blood Loss:     Estimated blood loss: none. Impression:               -Mildly prominent gastric folds (? Etiology)                            -biopsied.                           -Incidental gastric polyps (s/p polypectomy x 3)                           -No active upper GI bleeding. Recommendation:           - Patient has a contact number available for                            emergencies. The signs and symptoms of potential                            delayed complications were discussed with the                            patient. Return to normal activities tomorrow.                            Written discharge instructions were provided to the                            patient.                           - Resume previous diet.                           - Continue present medications.                           - Await pathology results. Jackquline Denmark, MD 08/28/2019 11:02:15 AM This report has been signed electronically.

## 2019-08-28 NOTE — Patient Instructions (Signed)
HANDOUTS GIVEN : POLYPS, DIVERTICULOSIS AND HEMORRHOIDS  HOLD ELIQUIS FOR 2 DAYS, NEXT DOSE 08/30/2019, Wednesday.    YOU HAD AN ENDOSCOPIC PROCEDURE TODAY AT [facility]:   Refer to the procedure report that was given to you for any specific questions about what was found during the examination.  If the procedure report does not answer your questions, please call your gastroenterologist to clarify.  If you requested that your care partner not be given the details of your procedure findings, then the procedure report has been included in a sealed envelope for you to review at your convenience later.  YOU SHOULD EXPECT: Some feelings of bloating in the abdomen. Passage of more gas than usual.  Walking can help get rid of the air that was put into your GI tract during the procedure and reduce the bloating. If you had a lower endoscopy (such as a colonoscopy or flexible sigmoidoscopy) you may notice spotting of blood in your stool or on the toilet paper. If you underwent a bowel prep for your procedure, you may not have a normal bowel movement for a few days.  Please Note:  You might notice some irritation and congestion in your nose or some drainage.  This is from the oxygen used during your procedure.  There is no need for concern and it should clear up in a day or so.  SYMPTOMS TO REPORT IMMEDIATELY:   Following lower endoscopy (colonoscopy or flexible sigmoidoscopy):  Excessive amounts of blood in the stool  Significant tenderness or worsening of abdominal pains  Swelling of the abdomen that is new, acute  Fever of 100F or higher   Following upper endoscopy (EGD)  Vomiting of blood or coffee ground material  New chest pain or pain under the shoulder blades  Painful or persistently difficult swallowing  New shortness of breath  Fever of 100F or higher  Black, tarry-looking stools  For urgent or emergent issues, a gastroenterologist can be reached at any hour by  calling [phone].   DIET:  We do recommend a small meal at first, but then you may proceed to your regular diet.  Drink plenty of fluids but you should avoid alcoholic beverages for 24 hours.  ACTIVITY:  You should plan to take it easy for the rest of today and you should NOT DRIVE or use heavy machinery until tomorrow (because of the sedation medicines used during the test).    FOLLOW UP: Our staff will call the number listed on your records 48-72 hours following your procedure to check on you and address any questions or concerns that you may have regarding the information given to you following your procedure. If we do not reach you, we will leave a message.  We will attempt to reach you two times.  During this call, we will ask if you have developed any symptoms of COVID 19. If you develop any symptoms (ie: fever, flu-like symptoms, shortness of breath, cough etc.) before then, please call [phone].  If you test positive for Covid 19 in the 2 weeks post procedure, please call and report this information to [place].    If any biopsies were taken you will be contacted by phone or by letter within the next 1-3 weeks.  Please call us at [phone] if you have not heard about the biopsies in 3 weeks.    SIGNATURES/CONFIDENTIALITY: You and/or your care partner have signed paperwork which will be entered into your electronic medical record.  These  signatures attest to the fact that that the information above on your After Visit Summary has been reviewed and is understood.  Full responsibility of the confidentiality of this discharge information lies with you and/or your care-partner. 

## 2019-08-28 NOTE — Op Note (Signed)
[place] Patient Name: Roberto Fields Procedure Date: 08/28/2019 10:05 AM MRN: [id number] Endoscopist: Jackquline Denmark , MD Age: 73 Referring MD:  Date of Birth: [birth year] Gender: Male Account #: [id number] Procedure:                Colonoscopy Indications:              1. Heme positive stools. 2. IDA. 3. History of                            advanced colonic polyps at [facility]                            trans-anal resection (serrated adenoma). Patient                            did not go for FU colonoscopies. Medicines:                Monitored Anesthesia Care Procedure:                Pre-Anesthesia Assessment:                           - Prior to the procedure, a History and Physical                            was performed, and patient medications and                            allergies were reviewed. The patient's tolerance of                            previous anesthesia was also reviewed. The risks                            and benefits of the procedure and the sedation                            options and risks were discussed with the patient.                            All questions were answered, and informed consent                            was obtained. Prior Anticoagulants: The patient has                            taken Eliquis (apixaban), last dose was 2 days                            prior to procedure. ASA Grade Assessment: III - A                            patient with severe systemic disease. After  reviewing the risks and benefits, the patient was                            deemed in satisfactory condition to undergo the                            procedure.                           After obtaining informed consent, the colonoscope                            was passed under direct vision. Throughout the                            procedure, the patient's blood pressure, pulse, and   oxygen saturations were monitored continuously. The                            Colonoscope was introduced through the anus and                            advanced to the 2 cm into the ileum. The                            colonoscopy was performed without difficulty. The                            patient tolerated the procedure well. The quality                            of the bowel preparation was good. The terminal                            ileum, ileocecal valve, appendiceal orifice, and                            rectum were photographed. Scope In: 10:29:02 AM Scope Out: 10:54:51 AM Scope Withdrawal Time: 0 hours 21 minutes 0 seconds  Total Procedure Duration: 0 hours 25 minutes 49 seconds  Findings:                 A 2 cm fungating, friable and ulcerated                            non-obstructing mass was found in the distal                            sigmoid colon, 25 cm from the anal verge involving                            1/4 th of circumference. This was biopsied with a                            cold forceps  for histology. Area was tattooed with                            an injection of 2 mL of [place] ink distal to the                            mass.                           A 10 mm polyp was found in the mid descending                            colon. The polyp was sessile. The polyp was removed                            with a hot snare. Resection and retrieval were                            complete.                           Six sessile polyps were found in the rectum. The                            polyps were 4 to 6 mm in size. These polyps were                            removed with a cold snare. Resection and retrieval                            were complete.                           Multiple small and large-mouthed diverticula were                            found in the sigmoid colon.                           Non-bleeding internal hemorrhoids were  found during                            retroflexion. The hemorrhoids were moderate.                           The terminal ileum appeared normal. Complications:            No immediate complications. Estimated Blood Loss:     Estimated blood loss: none. 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 Biopsied. Tattooed.                           -  One 10 mm polyp in the mid descending colon,                            removed with a hot snare. Resected and retrieved.                           - Six 4 to 6 mm polyps in the rectum, removed with                            a cold snare. Resected and retrieved.                           - Moderate to severe sigmoid diverticulosis. Recommendation:           - Patient has a contact number available for                            emergencies. The signs and symptoms of potential                            delayed complications were discussed with the                            patient. Return to normal activities tomorrow.                            Written discharge instructions were provided to the                            patient.                           - Resume previous diet.                           - Resume Eliquis (apixaban) at prior dose in 2 days.                           - Await pathology results.                           - Return to GI clinic in 2 weeks. Jackquline Denmark, MD 08/28/2019 11:16:04 AM This report has been signed electronically.

## 2019-08-28 NOTE — Addendum Note (Signed)
Addended by: Ernestine Conrad D on: 08/28/2019 01:15 PM   Modules accepted: Orders

## 2019-08-30 ENCOUNTER — Telehealth: Payer: Self-pay | Admitting: *Deleted

## 2019-08-30 NOTE — Telephone Encounter (Signed)
  Follow up Call-  Call back number 08/28/2019  Post procedure Call Back phone  # [phone]  Permission to leave phone message Yes  Some recent data might be hidden    Spoke with wife Patient questions:  Do you have a fever, pain , or abdominal swelling? No. Pain Score  0 *  Have you tolerated food without any problems? Yes.    Have you been able to return to your normal activities? Yes.    Do you have any questions about your discharge instructions: Diet   No. Medications  No. Follow up visit  No.  Do you have questions or concerns about your Care? No.  Actions: * If pain score is 4 or above: No action needed, pain <4.  1. Have you developed a fever since your procedure? no  2.   Have you had an respiratory symptoms (SOB or cough) since your procedure? no  3.   Have you tested positive for COVID 19 since your procedure no  4.   Have you had any family members/close contacts diagnosed with the COVID 19 since your procedure?  no   If yes to any of these questions please route to Joylene John, RN and Alphonsa Gin, Therapist, sports.    Spoke with wife.  She states that pt "is still in the bed.  He had a vertigo episode yesterday."  She states this is not new- pt does have these occasionally.  She states he has not passed any blood in his stool

## 2019-09-05 ENCOUNTER — Telehealth: Payer: Self-pay | Admitting: Gastroenterology

## 2019-09-05 ENCOUNTER — Other Ambulatory Visit: Payer: Self-pay | Admitting: Family Medicine

## 2019-09-05 DIAGNOSIS — D125 Benign neoplasm of sigmoid colon: Secondary | ICD-10-CM

## 2019-09-05 DIAGNOSIS — D128 Benign neoplasm of rectum: Secondary | ICD-10-CM

## 2019-09-05 DIAGNOSIS — D500 Iron deficiency anemia secondary to blood loss (chronic): Secondary | ICD-10-CM

## 2019-09-05 DIAGNOSIS — K317 Polyp of stomach and duodenum: Secondary | ICD-10-CM

## 2019-09-05 DIAGNOSIS — D124 Benign neoplasm of descending colon: Secondary | ICD-10-CM

## 2019-09-05 DIAGNOSIS — R195 Other fecal abnormalities: Secondary | ICD-10-CM

## 2019-09-05 NOTE — Telephone Encounter (Signed)
I have discussed in detail the biopsy results from colonoscopy. Biopsies showed-high-grade dysplasia. Colonoscopy is highly suggestive of underlying carcinoma (fixed, central ulcer, hard as determined by biopsy forceps)  Plan: -Check CBC, CMP, CEA level -Proceed with CT scan chest/Abdo/pelvis with p.o. and IV contrast -Appointment with Dr. Forestine Na in [place] (family would prefer Monday, but any day would be okay) for possible robotic sigmoid colectomy.  They would like to get surgery done in [facility].  It is closer to their home.   Bre, Please let me know when there is appointment with Dr. Amalia Hailey.  I will talk to him as well  Thx  RG

## 2019-09-05 NOTE — Telephone Encounter (Signed)
Please advise-Is this patient needing a CT scan performed for his care?

## 2019-09-06 ENCOUNTER — Other Ambulatory Visit (INDEPENDENT_AMBULATORY_CARE_PROVIDER_SITE_OTHER): Payer: PPO

## 2019-09-06 DIAGNOSIS — K317 Polyp of stomach and duodenum: Secondary | ICD-10-CM | POA: Diagnosis not present

## 2019-09-06 DIAGNOSIS — D124 Benign neoplasm of descending colon: Secondary | ICD-10-CM

## 2019-09-06 DIAGNOSIS — D125 Benign neoplasm of sigmoid colon: Secondary | ICD-10-CM | POA: Diagnosis not present

## 2019-09-06 DIAGNOSIS — R195 Other fecal abnormalities: Secondary | ICD-10-CM | POA: Diagnosis not present

## 2019-09-06 DIAGNOSIS — D128 Benign neoplasm of rectum: Secondary | ICD-10-CM

## 2019-09-06 DIAGNOSIS — D500 Iron deficiency anemia secondary to blood loss (chronic): Secondary | ICD-10-CM

## 2019-09-06 LAB — CBC WITH DIFFERENTIAL/PLATELET
Basophils Absolute: 0 10*3/uL (ref 0.0–0.1)
Basophils Relative: 0.7 % (ref 0.0–3.0)
Eosinophils Absolute: 0.1 10*3/uL (ref 0.0–0.7)
Eosinophils Relative: 2.3 % (ref 0.0–5.0)
HCT: 26.8 % — ABNORMAL LOW (ref 39.0–52.0)
Hemoglobin: 9.1 g/dL — ABNORMAL LOW (ref 13.0–17.0)
Lymphocytes Relative: 27.7 % (ref 12.0–46.0)
Lymphs Abs: 1.2 10*3/uL (ref 0.7–4.0)
MCHC: 34.2 g/dL (ref 30.0–36.0)
MCV: 104.9 fl — ABNORMAL HIGH (ref 78.0–100.0)
Monocytes Absolute: 0.5 10*3/uL (ref 0.1–1.0)
Monocytes Relative: 10.6 % (ref 3.0–12.0)
Neutro Abs: 2.6 10*3/uL (ref 1.4–7.7)
Neutrophils Relative %: 58.7 % (ref 43.0–77.0)
Platelets: 141 10*3/uL — ABNORMAL LOW (ref 150.0–400.0)
RBC: 2.55 Mil/uL — ABNORMAL LOW (ref 4.22–5.81)
RDW: 13.9 % (ref 11.5–15.5)
WBC: 4.4 10*3/uL (ref 4.0–10.5)

## 2019-09-06 LAB — COMPREHENSIVE METABOLIC PANEL
ALT: 6 U/L (ref 0–53)
AST: 10 U/L (ref 0–37)
Albumin: 4 g/dL (ref 3.5–5.2)
Alkaline Phosphatase: 104 U/L (ref 39–117)
BUN: 25 mg/dL — ABNORMAL HIGH (ref 6–23)
CO2: 27 mEq/L (ref 19–32)
Calcium: 9.1 mg/dL (ref 8.4–10.5)
Chloride: 110 mEq/L (ref 96–112)
Creatinine, Ser: 2.03 mg/dL — ABNORMAL HIGH (ref 0.40–1.50)
GFR: 32.14 mL/min — ABNORMAL LOW (ref >60.00)
Glucose, Bld: 124 mg/dL — ABNORMAL HIGH (ref 70–99)
Potassium: 4.7 mEq/L (ref 3.5–5.1)
Sodium: 142 mEq/L (ref 135–145)
Total Bilirubin: 0.4 mg/dL (ref 0.2–1.2)
Total Protein: 6.3 g/dL (ref 6.0–8.3)

## 2019-09-06 NOTE — Telephone Encounter (Signed)
Called and spoke with patient's wife-verified DPR-Roberto Fields was informed of MD recommendations and is agreeable with the plan of care and requested the CT scans be scheduled at [facility] and is aware of needed lab work prior to CT scans; Roberto Fields reports she will try to get her daughter to take the patient today to get the lab work completed-Roberto Fields was given the number to call to schedule the CT scans at [facility] [phone]); Roberto Fields advised that the referral has been sent to Dr. Amalia Hailey in [place]-surgery to be completed at [facility]; Roberto Fields was also advised to call back to the office should questions/concerns arise; Roberto Fields verbalized understanding of information/instructions;

## 2019-09-06 NOTE — Telephone Encounter (Signed)
09/05/2019=returning your call do not scheduled any appt for  09-18-19/09-25-19 pt has appt scheduled those days.

## 2019-09-06 NOTE — Telephone Encounter (Signed)
Please see additional documentation concerning this patient 

## 2019-09-07 LAB — CEA: CEA: 0.6 ng/mL

## 2019-09-12 ENCOUNTER — Ambulatory Visit (HOSPITAL_BASED_OUTPATIENT_CLINIC_OR_DEPARTMENT_OTHER)
Admission: RE | Admit: 2019-09-12 | Discharge: 2019-09-12 | Disposition: A | Discharge status: Home / Self Care | Payer: PPO | Source: Ambulatory Visit | Attending: Gastroenterology | Admitting: Gastroenterology

## 2019-09-12 ENCOUNTER — Other Ambulatory Visit: Payer: Self-pay

## 2019-09-12 DIAGNOSIS — D500 Iron deficiency anemia secondary to blood loss (chronic): Secondary | ICD-10-CM

## 2019-09-12 DIAGNOSIS — D124 Benign neoplasm of descending colon: Secondary | ICD-10-CM

## 2019-09-12 DIAGNOSIS — K317 Polyp of stomach and duodenum: Secondary | ICD-10-CM

## 2019-09-12 DIAGNOSIS — D125 Benign neoplasm of sigmoid colon: Secondary | ICD-10-CM

## 2019-09-12 DIAGNOSIS — D128 Benign neoplasm of rectum: Secondary | ICD-10-CM

## 2019-09-12 DIAGNOSIS — N400 Enlarged prostate without lower urinary tract symptoms: Secondary | ICD-10-CM | POA: Diagnosis not present

## 2019-09-12 DIAGNOSIS — R195 Other fecal abnormalities: Secondary | ICD-10-CM

## 2019-09-12 DIAGNOSIS — J439 Emphysema, unspecified: Secondary | ICD-10-CM | POA: Diagnosis not present

## 2019-09-13 ENCOUNTER — Encounter: Payer: Self-pay | Admitting: Family Medicine

## 2019-09-13 DIAGNOSIS — I2510 Atherosclerotic heart disease of native coronary artery without angina pectoris: Secondary | ICD-10-CM | POA: Diagnosis not present

## 2019-09-13 DIAGNOSIS — I4891 Unspecified atrial fibrillation: Secondary | ICD-10-CM | POA: Diagnosis not present

## 2019-09-13 DIAGNOSIS — D374 Neoplasm of uncertain behavior of colon: Secondary | ICD-10-CM | POA: Diagnosis not present

## 2019-09-13 DIAGNOSIS — D500 Iron deficiency anemia secondary to blood loss (chronic): Secondary | ICD-10-CM | POA: Diagnosis not present

## 2019-09-13 DIAGNOSIS — I700 Atherosclerosis of aorta: Secondary | ICD-10-CM | POA: Insufficient documentation

## 2019-09-15 ENCOUNTER — Ambulatory Visit: Payer: PPO | Admitting: Gastroenterology

## 2019-09-15 DIAGNOSIS — H401133 Primary open-angle glaucoma, bilateral, severe stage: Secondary | ICD-10-CM | POA: Diagnosis not present

## 2019-09-15 DIAGNOSIS — H02054 Trichiasis without entropian left upper eyelid: Secondary | ICD-10-CM | POA: Diagnosis not present

## 2019-09-15 DIAGNOSIS — H16223 Keratoconjunctivitis sicca, not specified as Sjogren's, bilateral: Secondary | ICD-10-CM | POA: Diagnosis not present

## 2019-09-18 DIAGNOSIS — D490 Neoplasm of unspecified behavior of digestive system: Secondary | ICD-10-CM | POA: Diagnosis not present

## 2019-09-18 DIAGNOSIS — Z20828 Contact with and (suspected) exposure to other viral communicable diseases: Secondary | ICD-10-CM | POA: Diagnosis not present

## 2019-09-18 DIAGNOSIS — Z01812 Encounter for preprocedural laboratory examination: Secondary | ICD-10-CM | POA: Diagnosis not present

## 2019-09-22 DIAGNOSIS — Z888 Allergy status to other drugs, medicaments and biological substances status: Secondary | ICD-10-CM | POA: Diagnosis not present

## 2019-09-22 DIAGNOSIS — Z806 Family history of leukemia: Secondary | ICD-10-CM | POA: Diagnosis not present

## 2019-09-22 DIAGNOSIS — I252 Old myocardial infarction: Secondary | ICD-10-CM | POA: Diagnosis not present

## 2019-09-22 DIAGNOSIS — D374 Neoplasm of uncertain behavior of colon: Secondary | ICD-10-CM | POA: Diagnosis not present

## 2019-09-22 DIAGNOSIS — Z955 Presence of coronary angioplasty implant and graft: Secondary | ICD-10-CM | POA: Diagnosis not present

## 2019-09-22 DIAGNOSIS — I4891 Unspecified atrial fibrillation: Secondary | ICD-10-CM | POA: Diagnosis not present

## 2019-09-22 DIAGNOSIS — K6389 Other specified diseases of intestine: Secondary | ICD-10-CM | POA: Diagnosis not present

## 2019-09-22 DIAGNOSIS — R001 Bradycardia, unspecified: Secondary | ICD-10-CM | POA: Diagnosis not present

## 2019-09-22 DIAGNOSIS — Z800 Family history of malignant neoplasm of digestive organs: Secondary | ICD-10-CM | POA: Diagnosis not present

## 2019-09-22 DIAGNOSIS — C218 Malignant neoplasm of overlapping sites of rectum, anus and anal canal: Secondary | ICD-10-CM | POA: Diagnosis not present

## 2019-09-22 DIAGNOSIS — Z79899 Other long term (current) drug therapy: Secondary | ICD-10-CM | POA: Diagnosis not present

## 2019-09-22 DIAGNOSIS — R011 Cardiac murmur, unspecified: Secondary | ICD-10-CM | POA: Diagnosis not present

## 2019-09-22 DIAGNOSIS — Z8673 Personal history of transient ischemic attack (TIA), and cerebral infarction without residual deficits: Secondary | ICD-10-CM | POA: Diagnosis not present

## 2019-09-22 DIAGNOSIS — N183 Chronic kidney disease, stage 3 unspecified: Secondary | ICD-10-CM | POA: Diagnosis not present

## 2019-09-22 DIAGNOSIS — D638 Anemia in other chronic diseases classified elsewhere: Secondary | ICD-10-CM | POA: Diagnosis not present

## 2019-09-22 DIAGNOSIS — J449 Chronic obstructive pulmonary disease, unspecified: Secondary | ICD-10-CM | POA: Diagnosis not present

## 2019-09-22 DIAGNOSIS — Z86718 Personal history of other venous thrombosis and embolism: Secondary | ICD-10-CM | POA: Diagnosis not present

## 2019-09-22 DIAGNOSIS — I130 Hypertensive heart and chronic kidney disease with heart failure and stage 1 through stage 4 chronic kidney disease, or unspecified chronic kidney disease: Secondary | ICD-10-CM | POA: Diagnosis not present

## 2019-09-22 DIAGNOSIS — I2510 Atherosclerotic heart disease of native coronary artery without angina pectoris: Secondary | ICD-10-CM | POA: Diagnosis not present

## 2019-09-22 DIAGNOSIS — D539 Nutritional anemia, unspecified: Secondary | ICD-10-CM | POA: Diagnosis not present

## 2019-09-22 DIAGNOSIS — H409 Unspecified glaucoma: Secondary | ICD-10-CM | POA: Diagnosis not present

## 2019-09-22 DIAGNOSIS — E785 Hyperlipidemia, unspecified: Secondary | ICD-10-CM | POA: Diagnosis not present

## 2019-09-22 DIAGNOSIS — Z20828 Contact with and (suspected) exposure to other viral communicable diseases: Secondary | ICD-10-CM | POA: Diagnosis not present

## 2019-09-22 DIAGNOSIS — Z7901 Long term (current) use of anticoagulants: Secondary | ICD-10-CM | POA: Diagnosis not present

## 2019-09-22 DIAGNOSIS — I509 Heart failure, unspecified: Secondary | ICD-10-CM | POA: Diagnosis not present

## 2019-09-22 DIAGNOSIS — D631 Anemia in chronic kidney disease: Secondary | ICD-10-CM | POA: Diagnosis not present

## 2019-09-22 DIAGNOSIS — Z87891 Personal history of nicotine dependence: Secondary | ICD-10-CM | POA: Diagnosis not present

## 2019-09-22 DIAGNOSIS — E039 Hypothyroidism, unspecified: Secondary | ICD-10-CM | POA: Diagnosis not present

## 2019-09-22 DIAGNOSIS — K219 Gastro-esophageal reflux disease without esophagitis: Secondary | ICD-10-CM | POA: Diagnosis not present

## 2019-09-22 DIAGNOSIS — Z803 Family history of malignant neoplasm of breast: Secondary | ICD-10-CM | POA: Diagnosis not present

## 2019-09-22 DIAGNOSIS — N184 Chronic kidney disease, stage 4 (severe): Secondary | ICD-10-CM | POA: Diagnosis not present

## 2019-09-22 DIAGNOSIS — C19 Malignant neoplasm of rectosigmoid junction: Secondary | ICD-10-CM | POA: Diagnosis not present

## 2019-09-27 MED ORDER — TIMOLOL MALEATE 0.5 % OP SOLN
1.00 | OPHTHALMIC | Status: DC
Start: 2019-09-27 — End: 2019-09-27

## 2019-09-27 MED ORDER — DEXAMETHASONE SODIUM PHOSPHATE 0.1 % OP SOLN
1.00 | OPHTHALMIC | Status: DC
Start: 2019-09-27 — End: 2019-09-27

## 2019-09-27 MED ORDER — QUINERVA 260 MG PO TABS
650.00 | ORAL_TABLET | ORAL | Status: DC
Start: 2019-09-27 — End: 2019-09-27

## 2019-09-27 MED ORDER — NITROGLYCERIN 0.4 MG SL SUBL
0.40 | SUBLINGUAL_TABLET | SUBLINGUAL | Status: DC
Start: ? — End: 2019-09-27

## 2019-09-27 MED ORDER — LOSARTAN POTASSIUM 25 MG PO TABS
12.50 | ORAL_TABLET | ORAL | Status: DC
Start: 2019-09-28 — End: 2019-09-27

## 2019-09-27 MED ORDER — OXYCODONE-ACETAMINOPHEN 5-325 MG PO TABS
2.00 | ORAL_TABLET | ORAL | Status: DC
Start: ? — End: 2019-09-27

## 2019-09-27 MED ORDER — WEIGHT LOSS DAILY MULTI PO TABS
5.00 | ORAL_TABLET | ORAL | Status: DC
Start: 2019-09-27 — End: 2019-09-27

## 2019-09-27 MED ORDER — MECLIZINE HCL 25 MG PO TABS
25.00 | ORAL_TABLET | ORAL | Status: DC
Start: ? — End: 2019-09-27

## 2019-09-27 MED ORDER — CARBOXYMETHYLCELLULOSE SODIUM 0.5 % OP SOLN
1.00 | OPHTHALMIC | Status: DC
Start: ? — End: 2019-09-27

## 2019-09-27 MED ORDER — PANTOPRAZOLE SODIUM 40 MG PO TBEC
40.00 | DELAYED_RELEASE_TABLET | ORAL | Status: DC
Start: 2019-09-28 — End: 2019-09-27

## 2019-09-27 MED ORDER — CVS CHILDRENS NASAL DECONGEST 15 MG/5ML PO LIQD
88.00 | ORAL | Status: DC
Start: 2019-09-28 — End: 2019-09-27

## 2019-09-27 MED ORDER — OXYCODONE-ACETAMINOPHEN 5-325 MG PO TABS
1.00 | ORAL_TABLET | ORAL | Status: DC
Start: ? — End: 2019-09-27

## 2019-09-27 MED ORDER — GABAPENTIN 300 MG PO CAPS
600.00 | ORAL_CAPSULE | ORAL | Status: DC
Start: 2019-09-27 — End: 2019-09-27

## 2019-09-27 MED ORDER — LOVENOX 150 MG/ML ~~LOC~~ SOLN
0.50 | SUBCUTANEOUS | Status: DC
Start: ? — End: 2019-09-27

## 2019-09-27 MED ORDER — LATANOPROST 0.005 % OP SOLN
1.00 | OPHTHALMIC | Status: DC
Start: 2019-09-28 — End: 2019-09-27

## 2019-09-27 MED ORDER — AMLODIPINE BESYLATE 5 MG PO TABS
5.00 | ORAL_TABLET | ORAL | Status: DC
Start: 2019-09-28 — End: 2019-09-27

## 2019-10-02 ENCOUNTER — Inpatient Hospital Stay: Payer: PPO | Admitting: Family Medicine

## 2019-10-09 ENCOUNTER — Ambulatory Visit: Payer: PPO | Admitting: Gastroenterology

## 2019-10-13 ENCOUNTER — Encounter: Payer: Self-pay | Admitting: *Deleted

## 2019-10-13 NOTE — Progress Notes (Signed)
Received notification from the scheduler that the patient's wife called and cancelled all follow up with Dr Maylon Peppers stating it was no longer needed after his recent colon surgery. Patient was recently found to have a stage I colon cancer and underwent surgical intervention. Prior to this finding, this office was follow for anemia.   Spoke to Dr Maylon Peppers who asked that I follow up with patient and schedule for follow up in 2-3 weeks to continue to follow anemia, but also discuss colon cancer surveillance.   Called and spoke to wife. She states that Dr Lyndel Safe and Dr Oletta Lamas both told her that her husband would need no further treatment for the colon cancer and they didn't need to follow up with oncology. She also stated that they told her that his anemia would straighten itself out, now that the surgery was done. She was instructed to keep him on the iron supplements. I did explain that the anemia, even if self corrected, would likely take time to correct and it would be good to come in for assessment.  She states they don't have the funds to continue to see all his specialists, and that his blood work will continue to be follow by his PCP and renal doctor. She again deferred scheduling any follow up with this office, but did agree to reach out if his anemia doesn't correct itself, or any further needs develop related to his cancer care.   Will notify Dr Maylon Peppers.

## 2019-10-16 ENCOUNTER — Encounter: Payer: Self-pay | Admitting: Family Medicine

## 2019-10-16 DIAGNOSIS — H16223 Keratoconjunctivitis sicca, not specified as Sjogren's, bilateral: Secondary | ICD-10-CM | POA: Diagnosis not present

## 2019-10-16 DIAGNOSIS — H02105 Unspecified ectropion of left lower eyelid: Secondary | ICD-10-CM | POA: Diagnosis not present

## 2019-10-16 DIAGNOSIS — H18832 Recurrent erosion of cornea, left eye: Secondary | ICD-10-CM | POA: Diagnosis not present

## 2019-10-16 NOTE — Progress Notes (Signed)
Thank you for checking on the patient. As long as he has reliable PCP and GI follow-up for colon cancer surveillance, he can be referred back to [place] as needed.  Dr. Maylon Peppers

## 2019-10-17 ENCOUNTER — Ambulatory Visit: Payer: PPO | Admitting: Hematology

## 2019-10-17 ENCOUNTER — Other Ambulatory Visit: Payer: PPO

## 2019-10-24 DIAGNOSIS — N1831 Chronic kidney disease, stage 3a: Secondary | ICD-10-CM | POA: Diagnosis not present

## 2019-11-09 ENCOUNTER — Encounter: Payer: Self-pay | Admitting: Family Medicine

## 2019-11-09 DIAGNOSIS — H401133 Primary open-angle glaucoma, bilateral, severe stage: Secondary | ICD-10-CM | POA: Diagnosis not present

## 2019-11-09 DIAGNOSIS — H02105 Unspecified ectropion of left lower eyelid: Secondary | ICD-10-CM | POA: Diagnosis not present

## 2019-11-09 DIAGNOSIS — H16223 Keratoconjunctivitis sicca, not specified as Sjogren's, bilateral: Secondary | ICD-10-CM | POA: Diagnosis not present

## 2019-11-13 ENCOUNTER — Other Ambulatory Visit: Payer: Self-pay | Admitting: Cardiology

## 2019-11-13 ENCOUNTER — Other Ambulatory Visit: Payer: Self-pay | Admitting: Family Medicine

## 2019-11-27 ENCOUNTER — Other Ambulatory Visit: Payer: Self-pay

## 2019-11-27 ENCOUNTER — Encounter: Payer: Self-pay | Admitting: Family Medicine

## 2019-11-27 ENCOUNTER — Ambulatory Visit (INDEPENDENT_AMBULATORY_CARE_PROVIDER_SITE_OTHER): Payer: PPO | Admitting: Family Medicine

## 2019-11-27 VITALS — BP 116/44 | HR 63 | Ht 68.0 in | Wt 161.0 lb

## 2019-11-27 DIAGNOSIS — I10 Essential (primary) hypertension: Secondary | ICD-10-CM

## 2019-11-27 DIAGNOSIS — I2510 Atherosclerotic heart disease of native coronary artery without angina pectoris: Secondary | ICD-10-CM | POA: Diagnosis not present

## 2019-11-27 DIAGNOSIS — D490 Neoplasm of unspecified behavior of digestive system: Secondary | ICD-10-CM | POA: Diagnosis not present

## 2019-11-27 DIAGNOSIS — E039 Hypothyroidism, unspecified: Secondary | ICD-10-CM | POA: Diagnosis not present

## 2019-11-27 DIAGNOSIS — N184 Chronic kidney disease, stage 4 (severe): Secondary | ICD-10-CM | POA: Diagnosis not present

## 2019-11-27 MED ORDER — LEVOTHYROXINE SODIUM 88 MCG PO TABS: 88.0000 ug | ORAL_TABLET | Freq: Every day | ORAL | 1 refills | Status: DC

## 2019-11-27 NOTE — Progress Notes (Signed)
Established Patient Office Visit  Subjective:  Patient ID: Roberto Fields, male    DOB: 1946-08-17  Age: 74 y.o. MRN: [id number]  CC:  Chief Complaint  Patient presents with  . Hypertension    HPI Roberto Fields presents for   Hypertension- Pt denies chest pain, SOB, dizziness, or heart palpitations.  Taking meds as directed w/o problems.  Denies medication side effects.    Recently underwent a partial colectomy for colon cancer.  He says he is actually doing really well.  His appetite is back to normal he feels like his bowels are moving pretty regularly.  His wife did want me to examine his wounds.  She feels like the larger incision on that left lower quadrant area is a little bit swollen.  But she has not noticed any redness or fevers.  Follow-up CKD-currently following with nephrology.  Felt to be secondary to longstanding hypertension.  Aten running between 1.7 and 2.0.  GFR estimated to be around 36.  He does have some persistent proteinuria.  Ongoing yearly currently.  Past Medical History:  Diagnosis Date  . Arthritis   . Atrial fibrillation ([place])   . BPH (benign prostatic hyperplasia)   . CAD (coronary artery disease)   . Cataract    bilateral cataracts removed  . COPD (chronic obstructive pulmonary disease) ([place])   . CVA (cerebral infarction)   . History of blood clots   . History of colon polyps   . Hyperlipidemia   . Hypertension   . Hypothyroid   . Myocardial infarction ([place]) 2010  . Renal insufficiency    stage 4   . Stomach ulcer   . Stroke ([place]) 2010  . Ulcer 1992   HX peptic ulcer- DX: by endoscopy    Past Surgical History:  Procedure Laterality Date  . arm surgery    . COLONOSCOPY     Dr Lavina Hamman in [place]. Dr Unk Lightning removed a larger polyp  . CORONARY STENT PLACEMENT  04/2011   [facility], [facility]  . ESOPHAGOGASTRODUODENOSCOPY     dr Lavina Hamman in [place]  . EYE SURGERY    . HERNIA MESH REMOVAL    . [place]  . TRANSURETHRAL RESECTION OF PROSTATE      Family History  Problem Relation Age of Onset  . Heart disease Father   . Heart attack Father   . Coronary artery disease Brother   . Heart attack Brother 36       pacemaker and defibrillator  . Coronary artery disease Brother   . Leukemia Mother   . Hypertension Other        family history of  . Heart disease Sister   . Stomach cancer Sister   . Breast cancer Sister   . Lung cancer Sister   . Breast cancer Sister   . Lung cancer Sister   . Colon cancer Neg Hx   . Esophageal cancer Neg Hx   . Rectal cancer Neg Hx     Social History   Socioeconomic History  . Marital status: Married    Spouse name: Not on file  . Number of children: 3  . Years of education: Not on file  . Highest education level: Not on file  Occupational History  . Occupation: Retired  Tobacco Use  . Smoking status: Former Research scientist (life sciences)  . Smokeless tobacco: Never Used  Substance and Sexual Activity  . Alcohol use: No  . Drug use: No  .  Sexual activity: Not on file  Other Topics Concern  . Not on file  Social History Narrative  . Not on file   Social Determinants of Health   Financial Resource Strain:   . Difficulty of Paying Living Expenses: Not on file  Food Insecurity:   . Worried About Charity fundraiser in the Last Year: Not on file  . Ran Out of Food in the Last Year: Not on file  Transportation Needs:   . Lack of Transportation (Medical): Not on file  . Lack of Transportation (Non-Medical): Not on file  Physical Activity:   . Days of Exercise per Week: Not on file  . Minutes of Exercise per Session: Not on file  Stress:   . Feeling of Stress : Not on file  Social Connections:   . Frequency of Communication with Friends and Family: Not on file  . Frequency of Social Gatherings with Friends and Family: Not on file  . Attends Religious Services: Not on file  . Active Member of Clubs or Organizations: Not on file  . Attends Theatre manager Meetings: Not on file  . Marital Status: Not on file  Intimate Partner Violence:   . Fear of Current or Ex-Partner: Not on file  . Emotionally Abused: Not on file  . Physically Abused: Not on file  . Sexually Abused: Not on file    Outpatient Medications Prior to Visit  Medication Sig Dispense Refill  . Acetaminophen (TYLENOL PO) Take by mouth.    Marland Kitchen albuterol (PROVENTIL HFA;VENTOLIN HFA) 108 (90 Base) MCG/ACT inhaler Inhale 2 puffs into the lungs every 6 (six) hours as needed for wheezing. 18 g 4  . amLODipine (NORVASC) 5 MG tablet Take 1 tablet (5 mg total) by mouth daily. 90 tablet 3  . Docusate Calcium (STOOL SOFTENER PO) Take by mouth.    Arne Cleveland 5 MG TABS tablet Take 1 tablet by mouth twice daily 60 tablet 0  . Ferrous Sulfate (IRON SUPPLEMENT PO) Take 1 tablet by mouth daily.    Marland Kitchen gabapentin (NEURONTIN) 300 MG capsule Take 600 mg by mouth 2 (two) times daily.     . meclizine (ANTIVERT) 25 MG tablet TAKE 1 TABLET BY MOUTH THREE TIMES DAILY AS NEEDED FOR DIZZINESS 90 tablet 0  . niacin (NIASPAN) 500 MG CR tablet Take 500 mg by mouth daily.     . nitroGLYCERIN (NITROSTAT) 0.4 MG SL tablet DISSOLVE ONE TABLET UNDER THE TONGUE EVERY 5 MINUTES AS NEEDED FOR CHEST PAIN 100 tablet prn  . omeprazole (PRILOSEC) 40 MG capsule Take 1 capsule by mouth once daily 90 capsule 0  . REFRESH CELLUVISC 1 % GEL Apply 1 drop to eye 4 (four) times daily.    Marland Kitchen telmisartan (MICARDIS) 80 MG tablet Take 0.5 tablets (40 mg total) by mouth daily. 45 tablet 0  . timolol (TIMOPTIC) 0.5 % ophthalmic solution 1 drop 2 (two) times daily.    . vitamin B-12 (CYANOCOBALAMIN) 1000 MCG tablet Take 1 tablet by mouth daily.    Marland Kitchen levothyroxine (SYNTHROID) 88 MCG tablet Take 1 tablet (88 mcg total) by mouth daily. 90 tablet 1  . fluorometholone (FML) 0.1 % ophthalmic suspension INSTILL 1 DROP INTO EACH EYE ONCE DAILY    . Latanoprost (XELPROS) 0.005 % EMUL Apply 1 drop to eye daily.     No  facility-administered medications prior to visit.    Allergies  Allergen Reactions  . Atorvastatin Other (See Comments)    REACTION: Muscle weakness  .  Nsaids Other (See Comments)    Avoid with current kidney function.    . Colesevelam Other (See Comments)    REACTION: joint pain  . Pravastatin Other (See Comments)    Muscle aches  . Simvastatin Other (See Comments)    REACTION: Myalgias    ROS Review of Systems    Objective:    Physical Exam  Constitutional: He is oriented to person, place, and time. He appears well-developed and well-nourished.  HENT:  Head: Normocephalic and atraumatic.  Cardiovascular: Normal rate, regular rhythm and normal heart sounds.  Pulmonary/Chest: Effort normal and breath sounds normal.  Abdominal:  Several small incisions over the lower abdomen and upper abdomen.  There is a larger incision in the left lower quadrant I do palpate a sausage shaped seroma underneath the lesion.  The wounds have actually closed well.  No surrounding erythema no drainage.  Only mildly tender to exam.  Neurological: He is alert and oriented to person, place, and time.  Skin: Skin is warm and dry.  Psychiatric: He has a normal mood and affect. His behavior is normal.    BP (!) 116/44   Pulse 63   Ht 5\' 8"  (1.727 m)   Wt 161 lb (73 kg)   SpO2 100%   BMI 24.48 kg/m  Wt Readings from Last 3 Encounters:  11/27/19 161 lb (73 kg)  08/28/19 160 lb (72.6 kg)  08/14/19 160 lb (72.6 kg)     There are no preventive care reminders to display for this patient.  There are no preventive care reminders to display for this patient.  Lab Results  Component Value Date   TSH 2.22 01/18/2019   Lab Results  Component Value Date   WBC 4.4 09/06/2019   HGB 9.1 (L) 09/06/2019   HCT 26.8 (L) 09/06/2019   MCV 104.9 (H) 09/06/2019   PLT 141.0 (L) 09/06/2019   Lab Results  Component Value Date   NA 142 09/06/2019   K 4.7 09/06/2019   CO2 27 09/06/2019   GLUCOSE 124  (H) 09/06/2019   BUN 25 (H) 09/06/2019   CREATININE 2.03 (H) 09/06/2019   BILITOT 0.4 09/06/2019   ALKPHOS 104 09/06/2019   AST 10 09/06/2019   ALT 6 09/06/2019   PROT 6.3 09/06/2019   ALBUMIN 4.0 09/06/2019   CALCIUM 9.1 09/06/2019   ANIONGAP 6 07/18/2019   GFR 32.14 (L) 09/06/2019   Lab Results  Component Value Date   CHOL 185 08/05/2018   Lab Results  Component Value Date   HDL 37 (L) 08/05/2018   Lab Results  Component Value Date   LDLCALC 126 (H) 08/05/2018   Lab Results  Component Value Date   TRIG 113 08/05/2018   Lab Results  Component Value Date   CHOLHDL 5.0 (H) 08/05/2018   Lab Results  Component Value Date   HGBA1C 5.5 08/26/2015      Assessment & Plan:   Problem List Items Addressed This Visit      Cardiovascular and Mediastinum   HYPERTENSION, BENIGN ESSENTIAL - Primary    Well controlled. Continue current regimen. Follow up in  6  mo      CAD (coronary artery disease)    Due to recheck lipid levels.  We will try to get that done when we checked the thyroid.        Digestive   Colonic neoplasm    Covering well.  Incisions actually look really good.  I do believe he probably has a seroma  underneath the largest incision in that left lower quadrant.  But it should eventually resolve.  If it starts to cause increase in pain or does not seem to be resolving then please follow-up with the surgeon.        Endocrine   Hypothyroidism    He is due for thyroid check in March.  He actually just had blood work done.  So we will plan to do it then if he is seeing Dr. Everette Rank soon so he might be able to draw the blood work for him next month.      Relevant Medications   levothyroxine (SYNTHROID) 88 MCG tablet     Genitourinary   CKD (chronic kidney disease) stage 4, GFR 15-29 ml/min ([place])    Keep nephrology appointment in 6 months.  Last repeat creatinine was 1.7 which is great.  Was up to 2.0 back in the fall.        Questions about the COVID  vaccine. Encouraged them to schedule.    Meds ordered this encounter  Medications  . levothyroxine (SYNTHROID) 88 MCG tablet    Sig: Take 1 tablet (88 mcg total) by mouth daily.    Dispense:  90 tablet    Refill:  1    Follow-up: Return in about 6 months (around 05/26/2020) for Hypertension.    Beatrice Lecher, MD

## 2019-11-27 NOTE — Assessment & Plan Note (Signed)
Keep nephrology appointment in 6 months.  Last repeat creatinine was 1.7 which is great.  Was up to 2.0 back in the fall.

## 2019-11-27 NOTE — Assessment & Plan Note (Signed)
Due to recheck lipid levels.  We will try to get that done when we checked the thyroid.

## 2019-11-27 NOTE — Assessment & Plan Note (Signed)
He is due for thyroid check in March.  He actually just had blood work done.  So we will plan to do it then if he is seeing Dr. Everette Rank soon so he might be able to draw the blood work for him next month.

## 2019-11-27 NOTE — Assessment & Plan Note (Signed)
Well controlled. Continue current regimen. Follow up in  6 mo  

## 2019-11-27 NOTE — Assessment & Plan Note (Signed)
Covering well.  Incisions actually look really good.  I do believe he probably has a seroma underneath the largest incision in that left lower quadrant.  But it should eventually resolve.  If it starts to cause increase in pain or does not seem to be resolving then please follow-up with the surgeon.

## 2019-11-27 NOTE — Patient Instructions (Signed)
Due to check thyroid and cholesterol in April if Dr. Carola Rhine doesn't do bloodwork.

## 2019-11-28 ENCOUNTER — Telehealth: Payer: Self-pay | Admitting: *Deleted

## 2019-11-28 DIAGNOSIS — E039 Hypothyroidism, unspecified: Secondary | ICD-10-CM

## 2019-11-28 NOTE — Telephone Encounter (Signed)
Received a fax from [facility] stating that Levothyroxine 88 mcg by Sandoz will need to be changed due to brand being d/c'd.   Ok to change to Risk analyst.   Pt will need to have TSH rechecked in 6 weeks after starting.  Order placed and faxed.   Spoke w/pt and his wife and explained what was going on. They both verbalized understanding and agreed.Elouise Munroe, [place]

## 2019-12-18 ENCOUNTER — Other Ambulatory Visit: Payer: Self-pay | Admitting: Cardiology

## 2019-12-18 ENCOUNTER — Other Ambulatory Visit: Payer: Self-pay | Admitting: Family Medicine

## 2020-01-01 DIAGNOSIS — G44209 Tension-type headache, unspecified, not intractable: Secondary | ICD-10-CM | POA: Diagnosis not present

## 2020-01-01 DIAGNOSIS — R42 Dizziness and giddiness: Secondary | ICD-10-CM | POA: Diagnosis not present

## 2020-01-04 ENCOUNTER — Other Ambulatory Visit: Payer: Self-pay | Admitting: Cardiology

## 2020-01-16 DIAGNOSIS — E039 Hypothyroidism, unspecified: Secondary | ICD-10-CM | POA: Diagnosis not present

## 2020-01-17 LAB — TSH: TSH: 1.41 mIU/L (ref 0.40–4.50)

## 2020-01-17 NOTE — Telephone Encounter (Signed)
All labs are normal. 

## 2020-02-14 DIAGNOSIS — R1110 Vomiting, unspecified: Secondary | ICD-10-CM | POA: Diagnosis not present

## 2020-02-14 DIAGNOSIS — R42 Dizziness and giddiness: Secondary | ICD-10-CM | POA: Diagnosis not present

## 2020-02-14 DIAGNOSIS — I6782 Cerebral ischemia: Secondary | ICD-10-CM | POA: Diagnosis not present

## 2020-02-14 DIAGNOSIS — R1111 Vomiting without nausea: Secondary | ICD-10-CM | POA: Diagnosis not present

## 2020-02-14 DIAGNOSIS — Z87891 Personal history of nicotine dependence: Secondary | ICD-10-CM | POA: Diagnosis not present

## 2020-02-14 DIAGNOSIS — R531 Weakness: Secondary | ICD-10-CM | POA: Diagnosis not present

## 2020-02-14 DIAGNOSIS — R001 Bradycardia, unspecified: Secondary | ICD-10-CM | POA: Diagnosis not present

## 2020-02-15 DIAGNOSIS — R001 Bradycardia, unspecified: Secondary | ICD-10-CM | POA: Diagnosis not present

## 2020-02-29 DIAGNOSIS — I6350 Cerebral infarction due to unspecified occlusion or stenosis of unspecified cerebral artery: Secondary | ICD-10-CM | POA: Diagnosis not present

## 2020-03-01 ENCOUNTER — Telehealth: Payer: Self-pay | Admitting: Family Medicine

## 2020-03-01 DIAGNOSIS — H409 Unspecified glaucoma: Secondary | ICD-10-CM

## 2020-03-01 NOTE — Telephone Encounter (Signed)
Order placed. Please fax.

## 2020-03-01 NOTE — Telephone Encounter (Signed)
Dr. Francis Dowse for [facility] called and stated Roberto Fields has an appointment on Monday with their office.  He has [employer] insurance and requires a referral for the appointment. Can you please place the referral he is being seen for Glaucoma evaluation.   Thank you  Jenny Reichmann

## 2020-03-01 NOTE — Telephone Encounter (Signed)
Order sent thank you

## 2020-03-04 DIAGNOSIS — H527 Unspecified disorder of refraction: Secondary | ICD-10-CM | POA: Diagnosis not present

## 2020-03-04 DIAGNOSIS — Z961 Presence of intraocular lens: Secondary | ICD-10-CM | POA: Diagnosis not present

## 2020-03-04 DIAGNOSIS — H401133 Primary open-angle glaucoma, bilateral, severe stage: Secondary | ICD-10-CM | POA: Diagnosis not present

## 2020-03-04 DIAGNOSIS — H02833 Dermatochalasis of right eye, unspecified eyelid: Secondary | ICD-10-CM | POA: Diagnosis not present

## 2020-03-04 DIAGNOSIS — H02836 Dermatochalasis of left eye, unspecified eyelid: Secondary | ICD-10-CM | POA: Diagnosis not present

## 2020-03-15 ENCOUNTER — Other Ambulatory Visit: Payer: Self-pay | Admitting: Family Medicine

## 2020-03-18 ENCOUNTER — Other Ambulatory Visit: Payer: Self-pay | Admitting: Family Medicine

## 2020-03-18 DIAGNOSIS — E039 Hypothyroidism, unspecified: Secondary | ICD-10-CM

## 2020-03-19 DIAGNOSIS — R42 Dizziness and giddiness: Secondary | ICD-10-CM | POA: Diagnosis not present

## 2020-03-19 DIAGNOSIS — I6350 Cerebral infarction due to unspecified occlusion or stenosis of unspecified cerebral artery: Secondary | ICD-10-CM | POA: Diagnosis not present

## 2020-04-10 ENCOUNTER — Other Ambulatory Visit: Payer: Self-pay | Admitting: *Deleted

## 2020-04-10 MED ORDER — AMLODIPINE BESYLATE 5 MG PO TABS: 5.0000 mg | ORAL_TABLET | Freq: Every day | ORAL | 1 refills | Status: DC

## 2020-04-22 ENCOUNTER — Other Ambulatory Visit: Payer: Self-pay | Admitting: Cardiology

## 2020-05-07 NOTE — Progress Notes (Signed)
HPI: FUCADand PAF. Previously cared for at [facility]. Catheterization 2013 showed a 99% obtuse marginal and 70% PDA. Patient had PCI of his marginal. Note based on records his disease was predominantly distal and not amenable to revascularization. Carotid Doppler September 2014 showed 1-39% bilateral stenosis. Echocardiogram September 2014 showed normal LV systolic function, Mild left ventricular hypertrophy and mild aortic insufficiency. Patient also with history of atrial fibrillation.Nuclear study September 2017 showed mild inferolateral ischemia and ejection fraction 52%. Study felt to be low risk and we are treating medically.Renal ultrasound 2016 showed no aneurysm. Recent monitor May 02, 2019 showed sinus bradycardia with PACs, PVCs and brief PAT. Heart rate as low as 37. Amiodarone discontinued. Since last seen,patient denies dyspnea, chest pain, palpitations or syncope.  No bleeding.  Current Outpatient Medications  Medication Sig Dispense Refill   Acetaminophen (TYLENOL PO) Take by mouth.     albuterol (PROVENTIL HFA;VENTOLIN HFA) 108 (90 Base) MCG/ACT inhaler Inhale 2 puffs into the lungs every 6 (six) hours as needed for wheezing. 18 g 4   amLODipine (NORVASC) 5 MG tablet Take 1 tablet (5 mg total) by mouth daily. 90 tablet 1   Docusate Calcium (STOOL SOFTENER PO) Take by mouth.     ELIQUIS 5 MG TABS tablet Take 1 tablet by mouth twice daily 180 tablet 0   EUTHYROX 88 MCG tablet Take 1 tablet by mouth once daily 90 tablet 0   Ferrous Sulfate (IRON SUPPLEMENT PO) Take 1 tablet by mouth daily.     gabapentin (NEURONTIN) 300 MG capsule Take 600 mg by mouth 2 (two) times daily.      meclizine (ANTIVERT) 25 MG tablet TAKE 1 TABLET BY MOUTH THREE TIMES DAILY AS NEEDED FOR DIZZINESS 90 tablet 0   niacin (NIASPAN) 500 MG CR tablet Take 500 mg by mouth daily.      nitroGLYCERIN (NITROSTAT) 0.4 MG SL tablet DISSOLVE ONE TABLET UNDER THE TONGUE EVERY 5 MINUTES  AS NEEDED FOR CHEST PAIN 100 tablet prn   omeprazole (PRILOSEC) 40 MG capsule Take 1 capsule by mouth once daily 90 capsule 3   REFRESH CELLUVISC 1 % GEL Apply 1 drop to eye 4 (four) times daily.     telmisartan (MICARDIS) 80 MG tablet Take 1/2 (one-half) tablet by mouth once daily 45 tablet 1   timolol (TIMOPTIC) 0.5 % ophthalmic solution 1 drop 2 (two) times daily.     vitamin B-12 (CYANOCOBALAMIN) 1000 MCG tablet Take 1 tablet by mouth daily.     No current facility-administered medications for this visit.     Past Medical History:  Diagnosis Date   Arthritis    Atrial fibrillation (HCC)    BPH (benign prostatic hyperplasia)    CAD (coronary artery disease)    Cataract    bilateral cataracts removed   COPD (chronic obstructive pulmonary disease) (HCC)    CVA (cerebral infarction)    History of blood clots    History of colon polyps    Hyperlipidemia    Hypertension    Hypothyroid    Myocardial infarction [facility]) 2010   Renal insufficiency    stage 4    Stomach ulcer    Stroke ([place]) 2010   Ulcer 1992   HX peptic ulcer- DX: by endoscopy    Past Surgical History:  Procedure Laterality Date   arm surgery     COLONOSCOPY     Dr Lavina Hamman in [place]. Dr Unk Lightning removed a larger polyp   CORONARY STENT  PLACEMENT  04/2011   [facility], [facility]   ESOPHAGOGASTRODUODENOSCOPY     dr Lavina Hamman in [place] and 1989   TRANSURETHRAL RESECTION OF PROSTATE      Social History   Socioeconomic History   Marital status: Married    Spouse name: Not on file   Number of children: 3   Years of education: Not on file   Highest education level: Not on file  Occupational History   Occupation: Retired  Tobacco Use   Smoking status: Former Smoker   Smokeless tobacco: Never Used  Scientific laboratory technician Use: Never used  Substance and Sexual Activity   Alcohol use: No    Drug use: No   Sexual activity: Not on file  Other Topics Concern   Not on file  Social History Narrative   Not on file   Social Determinants of Health   Financial Resource Strain:    Difficulty of Paying Living Expenses:   Food Insecurity:    Worried About Charity fundraiser in the Last Year:    Arboriculturist in the Last Year:   Transportation Needs:    Film/video editor (Medical):    Lack of Transportation (Non-Medical):   Physical Activity:    Days of Exercise per Week:    Minutes of Exercise per Session:   Stress:    Feeling of Stress :   Social Connections:    Frequency of Communication with Friends and Family:    Frequency of Social Gatherings with Friends and Family:    Attends Religious Services:    Active Member of Clubs or Organizations:    Attends Music therapist:    Marital Status:   Intimate Partner Violence:    Fear of Current or Ex-Partner:    Emotionally Abused:    Physically Abused:    Sexually Abused:     Family History  Problem Relation Age of Onset   Heart disease Father    Heart attack Father    Coronary artery disease Brother    Heart attack Brother 73       pacemaker and defibrillator   Coronary artery disease Brother    Leukemia Mother    Hypertension Other        family history of   Heart disease Sister    Stomach cancer Sister    Breast cancer Sister    Lung cancer Sister    Breast cancer Sister    Lung cancer Sister    Colon cancer Neg Hx    Esophageal cancer Neg Hx    Rectal cancer Neg Hx     ROS: no fevers or chills, productive cough, hemoptysis, dysphasia, odynophagia, melena, hematochezia, dysuria, hematuria, rash, seizure activity, orthopnea, PND, pedal edema, claudication. Remaining systems are negative.  Physical Exam: Well-developed well-nourished in no acute distress.  Skin is warm and dry.  HEENT is normal.  Neck is supple.  Chest is clear to auscultation  with normal expansion.  Cardiovascular exam is regular rate and rhythm.  Abdominal exam nontender or distended. No masses palpated. Extremities show no edema. neuro grossly intact  ECG-sinus bradycardia at a rate of 56, no ST changes.  Personally reviewed  A/P  1 paroxysmal atrial fibrillation-patient remains in sinus rhythm.  Amiodarone was discontinued previously due to bradycardia.  Continue apixaban.   2 history of bradycardia-this  is improved following discontinuation of amiodarone.  We will continue to follow.  3 hypertension-patient's blood pressure is controlled.  Continue present medical regimen.  4 hyperlipidemia-patient is intolerant to statins and Zetia.  He is not willing to try Repatha.  5 coronary artery disease-patient is not on aspirin given need for apixaban.  Intolerant to statins.  6 chronic stage III kidney disease-monitored by nephrology.  Kirk Ruths, MD

## 2020-05-15 ENCOUNTER — Ambulatory Visit: Payer: PPO | Admitting: Cardiology

## 2020-05-15 ENCOUNTER — Encounter: Payer: Self-pay | Admitting: Cardiology

## 2020-05-15 ENCOUNTER — Other Ambulatory Visit: Payer: Self-pay

## 2020-05-15 VITALS — BP 129/60 | HR 59 | Ht 68.0 in | Wt 167.0 lb

## 2020-05-15 DIAGNOSIS — E7800 Pure hypercholesterolemia, unspecified: Secondary | ICD-10-CM

## 2020-05-15 DIAGNOSIS — I480 Paroxysmal atrial fibrillation: Secondary | ICD-10-CM

## 2020-05-15 DIAGNOSIS — I2510 Atherosclerotic heart disease of native coronary artery without angina pectoris: Secondary | ICD-10-CM | POA: Diagnosis not present

## 2020-05-15 DIAGNOSIS — I10 Essential (primary) hypertension: Secondary | ICD-10-CM

## 2020-05-15 NOTE — Patient Instructions (Signed)
Medication Instructions:  NO CHANGE *If you need a refill on your cardiac medications before your next appointment, please call your pharmacy*   Lab Work: If you have labs (blood work) drawn today and your tests are completely normal, you will receive your results only by: Marland Kitchen MyChart Message (if you have MyChart) OR . A paper copy in the mail If you have any lab test that is abnormal or we need to change your treatment, we will call you to review the results   Follow-Up: At [facility], you and your health needs are our priority.  As part of our continuing mission to provide you with exceptional heart care, we have created designated Provider Care Teams.  These Care Teams include your primary Cardiologist (physician) and Advanced Practice Providers (APPs -  Physician Assistants and Nurse Practitioners) who all work together to provide you with the care you need, when you need it.  We recommend signing up for the patient portal called "MyChart".  Sign up information is provided on this After Visit Summary.  MyChart is used to connect with patients for Virtual Visits (Telemedicine).  Patients are able to view lab/test results, encounter notes, upcoming appointments, etc.  Non-urgent messages can be sent to your provider as well.   To learn more about what you can do with MyChart, go to [email or web address].    Your next appointment:   12 month(s)  The format for your next appointment:   In Person  Provider:   Kirk Ruths, MD

## 2020-05-27 ENCOUNTER — Ambulatory Visit (INDEPENDENT_AMBULATORY_CARE_PROVIDER_SITE_OTHER): Payer: PPO | Admitting: Family Medicine

## 2020-05-27 ENCOUNTER — Other Ambulatory Visit: Payer: Self-pay

## 2020-05-27 ENCOUNTER — Encounter: Payer: Self-pay | Admitting: Family Medicine

## 2020-05-27 VITALS — BP 130/41 | HR 50 | Ht 68.0 in | Wt 163.0 lb

## 2020-05-27 DIAGNOSIS — E875 Hyperkalemia: Secondary | ICD-10-CM | POA: Diagnosis not present

## 2020-05-27 DIAGNOSIS — R42 Dizziness and giddiness: Secondary | ICD-10-CM

## 2020-05-27 DIAGNOSIS — E039 Hypothyroidism, unspecified: Secondary | ICD-10-CM

## 2020-05-27 DIAGNOSIS — E611 Iron deficiency: Secondary | ICD-10-CM | POA: Diagnosis not present

## 2020-05-27 DIAGNOSIS — D490 Neoplasm of unspecified behavior of digestive system: Secondary | ICD-10-CM | POA: Diagnosis not present

## 2020-05-27 DIAGNOSIS — I10 Essential (primary) hypertension: Secondary | ICD-10-CM | POA: Diagnosis not present

## 2020-05-27 LAB — COMPLETE METABOLIC PANEL WITH GFR
AG Ratio: 1.9 (calc) (ref 1.0–2.5)
ALT: 6 U/L — ABNORMAL LOW (ref 9–46)
AST: 12 U/L (ref 10–35)
Albumin: 4.2 g/dL (ref 3.6–5.1)
Alkaline phosphatase (APISO): 107 U/L (ref 35–144)
BUN/Creatinine Ratio: 17 (calc) (ref 6–22)
BUN: 31 mg/dL — ABNORMAL HIGH (ref 7–25)
CO2: 26 mmol/L (ref 20–32)
Calcium: 9.3 mg/dL (ref 8.6–10.3)
Chloride: 108 mmol/L (ref 98–110)
Creat: 1.84 mg/dL — ABNORMAL HIGH (ref 0.70–1.18)
GFR, Est African American: 40 mL/min/{1.73_m2} — ABNORMAL LOW (ref >=60)
GFR, Est Non African American: 35 mL/min/{1.73_m2} — ABNORMAL LOW (ref >=60)
Globulin: 2.2 g/dL (calc) (ref 1.9–3.7)
Glucose, Bld: 97 mg/dL (ref 65–99)
Potassium: 5.9 mmol/L — ABNORMAL HIGH (ref 3.5–5.3)
Sodium: 139 mmol/L (ref 135–146)
Total Bilirubin: 0.8 mg/dL (ref 0.2–1.2)
Total Protein: 6.4 g/dL (ref 6.1–8.1)

## 2020-05-27 LAB — IRON,TIBC AND FERRITIN PANEL
%SAT: 46 % (calc) (ref 20–48)
Ferritin: 63 ng/mL (ref 24–380)
Iron: 145 ug/dL (ref 50–180)
TIBC: 315 mcg/dL (calc) (ref 250–425)

## 2020-05-27 LAB — LIPID PANEL W/REFLEX DIRECT LDL
Cholesterol: 187 mg/dL (ref <200)
HDL: 39 mg/dL — ABNORMAL LOW (ref >=40)
LDL Cholesterol (Calc): 123 mg/dL (calc) — ABNORMAL HIGH
Non-HDL Cholesterol (Calc): 148 mg/dL (calc) — ABNORMAL HIGH (ref <130)
Total CHOL/HDL Ratio: 4.8 (calc) (ref <5.0)
Triglycerides: 130 mg/dL (ref <150)

## 2020-05-27 MED ORDER — LEVOTHYROXINE SODIUM 88 MCG PO TABS: 88.0000 ug | ORAL_TABLET | Freq: Every day | ORAL | 1 refills | Status: DC

## 2020-05-27 NOTE — Assessment & Plan Note (Signed)
H in March looked great.  We will go ahead and refill medication for 6 months.

## 2020-05-27 NOTE — Assessment & Plan Note (Signed)
I suspect he is due for follow-up will refer him back to GI he previously saw Dr. Jackquline Denmark.  At a minimum I would think that they would want to see him back in at least a year.

## 2020-05-27 NOTE — Assessment & Plan Note (Addendum)
Follows with neurology, Dr. Everette Rank. Secondary to posterior circulation stroke. Dr. Everette Rank at [facility] in [place]

## 2020-05-27 NOTE — Assessment & Plan Note (Signed)
Well controlled. Continue current regimen. Follow up in  6 mo. Due for labs.  

## 2020-05-27 NOTE — Progress Notes (Signed)
Established Patient Office Visit  Subjective:  Patient ID: Roberto Fields, male    DOB: Jan 10, 1946  Age: 74 y.o. MRN: [id number]  CC:  Chief Complaint  Patient presents with  . Hypertension    HPI Roberto Fields presents for   Hypertension- Pt denies chest pain, SOB, dizziness, or heart palpitations.  Taking meds as directed w/o problems.  Denies medication side effects.    Hypothyroidism - Taking medication regularly in the AM away from food and vitamins, etc. No recent change to skin, hair, or energy levels.  He still gets occasional episodes of dizziness but follows with Dr. Cannon Kettle in fact he had one recently that required an ED visit in April.  He has seen Dr. Cannon Kettle since then he just gets episodic symptoms.  He has a history iron deficiency anemia and is still taking his iron supplement regularly.  He has prior diagnosis of colon cancer and had surgery in November of last year but says he is not sure if he supposed to follow-up with either the GI or the surgeon.  His wife was asking about follow-up.  Past Medical History:  Diagnosis Date  . Arthritis   . Atrial fibrillation ([place])   . BPH (benign prostatic hyperplasia)   . CAD (coronary artery disease)   . Cataract    bilateral cataracts removed  . COPD (chronic obstructive pulmonary disease) ([place])   . CVA (cerebral infarction)   . History of blood clots   . History of colon polyps   . Hyperlipidemia   . Hypertension   . Hypothyroid   . Myocardial infarction ([place]) 2010  . Renal insufficiency    stage 4   . Stomach ulcer   . Stroke ([place]) 2010  . Ulcer 1992   HX peptic ulcer- DX: by endoscopy    Past Surgical History:  Procedure Laterality Date  . arm surgery    . COLONOSCOPY     Dr Lavina Hamman in [place]. Dr Unk Lightning removed a larger polyp  . CORONARY STENT PLACEMENT  04/2011   [facility], [facility]  . ESOPHAGOGASTRODUODENOSCOPY     dr Lavina Hamman in [place]  . EYE SURGERY    . HERNIA MESH REMOVAL     . [place]  . TRANSURETHRAL RESECTION OF PROSTATE      Family History  Problem Relation Age of Onset  . Heart disease Father   . Heart attack Father   . Coronary artery disease Brother   . Heart attack Brother 36       pacemaker and defibrillator  . Coronary artery disease Brother   . Leukemia Mother   . Hypertension Other        family history of  . Heart disease Sister   . Stomach cancer Sister   . Breast cancer Sister   . Lung cancer Sister   . Breast cancer Sister   . Lung cancer Sister   . Colon cancer Neg Hx   . Esophageal cancer Neg Hx   . Rectal cancer Neg Hx     Social History   Socioeconomic History  . Marital status: Married    Spouse name: Not on file  . Number of children: 3  . Years of education: Not on file  . Highest education level: Not on file  Occupational History  . Occupation: Retired  Tobacco Use  . Smoking status: Former Research scientist (life sciences)  . Smokeless tobacco: Never Used  Vaping Use  . Vaping  Use: Never used  Substance and Sexual Activity  . Alcohol use: No  . Drug use: No  . Sexual activity: Not on file  Other Topics Concern  . Not on file  Social History Narrative  . Not on file   Social Determinants of Health   Financial Resource Strain:   . Difficulty of Paying Living Expenses:   Food Insecurity:   . Worried About Charity fundraiser in the Last Year:   . Arboriculturist in the Last Year:   Transportation Needs:   . Film/video editor (Medical):   Marland Kitchen Lack of Transportation (Non-Medical):   Physical Activity:   . Days of Exercise per Week:   . Minutes of Exercise per Session:   Stress:   . Feeling of Stress :   Social Connections:   . Frequency of Communication with Friends and Family:   . Frequency of Social Gatherings with Friends and Family:   . Attends Religious Services:   . Active Member of Clubs or Organizations:   . Attends Archivist Meetings:   Marland Kitchen Marital Status:   Intimate  Partner Violence:   . Fear of Current or Ex-Partner:   . Emotionally Abused:   Marland Kitchen Physically Abused:   . Sexually Abused:     Outpatient Medications Prior to Visit  Medication Sig Dispense Refill  . Acetaminophen (TYLENOL PO) Take by mouth.    Marland Kitchen albuterol (PROVENTIL HFA;VENTOLIN HFA) 108 (90 Base) MCG/ACT inhaler Inhale 2 puffs into the lungs every 6 (six) hours as needed for wheezing. 18 g 4  . amLODipine (NORVASC) 5 MG tablet Take 1 tablet (5 mg total) by mouth daily. 90 tablet 1  . Docusate Calcium (STOOL SOFTENER PO) Take by mouth.    Arne Cleveland 5 MG TABS tablet Take 1 tablet by mouth twice daily 180 tablet 0  . Ferrous Sulfate (IRON SUPPLEMENT PO) Take 1 tablet by mouth daily.    Marland Kitchen gabapentin (NEURONTIN) 300 MG capsule Take 600 mg by mouth 2 (two) times daily.     . meclizine (ANTIVERT) 25 MG tablet TAKE 1 TABLET BY MOUTH THREE TIMES DAILY AS NEEDED FOR DIZZINESS 90 tablet 0  . niacin (NIASPAN) 500 MG CR tablet Take 500 mg by mouth daily.     . nitroGLYCERIN (NITROSTAT) 0.4 MG SL tablet DISSOLVE ONE TABLET UNDER THE TONGUE EVERY 5 MINUTES AS NEEDED FOR CHEST PAIN 100 tablet prn  . omeprazole (PRILOSEC) 40 MG capsule Take 1 capsule by mouth once daily 90 capsule 3  . REFRESH CELLUVISC 1 % GEL Apply 1 drop to eye 4 (four) times daily.    Marland Kitchen telmisartan (MICARDIS) 80 MG tablet Take 1/2 (one-half) tablet by mouth once daily 45 tablet 1  . timolol (TIMOPTIC) 0.5 % ophthalmic solution 1 drop 2 (two) times daily.    . vitamin B-12 (CYANOCOBALAMIN) 1000 MCG tablet Take 1 tablet by mouth daily.    Arna Medici 88 MCG tablet Take 1 tablet by mouth once daily 90 tablet 0   No facility-administered medications prior to visit.    Allergies  Allergen Reactions  . Atorvastatin Other (See Comments)    REACTION: Muscle weakness  . Nsaids Other (See Comments)    Avoid with current kidney function.    . Colesevelam Other (See Comments)    REACTION: joint pain  . Pravastatin Other (See Comments)     Muscle aches  . Simvastatin Other (See Comments)    REACTION: Myalgias  ROS Review of Systems    Objective:    Physical Exam Constitutional:      Appearance: He is well-developed.  HENT:     Head: Normocephalic and atraumatic.  Cardiovascular:     Rate and Rhythm: Normal rate and regular rhythm.     Heart sounds: Normal heart sounds.  Pulmonary:     Effort: Pulmonary effort is normal.     Breath sounds: Normal breath sounds.  Skin:    General: Skin is warm and dry.  Neurological:     Mental Status: He is alert and oriented to person, place, and time.  Psychiatric:        Behavior: Behavior normal.     BP (!) 130/41   Pulse (!) 50   Ht 5\' 8"  (1.727 m)   Wt 163 lb (73.9 kg)   SpO2 100%   BMI 24.78 kg/m  Wt Readings from Last 3 Encounters:  05/27/20 163 lb (73.9 kg)  05/15/20 167 lb (75.8 kg)  11/27/19 161 lb (73 kg)     There are no preventive care reminders to display for this patient.  There are no preventive care reminders to display for this patient.  Lab Results  Component Value Date   TSH 1.41 01/16/2020   Lab Results  Component Value Date   WBC 4.4 09/06/2019   HGB 9.1 (L) 09/06/2019   HCT 26.8 (L) 09/06/2019   MCV 104.9 (H) 09/06/2019   PLT 141.0 (L) 09/06/2019   Lab Results  Component Value Date   NA 142 09/06/2019   K 4.7 09/06/2019   CO2 27 09/06/2019   GLUCOSE 124 (H) 09/06/2019   BUN 25 (H) 09/06/2019   CREATININE 2.03 (H) 09/06/2019   BILITOT 0.4 09/06/2019   ALKPHOS 104 09/06/2019   AST 10 09/06/2019   ALT 6 09/06/2019   PROT 6.3 09/06/2019   ALBUMIN 4.0 09/06/2019   CALCIUM 9.1 09/06/2019   ANIONGAP 6 07/18/2019   GFR 32.14 (L) 09/06/2019   Lab Results  Component Value Date   CHOL 185 08/05/2018   Lab Results  Component Value Date   HDL 37 (L) 08/05/2018   Lab Results  Component Value Date   LDLCALC 126 (H) 08/05/2018   Lab Results  Component Value Date   TRIG 113 08/05/2018   Lab Results  Component Value  Date   CHOLHDL 5.0 (H) 08/05/2018   Lab Results  Component Value Date   HGBA1C 5.5 08/26/2015      Assessment & Plan:   Problem List Items Addressed This Visit      Cardiovascular and Mediastinum   HYPERTENSION, BENIGN ESSENTIAL - Primary    Well controlled. Continue current regimen. Follow up in  6 mo. Due for labs      Relevant Orders   Lipid Panel w/reflex Direct LDL   COMPLETE METABOLIC PANEL WITH GFR     Digestive   Colonic neoplasm    I suspect he is due for follow-up will refer him back to GI he previously saw Dr. Jackquline Denmark.  At a minimum I would think that they would want to see him back in at least a year.      Relevant Orders   Ambulatory referral to Gastroenterology     Endocrine   Hypothyroidism    H in March looked great.  We will go ahead and refill medication for 6 months.      Relevant Medications   levothyroxine (EUTHYROX) 88 MCG tablet  Other   Dizzinesses    Follows with neurology, Dr. Everette Rank. Secondary to posterior circulation stroke. Dr. Everette Rank at [facility] in [place]        Other Visit Diagnoses    Iron deficiency       Relevant Orders   Fe+TIBC+Fer     Iron deficiency-still on iron supplement.  Plan to recheck iron levels today.   Meds ordered this encounter  Medications  . levothyroxine (EUTHYROX) 88 MCG tablet    Sig: Take 1 tablet (88 mcg total) by mouth daily.    Dispense:  90 tablet    Refill:  1    Follow-up: Return in about 6 months (around 11/27/2020) for Hypertension.    Beatrice Lecher, MD

## 2020-05-29 MED ORDER — SODIUM POLYSTYRENE SULFONATE PO POWD: Freq: Every day | ORAL | 0 refills | Status: DC

## 2020-05-29 NOTE — Addendum Note (Signed)
Addended by: Beatrice Lecher D on: 05/29/2020 02:44 PM   Modules accepted: Orders

## 2020-05-31 ENCOUNTER — Other Ambulatory Visit: Payer: Self-pay | Admitting: *Deleted

## 2020-05-31 DIAGNOSIS — E875 Hyperkalemia: Secondary | ICD-10-CM

## 2020-06-01 ENCOUNTER — Other Ambulatory Visit: Payer: Self-pay | Admitting: Family Medicine

## 2020-06-18 ENCOUNTER — Ambulatory Visit (HOSPITAL_BASED_OUTPATIENT_CLINIC_OR_DEPARTMENT_OTHER)
Admission: RE | Admit: 2020-06-18 | Discharge: 2020-06-18 | Disposition: A | Discharge status: Home / Self Care | Payer: PPO | Source: Ambulatory Visit | Attending: Osteopathic Medicine | Admitting: Osteopathic Medicine

## 2020-06-18 ENCOUNTER — Other Ambulatory Visit: Payer: Self-pay

## 2020-06-18 ENCOUNTER — Ambulatory Visit (INDEPENDENT_AMBULATORY_CARE_PROVIDER_SITE_OTHER): Payer: PPO | Admitting: Osteopathic Medicine

## 2020-06-18 ENCOUNTER — Encounter: Payer: Self-pay | Admitting: Osteopathic Medicine

## 2020-06-18 VITALS — BP 118/58 | HR 62 | Temp 97.0°F | Wt 168.0 lb

## 2020-06-18 DIAGNOSIS — Z7901 Long term (current) use of anticoagulants: Secondary | ICD-10-CM

## 2020-06-18 DIAGNOSIS — N184 Chronic kidney disease, stage 4 (severe): Secondary | ICD-10-CM

## 2020-06-18 DIAGNOSIS — E039 Hypothyroidism, unspecified: Secondary | ICD-10-CM | POA: Insufficient documentation

## 2020-06-18 DIAGNOSIS — I480 Paroxysmal atrial fibrillation: Secondary | ICD-10-CM | POA: Diagnosis not present

## 2020-06-18 DIAGNOSIS — J449 Chronic obstructive pulmonary disease, unspecified: Secondary | ICD-10-CM | POA: Insufficient documentation

## 2020-06-18 DIAGNOSIS — E875 Hyperkalemia: Secondary | ICD-10-CM | POA: Insufficient documentation

## 2020-06-18 DIAGNOSIS — M79604 Pain in right leg: Secondary | ICD-10-CM | POA: Diagnosis not present

## 2020-06-18 DIAGNOSIS — R600 Localized edema: Secondary | ICD-10-CM

## 2020-06-18 DIAGNOSIS — M7989 Other specified soft tissue disorders: Secondary | ICD-10-CM | POA: Diagnosis not present

## 2020-06-18 NOTE — Patient Instructions (Addendum)
Will get labs  EKG today looks good Ultrasound today to evaluate for DVT - please go to [facility], [place], [place] [id number] Will schedule another test called ABI to evaluate circulation - yo uwill get a call about this  I suspect a blood clot as a potential cause of the swelling. This is rare in people on blood thinners, but we need to look for it. Depending on results, we may need to obtain some other imaging to find out what's going on. If swelling becomes severe, or of other concerning symptoms develop especially chest pain and/or trouble breathing, please seek emergency medical attention in ER / call 911.

## 2020-06-18 NOTE — Progress Notes (Signed)
Roberto Fields is a 74 y.o. male who presents to  [place] at [facility]  today, 06/18/20, seeking care for the following:  . LE concern - RLE edema x2 weeks, associated w/ patchy brownish skin change on anterior and lateral RLE. Noted after working in his yard a couple weeks ago, no injury, no insect/arthropod bite. No Hx CHF, see below for other cardiac history. Reports SOB "I'm holding my breath when I walk because the leg hurts." Reports some chest pressure "no more than usual" has not had to take nitro.  . Exam: +1-2 pitting edema RLE up to knee, hemosiderin deposition in patch on anterior distal LE and lateral more proximal below the knee, non blanching, some purpura present. CV: RRR, faint heart sounds, +7/1 systolic murmue, RRR. Lungs:very faint rales on initial auscultations which clear w/ few deep breaths c/w atelectasis  . Notes reviewed o Saw PCP 05/27/2020 - looks like routine f/u HTN, hypothyroid, CKD3.  o Saw cardiology 05/15/20 - PAfib, continuing apixiban; bradycardia better after d/c amiodarone; HTN at goal; HLD w/ statin intolerance declined repatha; CAD.       ASSESSMENT & PLAN with other pertinent findings:  The primary encounter diagnosis was Unilateral edema of lower extremity. Diagnoses of Serum potassium elevated, Hypothyroidism, unspecified type, CKD (chronic kidney disease) stage 4, GFR 15-29 ml/min (HCC), Paroxysmal atrial fibrillation (HCC), Anticoagulated, and Chronic obstructive pulmonary disease, unspecified COPD type ([place]) were also pertinent to this visit.   Multiple cardiovascular risk factors and complicated history.   Unilateral edema concerning for DVT, will evaluate. Pt reports he's been consistent w/ anticoagulants.   SOB/CP seems at baseline, EKG today no concerns, mild bradycardia w/ sinus rhythm 55 bpm,   DVT negative - await labs, would strongly consider echocardiogram or imaging for other (posisble  malignancy) causing RLE vascular compression       Patient Instructions  Will get labs  EKG today looks good Ultrasound today to evaluate for DVT - please go to [place], [place], [place] [phone] Will schedule another test called ABI to evaluate circulation - yo uwill get a call about this  I suspect a blood clot as a potential cause of the swelling. This is rare in people on blood thinners, but we need to look for it. Depending on results, we may need to obtain some other imaging to find out what's going on. If swelling becomes severe, or of other concerning symptoms develop especially chest pain and/or trouble breathing, please seek emergency medical attention in ER / call 911.       Orders Placed This Encounter  Procedures  . US Venous Img Lower Unilateral Right (DVT)  . CBC  . COMPLETE METABOLIC PANEL WITH GFR  . TSH  . B Nat Peptide  . Urinalysis, Routine w reflex microscopic  . VAS [place] ABI WITH/WO TBI    No orders of the defined types were placed in this encounter.      Follow-up instructions: Return for RECHECK PENDING RESULTS / IF WORSE OR CHANGE.                                         BP (!) 118/58   Pulse 62   Temp (!) 97 F (36.1 C)   Wt 168 lb (76.2 kg)   SpO2 97%   BMI 25.54  kg/m   Current Meds  Medication Sig  . Acetaminophen (TYLENOL PO) Take by mouth.  Marland Kitchen albuterol (PROVENTIL HFA;VENTOLIN HFA) 108 (90 Base) MCG/ACT inhaler Inhale 2 puffs into the lungs every 6 (six) hours as needed for wheezing.  Marland Kitchen amLODipine (NORVASC) 5 MG tablet Take 1 tablet (5 mg total) by mouth daily.  Mariane Baumgarten Calcium (STOOL SOFTENER PO) Take by mouth.  Arne [place] 5 MG TABS tablet Take 1 tablet by mouth twice daily  . Ferrous Sulfate (IRON SUPPLEMENT PO) Take 1 tablet by mouth daily.  Marland Kitchen gabapentin (NEURONTIN) 300 MG capsule Take 600 mg by mouth 2 (two) times daily.   Marland Kitchen levothyroxine (EUTHYROX) 88 MCG  tablet Take 1 tablet (88 mcg total) by mouth daily.  . meclizine (ANTIVERT) 25 MG tablet TAKE 1 TABLET BY MOUTH THREE TIMES DAILY AS NEEDED FOR DIZZINESS  . niacin (NIASPAN) 500 MG CR tablet Take 500 mg by mouth daily.   . nitroGLYCERIN (NITROSTAT) 0.4 MG SL tablet DISSOLVE ONE TABLET UNDER THE TONGUE EVERY 5 MINUTES AS NEEDED FOR CHEST PAIN  . omeprazole (PRILOSEC) 40 MG capsule Take 1 capsule by mouth once daily  . REFRESH CELLUVISC 1 % GEL Apply 1 drop to eye 4 (four) times daily.  . sodium polystyrene (KAYEXALATE) powder Take by mouth daily. 15 g PO QD x 1 week.  . telmisartan (MICARDIS) 80 MG tablet Take 1/2 (one-half) tablet by mouth once daily  . timolol (TIMOPTIC) 0.5 % ophthalmic solution 1 drop 2 (two) times daily.  . vitamin B-12 (CYANOCOBALAMIN) 1000 MCG tablet Take 1 tablet by mouth daily.    Results for orders placed or performed in visit on 06/18/20 (from the past 72 hour(s))  CBC     Status: Abnormal   Collection Time: 06/18/20  9:52 AM  Result Value Ref Range   WBC 5.9 3.8 - 10.8 Thousand/uL   RBC 2.28 (L) 4.20 - 5.80 Million/uL   Hemoglobin 7.8 (L) 13.2 - 17.1 g/dL   HCT 23.6 (L) 38 - 50 %   MCV 103.5 (H) 80.0 - 100.0 fL   MCH 34.2 (H) 27.0 - 33.0 pg   MCHC 33.1 32.0 - 36.0 g/dL   RDW 12.6 11.0 - 15.0 %   Platelets 214 140 - 400 Thousand/uL   MPV 11.7 7.5 - 12.5 fL    US Venous Img Lower Unilateral Right (DVT)  Result Date: 06/18/2020 CLINICAL DATA:  Right leg pain and swelling, initial encounter EXAM: RIGHT LOWER EXTREMITY VENOUS DOPPLER ULTRASOUND TECHNIQUE: Gray-scale sonography with graded compression, as well as color Doppler and duplex ultrasound were performed to evaluate the lower extremity deep venous systems from the level of the common femoral vein and including the common femoral, femoral, profunda femoral, popliteal and calf veins including the posterior tibial, peroneal and gastrocnemius veins when visible. The superficial great saphenous vein was also  interrogated. Spectral Doppler was utilized to evaluate flow at rest and with distal augmentation maneuvers in the common femoral, femoral and popliteal veins. COMPARISON:  None. FINDINGS: Contralateral Common Femoral Vein: Respiratory phasicity is normal and symmetric with the symptomatic side. No evidence of thrombus. Normal compressibility. Common Femoral Vein: No evidence of thrombus. Normal compressibility, respiratory phasicity and response to augmentation. Saphenofemoral Junction: No evidence of thrombus. Normal compressibility and flow on color Doppler imaging. Profunda Femoral Vein: No evidence of thrombus. Normal compressibility and flow on color Doppler imaging. Femoral Vein: No evidence of thrombus. Normal compressibility, respiratory phasicity and response to augmentation. Popliteal Vein: No evidence  of thrombus. Normal compressibility, respiratory phasicity and response to augmentation. Calf Veins: No evidence of thrombus. Normal compressibility and flow on color Doppler imaging. Superficial Great Saphenous Vein: No evidence of thrombus. Normal compressibility. Venous Reflux:  None. Other Findings:  Lower extremity edema is noted. IMPRESSION: No evidence of deep venous thrombosis. Electronically Signed   By: Inez Catalina M.D.   On: 06/18/2020 11:07       All questions at time of visit were answered - patient instructed to contact office with any additional concerns or updates.  ER/RTC precautions were reviewed with the patient as applicable.   Please note: voice recognition software was used to produce this document, and typos may escape review. Please contact Dr. Sheppard Coil for any needed clarifications.   Total encounter time: 40 minutes.

## 2020-06-19 LAB — URINALYSIS, ROUTINE W REFLEX MICROSCOPIC
Bacteria, UA: NONE SEEN /HPF
Bilirubin Urine: NEGATIVE
Glucose, UA: NEGATIVE
Hgb urine dipstick: NEGATIVE
Hyaline Cast: NONE SEEN /LPF
Ketones, ur: NEGATIVE
Nitrite: NEGATIVE
Specific Gravity, Urine: 1.017 (ref 1.001–1.03)
Squamous Epithelial / HPF: NONE SEEN /HPF (ref <=5)
pH: 5.5 (ref 5.0–8.0)

## 2020-06-19 LAB — CBC
HCT: 23.6 % — ABNORMAL LOW (ref 38.5–50.0)
Hemoglobin: 7.8 g/dL — ABNORMAL LOW (ref 13.2–17.1)
MCH: 34.2 pg — ABNORMAL HIGH (ref 27.0–33.0)
MCHC: 33.1 g/dL (ref 32.0–36.0)
MCV: 103.5 fL — ABNORMAL HIGH (ref 80.0–100.0)
MPV: 11.7 fL (ref 7.5–12.5)
Platelets: 214 10*3/uL (ref 140–400)
RBC: 2.28 10*6/uL — ABNORMAL LOW (ref 4.20–5.80)
RDW: 12.6 % (ref 11.0–15.0)
WBC: 5.9 10*3/uL (ref 3.8–10.8)

## 2020-06-19 LAB — COMPLETE METABOLIC PANEL WITH GFR
AG Ratio: 1.6 (calc) (ref 1.0–2.5)
ALT: 9 U/L (ref 9–46)
AST: 10 U/L (ref 10–35)
Albumin: 3.9 g/dL (ref 3.6–5.1)
Alkaline phosphatase (APISO): 93 U/L (ref 35–144)
BUN/Creatinine Ratio: 14 (calc) (ref 6–22)
BUN: 28 mg/dL — ABNORMAL HIGH (ref 7–25)
CO2: 22 mmol/L (ref 20–32)
Calcium: 9 mg/dL (ref 8.6–10.3)
Chloride: 108 mmol/L (ref 98–110)
Creat: 2 mg/dL — ABNORMAL HIGH (ref 0.70–1.18)
GFR, Est African American: 36 mL/min/{1.73_m2} — ABNORMAL LOW (ref >=60)
GFR, Est Non African American: 31 mL/min/{1.73_m2} — ABNORMAL LOW (ref >=60)
Globulin: 2.4 g/dL (calc) (ref 1.9–3.7)
Glucose, Bld: 102 mg/dL — ABNORMAL HIGH (ref 65–99)
Potassium: 4.6 mmol/L (ref 3.5–5.3)
Sodium: 141 mmol/L (ref 135–146)
Total Bilirubin: 0.6 mg/dL (ref 0.2–1.2)
Total Protein: 6.3 g/dL (ref 6.1–8.1)

## 2020-06-19 LAB — TSH: TSH: 2.05 mIU/L (ref 0.40–4.50)

## 2020-06-19 LAB — BRAIN NATRIURETIC PEPTIDE: Brain Natriuretic Peptide: 260 pg/mL — ABNORMAL HIGH (ref <100)

## 2020-06-19 NOTE — Addendum Note (Signed)
Addended by: Maryla Morrow on: 06/19/2020 02:14 PM   Modules accepted: Orders

## 2020-06-20 ENCOUNTER — Ambulatory Visit (INDEPENDENT_AMBULATORY_CARE_PROVIDER_SITE_OTHER): Payer: PPO

## 2020-06-20 ENCOUNTER — Other Ambulatory Visit: Payer: Self-pay

## 2020-06-20 DIAGNOSIS — K8020 Calculus of gallbladder without cholecystitis without obstruction: Secondary | ICD-10-CM | POA: Diagnosis not present

## 2020-06-20 DIAGNOSIS — R600 Localized edema: Secondary | ICD-10-CM

## 2020-06-21 NOTE — Addendum Note (Signed)
Addended by: Maryla Morrow on: 06/21/2020 12:14 PM   Modules accepted: Orders

## 2020-06-25 ENCOUNTER — Telehealth: Payer: Self-pay | Admitting: Osteopathic Medicine

## 2020-06-25 NOTE — Telephone Encounter (Signed)
I think vein and vascular referral probably more important, they can decide if ABI needs to be done. He has unusual unilateral edema I can't find another cause for

## 2020-06-25 NOTE — Telephone Encounter (Signed)
Dr. Ericka Pontiff with [facility] called she stated she seen in your note where you wanted Vascular ABI done on patient which was scheduled but you also sent to [facility] so she wanted to make sure you also wanted them evaluated as well please advise. - CF

## 2020-06-27 ENCOUNTER — Telehealth: Payer: Self-pay

## 2020-06-27 ENCOUNTER — Other Ambulatory Visit: Payer: Self-pay

## 2020-06-27 ENCOUNTER — Encounter: Payer: Self-pay | Admitting: Hematology & Oncology

## 2020-06-27 ENCOUNTER — Inpatient Hospital Stay: Payer: PPO

## 2020-06-27 ENCOUNTER — Other Ambulatory Visit: Payer: Self-pay | Admitting: *Deleted

## 2020-06-27 ENCOUNTER — Telehealth: Payer: Self-pay | Admitting: Hematology & Oncology

## 2020-06-27 ENCOUNTER — Inpatient Hospital Stay: Payer: PPO | Attending: Hematology & Oncology | Admitting: Hematology & Oncology

## 2020-06-27 VITALS — BP 142/52 | HR 67 | Temp 97.8°F | Resp 18 | Wt 166.0 lb

## 2020-06-27 DIAGNOSIS — Z79899 Other long term (current) drug therapy: Secondary | ICD-10-CM | POA: Insufficient documentation

## 2020-06-27 DIAGNOSIS — D539 Nutritional anemia, unspecified: Secondary | ICD-10-CM

## 2020-06-27 DIAGNOSIS — D490 Neoplasm of unspecified behavior of digestive system: Secondary | ICD-10-CM | POA: Diagnosis not present

## 2020-06-27 DIAGNOSIS — C187 Malignant neoplasm of sigmoid colon: Secondary | ICD-10-CM

## 2020-06-27 DIAGNOSIS — N289 Disorder of kidney and ureter, unspecified: Secondary | ICD-10-CM | POA: Diagnosis not present

## 2020-06-27 DIAGNOSIS — D696 Thrombocytopenia, unspecified: Secondary | ICD-10-CM

## 2020-06-27 DIAGNOSIS — D631 Anemia in chronic kidney disease: Secondary | ICD-10-CM | POA: Insufficient documentation

## 2020-06-27 DIAGNOSIS — D500 Iron deficiency anemia secondary to blood loss (chronic): Secondary | ICD-10-CM

## 2020-06-27 DIAGNOSIS — I480 Paroxysmal atrial fibrillation: Secondary | ICD-10-CM | POA: Insufficient documentation

## 2020-06-27 DIAGNOSIS — Z7901 Long term (current) use of anticoagulants: Secondary | ICD-10-CM | POA: Insufficient documentation

## 2020-06-27 DIAGNOSIS — N184 Chronic kidney disease, stage 4 (severe): Secondary | ICD-10-CM

## 2020-06-27 DIAGNOSIS — Z85038 Personal history of other malignant neoplasm of large intestine: Secondary | ICD-10-CM | POA: Diagnosis not present

## 2020-06-27 HISTORY — DX: Anemia in chronic kidney disease: D63.1

## 2020-06-27 HISTORY — DX: Iron deficiency anemia secondary to blood loss (chronic): D50.0

## 2020-06-27 HISTORY — DX: Malignant neoplasm of sigmoid colon: C18.7

## 2020-06-27 LAB — CMP (CANCER CENTER ONLY)
ALT: 8 U/L (ref 0–44)
AST: 9 U/L — ABNORMAL LOW (ref 15–41)
Albumin: 4 g/dL (ref 3.5–5.0)
Alkaline Phosphatase: 85 U/L (ref 38–126)
Anion gap: 8 (ref 5–15)
BUN: 21 mg/dL (ref 8–23)
CO2: 26 mmol/L (ref 22–32)
Calcium: 9.6 mg/dL (ref 8.9–10.3)
Chloride: 107 mmol/L (ref 98–111)
Creatinine: 1.82 mg/dL — ABNORMAL HIGH (ref 0.61–1.24)
GFR, Est AFR Am: 41 mL/min — ABNORMAL LOW (ref >60)
GFR, Estimated: 35 mL/min — ABNORMAL LOW (ref >60)
Glucose, Bld: 106 mg/dL — ABNORMAL HIGH (ref 70–99)
Potassium: 4.5 mmol/L (ref 3.5–5.1)
Sodium: 141 mmol/L (ref 135–145)
Total Bilirubin: 0.4 mg/dL (ref 0.3–1.2)
Total Protein: 6.7 g/dL (ref 6.5–8.1)

## 2020-06-27 LAB — SAVE SMEAR(SSMR), FOR PROVIDER SLIDE REVIEW

## 2020-06-27 LAB — CBC WITH DIFFERENTIAL (CANCER CENTER ONLY)
Abs Immature Granulocytes: 0.02 10*3/uL (ref 0.00–0.07)
Basophils Absolute: 0 10*3/uL (ref 0.0–0.1)
Basophils Relative: 0 %
Eosinophils Absolute: 0.1 10*3/uL (ref 0.0–0.5)
Eosinophils Relative: 2 %
HCT: 23.6 % — ABNORMAL LOW (ref 39.0–52.0)
Hemoglobin: 7.6 g/dL — ABNORMAL LOW (ref 13.0–17.0)
Immature Granulocytes: 0 %
Lymphocytes Relative: 24 %
Lymphs Abs: 1.1 10*3/uL (ref 0.7–4.0)
MCH: 34.4 pg — ABNORMAL HIGH (ref 26.0–34.0)
MCHC: 32.2 g/dL (ref 30.0–36.0)
MCV: 106.8 fL — ABNORMAL HIGH (ref 80.0–100.0)
Monocytes Absolute: 0.4 10*3/uL (ref 0.1–1.0)
Monocytes Relative: 9 %
Neutro Abs: 3.1 10*3/uL (ref 1.7–7.7)
Neutrophils Relative %: 65 %
Platelet Count: 332 10*3/uL (ref 150–400)
RBC: 2.21 MIL/uL — ABNORMAL LOW (ref 4.22–5.81)
RDW: 13.2 % (ref 11.5–15.5)
WBC Count: 4.7 10*3/uL (ref 4.0–10.5)
nRBC: 0 % (ref 0.0–0.2)

## 2020-06-27 LAB — SAMPLE TO BLOOD BANK

## 2020-06-27 LAB — ABO/RH: ABO/RH(D): O POS

## 2020-06-27 LAB — PREPARE RBC (CROSSMATCH)

## 2020-06-27 NOTE — Telephone Encounter (Signed)
Appointments scheduled calendar printed per 8/19 los 

## 2020-06-27 NOTE — Addendum Note (Signed)
Addended by: Narda Rutherford on: 06/27/2020 10:02 AM   Modules accepted: Orders

## 2020-06-27 NOTE — Telephone Encounter (Signed)
Dr Marin Olp came by our office today asking if we can get patient in to see Dr Lyndel Safe within the next week for anemia. Left a message on patients voice mail to call our office to see if he was available on 06/16/20. This visit can be done in person or virtual. Waiting for patient to call back.

## 2020-06-27 NOTE — Progress Notes (Signed)
Hematology and Oncology Follow Up Visit  Roberto Fields [id number] [birth year] 74 y.o. 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  He was followed by Dr. Maylon Peppers for multifactorial anemia.  He was last seen in the office in September 2020.  The big news is that he was diagnosed with a early stage colon cancer.  This was of the sigmoid colon.  He underwent resection by Dr. Amalia Hailey at [facility] in November 2020.  Dr. Amalia Hailey did a fantastic job.    The pathology report ( [id number]) showed a low-grade colonic adenocarcinoma.  The tumor measured 1.8 x 1.7 x 1 cm.  All margins were negative.  There was only invasion into the submucosa.  28 lymph nodes were all negative.  As such, he has a stage I (T1N0M0) adenocarcinoma.  He comes in today for his follow-up.  His hemoglobin is 7.6.  He really is not that symptomatic.  However, I did do a rectal exam on him.  His stool is brown but heme positive.  He is on anticoagulation.  He has seen Dr. Lyndel Safe of gastroenterology.  We will have to get Roberto Fields back to Dr. Lyndel Safe.  He does have a low erythropoietin level.  This was checked last year and his erythropoietin level was only 17.  Looks like he has had a chronic anemia.  I think that he really will benefit from ESA.  He has had no rashes.  There is been no fever.  He has had no cough or shortness of breath.  He has had some leg swelling.  The right leg is more swollen than the left leg.  This is been checked before.  He had a Doppler done on 06/18/2020.  The  Doppler was negative for venous thromboembolic disease.  Currently, I would have to say his performance status is ECOG 1.  Medications:  Current Outpatient Medications:    Acetaminophen (TYLENOL PO), Take by mouth., Disp: , Rfl:    albuterol (PROVENTIL HFA;VENTOLIN HFA) 108 (90 Base) MCG/ACT inhaler, Inhale 2 puffs into the lungs every 6 (six) hours as needed for wheezing., Disp: 18 g, Rfl: 4   amLODipine (NORVASC) 5 MG tablet, Take 1 tablet (5 mg total) by mouth daily., Disp: 90 tablet, Rfl: 1   Docusate Calcium (STOOL SOFTENER PO), Take by mouth., Disp: , Rfl:    ELIQUIS 5 MG TABS tablet, Take 1 tablet by mouth twice daily, Disp: 180 tablet, Rfl: 0   Ferrous Sulfate (IRON SUPPLEMENT PO), Take 1 tablet by mouth daily., Disp: , Rfl:    gabapentin (NEURONTIN) 300 MG capsule, Take 600 mg by mouth 2 (two) times daily. , Disp: , Rfl:    levothyroxine (EUTHYROX) 88 MCG tablet, Take 1 tablet (88 mcg total) by mouth daily., Disp: 90 tablet, Rfl: 1   meclizine (ANTIVERT) 25 MG tablet, TAKE 1 TABLET BY MOUTH THREE TIMES DAILY AS NEEDED FOR DIZZINESS, Disp: 90 tablet, Rfl: 0   niacin (NIASPAN) 500 MG CR tablet, Take 500 mg by mouth daily. , Disp: , Rfl:  nitroGLYCERIN (NITROSTAT) 0.4 MG SL tablet, DISSOLVE ONE TABLET UNDER THE TONGUE EVERY 5 MINUTES AS NEEDED FOR CHEST PAIN, Disp: 100 tablet, Rfl: prn   omeprazole (PRILOSEC) 40 MG capsule, Take 1 capsule by mouth once daily, Disp: 90 capsule, Rfl: 3   REFRESH CELLUVISC 1 % GEL, Apply 1 drop to eye 4 (four) times daily., Disp: , Rfl:    sodium polystyrene (KAYEXALATE) powder, Take by mouth daily. 15 g PO QD x 1 week., Disp: 454 g, Rfl: 0   telmisartan (MICARDIS) 80 MG tablet, Take 1/2 (one-half) tablet by mouth once daily, Disp: 45 tablet, Rfl: 1   timolol (TIMOPTIC) 0.5 % ophthalmic solution, 1 drop 2 (two) times daily., Disp: , Rfl:    vitamin B-12 (CYANOCOBALAMIN) 1000 MCG tablet, Take 1 tablet by mouth daily., Disp: , Rfl:    Allergies:  Allergies  Allergen Reactions   Atorvastatin Other (See Comments)    REACTION: Muscle weakness   Nsaids Other (See Comments)    Avoid with current kidney function.     Colesevelam Other (See Comments)    REACTION: joint pain   Pravastatin Other (See Comments)    Muscle aches   Simvastatin Other (See Comments)    REACTION: Myalgias    Past Medical History, Surgical history, Social history, and Family History were reviewed and updated.  Review of Systems: Review of Systems  Constitutional: Positive for fatigue.  HENT:  Negative.   Eyes: Negative.   Respiratory: Negative.   Cardiovascular: Positive for palpitations.  Gastrointestinal: Positive for blood in stool.  Endocrine: Negative.   Genitourinary: Negative.    Musculoskeletal: Negative.   Skin: Negative.   Neurological: Negative.   Hematological: Negative.   Psychiatric/Behavioral: Negative.     Physical Exam:  weight is 166 lb (75.3 kg). His oral temperature is 97.8 F (36.6 C). His blood pressure is 142/52 (abnormal) and his pulse is 67. His respiration is 18 and oxygen saturation is 97%.   Wt Readings from Last 3 Encounters:  06/27/20 166 lb (75.3 kg)  06/18/20 168 lb (76.2 kg)  05/27/20 163 lb (73.9 kg)    Physical Exam Vitals reviewed.  HENT:     Head: Normocephalic and atraumatic.  Eyes:     Pupils: Pupils are equal, round, and reactive to light.  Cardiovascular:     Rate and Rhythm: Normal rate and regular rhythm.     Heart sounds: Normal heart sounds.  Pulmonary:     Effort: Pulmonary effort is normal.     Breath sounds: Normal breath sounds.  Abdominal:     General: Bowel sounds are normal.     Palpations: Abdomen is soft.  Musculoskeletal:        General: No tenderness or deformity. Normal range of motion.     Cervical back: Normal range of motion.  Lymphadenopathy:     Cervical: No cervical adenopathy.  Skin:    General: Skin is warm and dry.     Findings: No erythema  or rash.  Neurological:     Mental Status: He is alert and oriented to person, place, and time.  Psychiatric:        Behavior: Behavior normal.        Thought Content: Thought content normal.        Judgment: Judgment normal.      Lab Results  Component Value Date   WBC 4.7 06/27/2020   HGB 7.6 (L) 06/27/2020   HCT 23.6 (L) 06/27/2020   MCV 106.8 (H) 06/27/2020  PLT 332 06/27/2020     Chemistry      Component Value Date/Time   NA 141 06/27/2020 0820   NA 142 01/05/2017 0000   K 4.5 06/27/2020 0820   CL 107 06/27/2020 0820   CO2 26 06/27/2020 0820   BUN 21 06/27/2020 0820   BUN 24 (A) 01/05/2017 0000   CREATININE 1.82 (H) 06/27/2020 0820   CREATININE 2.00 (H) 06/18/2020 0952   GLU 95 01/05/2017 0000      Component Value Date/Time   CALCIUM 9.6 06/27/2020 0820   CALCIUM 9.5 09/19/2014 0000   ALKPHOS 85 06/27/2020 0820   AST 9 (L) 06/27/2020 0820   ALT 8 06/27/2020 0820   BILITOT 0.4 06/27/2020 0820      Impression and Plan: Roberto Fields is a very nice 74 year old white male.  He has multifactorial anemia.  He is quite anemic today.  He has some heme positive stools.  I do think that he probably needs to have some type of endoscopy.  We will get him back to see Dr. Lyndel Safe.  He will need a blood transfusion.  I talked to he and his wife about a blood transfusion.  I explained why I thought he would need this.  I do still think that he would be able to make blood himself given his low erythropoietin level and what I suspect is also iron deficiency.  They agreed to the blood transfusion.  We will give him 2 units of packed red blood cells.  We will have to see what his iron level is.  We will have to at some point, set him up with Aranesp.  I really think that we can get his hemoglobin a lot higher.  I would like to get his hemoglobin above 11.  This is quite complicated.  Took [place] about 40-45 minutes with him today.  I do not expect that his hemoglobin was going to be  so low.  I know that he has trouble getting transportation.  We will have to work on getting him transportation.  I would like to see him back in a month.   [name], MD 8/19/20219:58 AM

## 2020-06-28 ENCOUNTER — Inpatient Hospital Stay: Payer: PPO

## 2020-06-28 VITALS — BP 138/55 | HR 55 | Temp 98.0°F | Resp 17

## 2020-06-28 DIAGNOSIS — D490 Neoplasm of unspecified behavior of digestive system: Secondary | ICD-10-CM

## 2020-06-28 DIAGNOSIS — Z9289 Personal history of other medical treatment: Secondary | ICD-10-CM

## 2020-06-28 DIAGNOSIS — D631 Anemia in chronic kidney disease: Secondary | ICD-10-CM | POA: Diagnosis not present

## 2020-06-28 LAB — IRON AND TIBC
Iron: 91 ug/dL (ref 42–163)
Saturation Ratios: 32 % (ref 20–55)
TIBC: 284 ug/dL (ref 202–409)
UIBC: 192 ug/dL (ref 117–376)

## 2020-06-28 LAB — KAPPA/LAMBDA LIGHT CHAINS
Kappa free light chain: 64.7 mg/L — ABNORMAL HIGH (ref 3.3–19.4)
Kappa, lambda light chain ratio: 1.4 (ref 0.26–1.65)
Lambda free light chains: 46.3 mg/L — ABNORMAL HIGH (ref 5.7–26.3)

## 2020-06-28 LAB — CEA (IN HOUSE-CHCC): CEA (CHCC-In House): <1 ng/mL (ref 0.00–5.00)

## 2020-06-28 LAB — FERRITIN: Ferritin: 77 ng/mL (ref 24–336)

## 2020-06-28 MED ORDER — ACETAMINOPHEN 325 MG PO TABS
650.0000 mg | ORAL_TABLET | Freq: Once | ORAL | Status: AC
  Administered 2020-06-28: 650 mg via ORAL

## 2020-06-28 MED ORDER — FUROSEMIDE 10 MG/ML IJ SOLN
INTRAMUSCULAR | Status: AC
  Filled 2020-06-28: qty 4

## 2020-06-28 MED ORDER — SODIUM CHLORIDE 0.9% IV SOLUTION
250.0000 mL | Freq: Once | INTRAVENOUS | Status: AC
  Administered 2020-06-28: 250 mL via INTRAVENOUS
  Filled 2020-06-28: qty 250

## 2020-06-28 MED ORDER — ACETAMINOPHEN 325 MG PO TABS
ORAL_TABLET | ORAL | Status: AC
  Filled 2020-06-28: qty 2

## 2020-06-28 NOTE — Patient Instructions (Signed)

## 2020-06-28 NOTE — Progress Notes (Signed)
Tylenol 650 mg oral prior to blood transfusion per. Dr. Marin Olp verbal order.

## 2020-06-29 LAB — BPAM RBC
Blood Product Expiration Date: 202109162359
Blood Product Expiration Date: 202109162359
ISSUE DATE / TIME: 202108200750
ISSUE DATE / TIME: 202108200750
Unit Type and Rh: 5100
Unit Type and Rh: 5100

## 2020-06-29 LAB — TYPE AND SCREEN
ABO/RH(D): O POS
Antibody Screen: NEGATIVE
Unit division: 0
Unit division: 0

## 2020-07-01 LAB — MULTIPLE MYELOMA PANEL, SERUM
Albumin SerPl Elph-Mcnc: 3.5 g/dL (ref 2.9–4.4)
Albumin/Glob SerPl: 1.3 (ref 0.7–1.7)
Alpha 1: 0.3 g/dL (ref 0.0–0.4)
Alpha2 Glob SerPl Elph-Mcnc: 0.9 g/dL (ref 0.4–1.0)
B-Globulin SerPl Elph-Mcnc: 0.9 g/dL (ref 0.7–1.3)
Gamma Glob SerPl Elph-Mcnc: 0.7 g/dL (ref 0.4–1.8)
Globulin, Total: 2.8 g/dL (ref 2.2–3.9)
IgA: 223 mg/dL (ref 61–437)
IgG (Immunoglobin G), Serum: 649 mg/dL (ref 603–1613)
IgM (Immunoglobulin M), Srm: 140 mg/dL (ref 15–143)
Total Protein ELP: 6.3 g/dL (ref 6.0–8.5)

## 2020-07-02 ENCOUNTER — Other Ambulatory Visit: Payer: Self-pay | Admitting: Hematology & Oncology

## 2020-07-04 ENCOUNTER — Other Ambulatory Visit: Payer: Self-pay | Admitting: Cardiology

## 2020-07-04 NOTE — Telephone Encounter (Signed)
Rx has been sent to the pharmacy electronically. ° °

## 2020-07-05 ENCOUNTER — Telehealth: Payer: Self-pay | Admitting: Cardiology

## 2020-07-05 NOTE — Telephone Encounter (Signed)
[name] is called and wanted to know why Dr. [name] discontinued the amlodipine medication. Please call back

## 2020-07-05 NOTE — Telephone Encounter (Signed)
Attempted to call - line was busy. Will try again later. 

## 2020-07-08 ENCOUNTER — Encounter (HOSPITAL_BASED_OUTPATIENT_CLINIC_OR_DEPARTMENT_OTHER): Payer: PPO

## 2020-07-08 DIAGNOSIS — R42 Dizziness and giddiness: Secondary | ICD-10-CM | POA: Diagnosis not present

## 2020-07-08 DIAGNOSIS — I6350 Cerebral infarction due to unspecified occlusion or stenosis of unspecified cerebral artery: Secondary | ICD-10-CM | POA: Diagnosis not present

## 2020-07-17 ENCOUNTER — Telehealth (INDEPENDENT_AMBULATORY_CARE_PROVIDER_SITE_OTHER): Payer: PPO | Admitting: Gastroenterology

## 2020-07-17 VITALS — Ht 68.0 in | Wt 160.0 lb

## 2020-07-17 DIAGNOSIS — D500 Iron deficiency anemia secondary to blood loss (chronic): Secondary | ICD-10-CM | POA: Diagnosis not present

## 2020-07-17 NOTE — Progress Notes (Addendum)
Chief Complaint: Anemia with heme positive stools.  Referring Provider:  [name], *      ASSESSMENT AND PLAN;   #1. IDA with Heme pos stools s/p 2U 06/2020. Neg CT Abdo pelvis without contrast 06/2020.  Has associated CKD4. Nl B12/folate  #2. Colon Ca (stage I T1 N0 M0) s/p sigmoid resection (Dr. Amalia Hailey) 09/26/2019.  #3. Co-morbid conditions: Afib on eliquis, CAD (Dr Demaris Callander), HTN, HLD, CKD4, H/O CVA.    Plan: -Proceed with rpt EGD/colon with miralax off eliquis 48hrs. (?sept 14 at 7 30) Discussed risks & benefits. (Risks including rare perforation req laparotomy, bleeding after biopsies/polypectomy req blood transfusion, rare chance of missing neoplasms, risks of anesthesia/sedation). Benefits outweigh the risks. Patient agrees to proceed. All the questions were answered. -CBC, CMP at time of endoscopic procedures. -If above neg, consider CE -High risk for any endoscopic procedures.  Patient and patient's wife understand.    HPI:    Roberto Fields is a 74 y.o. male  (for televisit due to transportation problems) With heme positive stools with slow drop in Hb from 9.3 to 7.6 (MCV 106) s/p 2U PRBC and IV iron/aranesp. Nl CEA, neg CT AP without contrast. No overt GI bleeding. No melena Seen by hematology.  Advised GI work-up by means of rpt EGD and colonoscopy d/t heme positive stools..  No nausea, vomiting, heartburn, regurgitation, odynophagia or dysphagia.  No significant diarrhea or constipation.  There is no melena or hematochezia. No unintentional weight loss.  Remote H/O EGD/colon 2010 - Dr Quentin Cornwall.  Apparently had peptic ulcer disease, had significant polyp which was later removed same year at [facility].  (larger polyps) -told to get it repeated in 2016.  Unfortunately, patient had a stroke.  Past GI procedures:  -CT Abdo/pelvis without contrast 06/21/2020 1. Cholelithiasis without cholecystitis. 2. Distal colonic diverticulosis without diverticulitis.  No  recurrence. 3. Aortic Atherosclerosis   Colonoscopy 08/28/2019 - Distal sigmoid colon mass. Biopsied. Tattooed. - Colonic polyps s/p polypectomy. Bx-hyperplastic. - Mod to severe sigmoid diverticulosis.  Patient underwent sigmoid resection on 09/26/2019 (Dr Amalia Hailey).  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  H/O advanced colonic polyps at [place] S/P trans-anal resection (serrated adenoma). Patient did not go for FU colonoscopies.  EGD 08/28/2019 -Mildly prominent gastric folds (? Etiology) -biopsied - neg Bx -Incidental gastric polyps (s/p polypectomy x 3) Bx-fundic gland polyps. -No active upper GI bleeding. -Neg SB bx for celiac.  Past Medical History:  Diagnosis Date  . Arthritis   . Atrial fibrillation ([place])   . BPH (benign prostatic hyperplasia)   . CAD (coronary artery disease)   . Cancer of sigmoid colon ([place]) 06/27/2020  . Cataract    bilateral cataracts removed  . COPD (chronic obstructive pulmonary disease) ([place])   . CVA (cerebral infarction)   . Dry eye   . 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 Glaucoma   . History of blood clots   . History of colon polyps   . Hyperlipidemia   . Hypertension   . Hypothyroid   . Iron deficiency anemia due to chronic blood loss 06/27/2020  . Myocardial infarction ([place]) 2010  . Renal insufficiency    stage 4   . Stomach ulcer   . Stroke ([place]) 2010  . Ulcer 1992   HX peptic ulcer- DX: by endoscopy    Past Surgical History:  Procedure Laterality Date  . arm surgery    . COLONOSCOPY     Dr  Gibbs in [place]. Dr Unk Lightning removed a larger polyp  . CORONARY STENT PLACEMENT  04/2011   [facility], [facility]  . ESOPHAGOGASTRODUODENOSCOPY     dr Lavina Hamman in [place]  . EYE SURGERY    . HERNIA MESH REMOVAL    . [place]  . TRANSURETHRAL RESECTION OF PROSTATE      Family History  Problem Relation Age of Onset  . Heart disease Father   . Heart attack  Father   . Coronary artery disease Brother   . Heart attack Brother 36       pacemaker and defibrillator  . Coronary artery disease Brother   . Leukemia Mother   . Hypertension Other        family history of  . Heart disease Sister   . Stomach cancer Sister   . Breast cancer Sister   . Lung cancer Sister   . Breast cancer Sister   . Lung cancer Sister   . Colon cancer Neg Hx   . Esophageal cancer Neg Hx   . Rectal cancer Neg Hx     Social History   Tobacco Use  . Smoking status: Former Research scientist (life sciences)  . Smokeless tobacco: Never Used  Vaping Use  . Vaping Use: Never used  Substance Use Topics  . Alcohol use: No  . Drug use: No    Current Outpatient Medications  Medication Sig Dispense Refill  . Acetaminophen (TYLENOL PO) Take by mouth.    Marland Kitchen amLODipine (NORVASC) 5 MG tablet Take 1 tablet by mouth once daily 90 tablet 0  . Docusate Calcium (STOOL SOFTENER PO) Take by mouth as needed.     Marland Kitchen ELIQUIS 5 MG TABS tablet Take 1 tablet by mouth twice daily 180 tablet 0  . Ferrous Sulfate (IRON SUPPLEMENT PO) Take 1 tablet by mouth daily.    Marland Kitchen gabapentin (NEURONTIN) 300 MG capsule Take 600 mg by mouth 2 (two) times daily.     . Latanoprost (XELPROS) 0.005 % EMUL Apply 1 drop to eye at bedtime.    Marland Kitchen levothyroxine (EUTHYROX) 88 MCG tablet Take 1 tablet (88 mcg total) by mouth daily. 90 tablet 1  . meclizine (ANTIVERT) 25 MG tablet TAKE 1 TABLET BY MOUTH THREE TIMES DAILY AS NEEDED FOR DIZZINESS (Patient taking differently: Take 25 mg by mouth as needed. ) 90 tablet 0  . niacin (NIASPAN) 500 MG CR tablet Take 500 mg by mouth daily.     Marland Kitchen omeprazole (PRILOSEC) 40 MG capsule Take 1 capsule by mouth once daily 90 capsule 3  . REFRESH CELLUVISC 1 % GEL Apply 1 drop to eye 3 (three) times daily.     Marland Kitchen telmisartan (MICARDIS) 80 MG tablet Take 1/2 (one-half) tablet by mouth once daily 45 tablet 1  . timolol (TIMOPTIC) 0.5 % ophthalmic solution 1 drop daily.     . vitamin B-12 (CYANOCOBALAMIN) 1000  MCG tablet Take 1 tablet by mouth daily.    Marland Kitchen albuterol (PROVENTIL HFA;VENTOLIN HFA) 108 (90 Base) MCG/ACT inhaler Inhale 2 puffs into the lungs every 6 (six) hours as needed for wheezing. (Patient not taking: Reported on 07/17/2020) 18 g 4  . nitroGLYCERIN (NITROSTAT) 0.4 MG SL tablet DISSOLVE ONE TABLET UNDER THE TONGUE EVERY 5 MINUTES AS NEEDED FOR CHEST PAIN (Patient not taking: Reported on 07/17/2020) 100 tablet prn   No current facility-administered medications for this visit.    Allergies  Allergen Reactions  . Atorvastatin Other (See Comments)  REACTION: Muscle weakness  . Nsaids Other (See Comments)    Avoid with current kidney function.    . Colesevelam Other (See Comments)    REACTION: joint pain  . Pravastatin Other (See Comments)    Muscle aches  . Simvastatin Other (See Comments)    REACTION: Myalgias    Review of Systems:  neg     Physical Exam:    Ht 5\' 8"  (1.727 m)   Wt 160 lb (72.6 kg)   BMI 24.33 kg/m  Filed Weights   07/17/20 0827  Weight: 160 lb (72.6 kg)   Not examined since was a televisit  Data Reviewed: I have personally reviewed following labs and imaging studies  CBC: CBC Latest Ref Rng & Units 06/27/2020 06/18/2020 09/06/2019  WBC 4.0 - 10.5 K/uL 4.7 5.9 4.4  Hemoglobin 13.0 - 17.0 g/dL 7.6(L) 7.8(L) 9.1(L)  Hematocrit 39 - 52 % 23.6(L) 23.6(L) 26.8(L)  Platelets 150 - 400 K/uL 332 214 141.0(L)    CMP: CMP Latest Ref Rng & Units 06/27/2020 06/18/2020 05/27/2020  Glucose 70 - 99 mg/dL 106(H) 102(H) 97  BUN 8 - 23 mg/dL 21 28(H) 31(H)  Creatinine 0.61 - 1.24 mg/dL 1.82(H) 2.00(H) 1.84(H)  Sodium 135 - 145 mmol/L 141 141 139  Potassium 3.5 - 5.1 mmol/L 4.5 4.6 5.9(H)  Chloride 98 - 111 mmol/L 107 108 108  CO2 22 - 32 mmol/L 26 22 26   Calcium 8.9 - 10.3 mg/dL 9.6 9.0 9.3  Total Protein 6.5 - 8.1 g/dL 6.7 6.3 6.4  Total Bilirubin 0.3 - 1.2 mg/dL 0.4 0.6 0.8  Alkaline Phos 38 - 126 U/L 85 - -  AST 15 - 41 U/L 9(L) 10 12  ALT 0 - 44 U/L 8 9  6(L)   I connected with  Roberto Fields on 07/19/20 by a video enabled telemedicine application and verified that I am speaking with the correct person using two identifiers.   I discussed the limitations of evaluation and management by telemedicine. The patient expressed understanding and agreed to proceed.  My location: [facility] office.  Patient's location: home  I also discussed with patient's wife.   Roberto Austria, MD 07/17/2020, 8:42 AM  Cc: [name], *

## 2020-07-19 ENCOUNTER — Inpatient Hospital Stay (HOSPITAL_BASED_OUTPATIENT_CLINIC_OR_DEPARTMENT_OTHER): Admission: RE | Admit: 2020-07-19 | Payer: PPO | Source: Ambulatory Visit

## 2020-07-29 ENCOUNTER — Other Ambulatory Visit: Payer: Self-pay | Admitting: Hematology & Oncology

## 2020-07-29 ENCOUNTER — Inpatient Hospital Stay: Payer: PPO

## 2020-07-29 ENCOUNTER — Other Ambulatory Visit: Payer: Self-pay

## 2020-07-29 ENCOUNTER — Inpatient Hospital Stay: Payer: PPO | Attending: Hematology & Oncology | Admitting: Hematology & Oncology

## 2020-07-29 ENCOUNTER — Telehealth: Payer: Self-pay | Admitting: Hematology & Oncology

## 2020-07-29 VITALS — BP 146/45 | HR 47 | Temp 97.5°F | Resp 17 | Wt 163.0 lb

## 2020-07-29 DIAGNOSIS — D631 Anemia in chronic kidney disease: Secondary | ICD-10-CM | POA: Diagnosis not present

## 2020-07-29 DIAGNOSIS — Z79899 Other long term (current) drug therapy: Secondary | ICD-10-CM | POA: Diagnosis not present

## 2020-07-29 DIAGNOSIS — N289 Disorder of kidney and ureter, unspecified: Secondary | ICD-10-CM | POA: Diagnosis present

## 2020-07-29 DIAGNOSIS — C187 Malignant neoplasm of sigmoid colon: Secondary | ICD-10-CM

## 2020-07-29 DIAGNOSIS — D500 Iron deficiency anemia secondary to blood loss (chronic): Secondary | ICD-10-CM | POA: Diagnosis not present

## 2020-07-29 DIAGNOSIS — Z85038 Personal history of other malignant neoplasm of large intestine: Secondary | ICD-10-CM | POA: Insufficient documentation

## 2020-07-29 DIAGNOSIS — N189 Chronic kidney disease, unspecified: Secondary | ICD-10-CM | POA: Insufficient documentation

## 2020-07-29 DIAGNOSIS — Z7901 Long term (current) use of anticoagulants: Secondary | ICD-10-CM | POA: Diagnosis not present

## 2020-07-29 DIAGNOSIS — I480 Paroxysmal atrial fibrillation: Secondary | ICD-10-CM | POA: Diagnosis not present

## 2020-07-29 LAB — CBC WITH DIFFERENTIAL (CANCER CENTER ONLY)
Abs Immature Granulocytes: 0.03 10*3/uL (ref 0.00–0.07)
Basophils Absolute: 0 10*3/uL (ref 0.0–0.1)
Basophils Relative: 0 %
Eosinophils Absolute: 0.1 10*3/uL (ref 0.0–0.5)
Eosinophils Relative: 3 %
HCT: 30.6 % — ABNORMAL LOW (ref 39.0–52.0)
Hemoglobin: 9.9 g/dL — ABNORMAL LOW (ref 13.0–17.0)
Immature Granulocytes: 1 %
Lymphocytes Relative: 28 %
Lymphs Abs: 1.2 10*3/uL (ref 0.7–4.0)
MCH: 33.1 pg (ref 26.0–34.0)
MCHC: 32.4 g/dL (ref 30.0–36.0)
MCV: 102.3 fL — ABNORMAL HIGH (ref 80.0–100.0)
Monocytes Absolute: 0.5 10*3/uL (ref 0.1–1.0)
Monocytes Relative: 11 %
Neutro Abs: 2.5 10*3/uL (ref 1.7–7.7)
Neutrophils Relative %: 57 %
Platelet Count: 160 10*3/uL (ref 150–400)
RBC: 2.99 MIL/uL — ABNORMAL LOW (ref 4.22–5.81)
RDW: 14.6 % (ref 11.5–15.5)
WBC Count: 4.4 10*3/uL (ref 4.0–10.5)
nRBC: 0 % (ref 0.0–0.2)

## 2020-07-29 LAB — CMP (CANCER CENTER ONLY)
ALT: 6 U/L (ref 0–44)
AST: 10 U/L — ABNORMAL LOW (ref 15–41)
Albumin: 4.2 g/dL (ref 3.5–5.0)
Alkaline Phosphatase: 94 U/L (ref 38–126)
Anion gap: 6 (ref 5–15)
BUN: 28 mg/dL — ABNORMAL HIGH (ref 8–23)
CO2: 26 mmol/L (ref 22–32)
Calcium: 9.9 mg/dL (ref 8.9–10.3)
Chloride: 110 mmol/L (ref 98–111)
Creatinine: 1.97 mg/dL — ABNORMAL HIGH (ref 0.61–1.24)
GFR, Est AFR Am: 37 mL/min — ABNORMAL LOW (ref >60)
GFR, Estimated: 32 mL/min — ABNORMAL LOW (ref >60)
Glucose, Bld: 106 mg/dL — ABNORMAL HIGH (ref 70–99)
Potassium: 5.8 mmol/L — ABNORMAL HIGH (ref 3.5–5.1)
Sodium: 142 mmol/L (ref 135–145)
Total Bilirubin: 0.6 mg/dL (ref 0.3–1.2)
Total Protein: 6.9 g/dL (ref 6.5–8.1)

## 2020-07-29 LAB — IRON AND TIBC
Iron: 121 ug/dL (ref 42–163)
Saturation Ratios: 44 % (ref 20–55)
TIBC: 276 ug/dL (ref 202–409)
UIBC: 155 ug/dL (ref 117–376)

## 2020-07-29 LAB — RETICULOCYTES
Immature Retic Fract: 9.1 % (ref 2.3–15.9)
RBC.: 2.96 MIL/uL — ABNORMAL LOW (ref 4.22–5.81)
Retic Count, Absolute: 72.5 10*3/uL (ref 19.0–186.0)
Retic Ct Pct: 2.5 % (ref 0.4–3.1)

## 2020-07-29 LAB — SAVE SMEAR(SSMR), FOR PROVIDER SLIDE REVIEW

## 2020-07-29 LAB — FERRITIN: Ferritin: 91 ng/mL (ref 24–336)

## 2020-07-29 MED ORDER — DARBEPOETIN ALFA 300 MCG/0.6ML IJ SOSY
300.0000 ug | PREFILLED_SYRINGE | Freq: Once | INTRAMUSCULAR | Status: AC
  Administered 2020-07-29: 300 ug via SUBCUTANEOUS

## 2020-07-29 NOTE — Patient Instructions (Signed)
Darbepoetin Alfa injection What is this medicine? DARBEPOETIN ALFA (dar be POE e tin AL fa) helps your body make more red blood cells. It is used to treat anemia caused by chronic kidney failure and chemotherapy. This medicine may be used for other purposes; ask your health care provider or pharmacist if you have questions. COMMON BRAND NAME(S): Aranesp What should I tell my health care provider before I take this medicine? They need to know if you have any of these conditions:  blood clotting disorders or history of blood clots  cancer patient not on chemotherapy  cystic fibrosis  heart disease, such as angina, heart failure, or a history of a heart attack  hemoglobin level of 12 g/dL or greater  high blood pressure  low levels of folate, iron, or vitamin B12  seizures  an unusual or allergic reaction to darbepoetin, erythropoietin, albumin, hamster proteins, latex, other medicines, foods, dyes, or preservatives  pregnant or trying to get pregnant  breast-feeding How should I use this medicine? This medicine is for injection into a vein or under the skin. It is usually given by a health care professional in a hospital or clinic setting. If you get this medicine at home, you will be taught how to prepare and give this medicine. Use exactly as directed. Take your medicine at regular intervals. Do not take your medicine more often than directed. It is important that you put your used needles and syringes in a special sharps container. Do not put them in a trash can. If you do not have a sharps container, call your pharmacist or healthcare provider to get one. A special MedGuide will be given to you by the pharmacist with each prescription and refill. Be sure to read this information carefully each time. Talk to your pediatrician regarding the use of this medicine in children. While this medicine may be used in children as young as 1 month of age for selected conditions, precautions do  apply. Overdosage: If you think you have taken too much of this medicine contact a poison control center or emergency room at once. NOTE: This medicine is only for you. Do not share this medicine with others. What if I miss a dose? If you miss a dose, take it as soon as you can. If it is almost time for your next dose, take only that dose. Do not take double or extra doses. What may interact with this medicine? Do not take this medicine with any of the following medications:  epoetin alfa This list may not describe all possible interactions. Give your health care provider a list of all the medicines, herbs, non-prescription drugs, or dietary supplements you use. Also tell them if you smoke, drink alcohol, or use illegal drugs. Some items may interact with your medicine. What should I watch for while using this medicine? Your condition will be monitored carefully while you are receiving this medicine. You may need blood work done while you are taking this medicine. This medicine may cause a decrease in vitamin B6. You should make sure that you get enough vitamin B6 while you are taking this medicine. Discuss the foods you eat and the vitamins you take with your health care professional. What side effects may I notice from receiving this medicine? Side effects that you should report to your doctor or health care professional as soon as possible:  allergic reactions like skin rash, itching or hives, swelling of the face, lips, or tongue  breathing problems  changes in   vision  chest pain  confusion, trouble speaking or understanding  feeling faint or lightheaded, falls  high blood pressure  muscle aches or pains  pain, swelling, warmth in the leg  rapid weight gain  severe headaches  sudden numbness or weakness of the face, arm or leg  trouble walking, dizziness, loss of balance or coordination  seizures (convulsions)  swelling of the ankles, feet, hands  unusually weak or  tired Side effects that usually do not require medical attention (report to your doctor or health care professional if they continue or are bothersome):  diarrhea  fever, chills (flu-like symptoms)  headaches  nausea, vomiting  redness, stinging, or swelling at site where injected This list may not describe all possible side effects. Call your doctor for medical advice about side effects. You may report side effects to FDA at 1-800-FDA-1088. Where should I keep my medicine? Keep out of the reach of children. Store in a refrigerator between 2 and 8 degrees C (36 and 46 degrees F). Do not freeze. Do not shake. Throw away any unused portion if using a single-dose vial. Throw away any unused medicine after the expiration date. NOTE: This sheet is a summary. It may not cover all possible information. If you have questions about this medicine, talk to your doctor, pharmacist, or health care provider.  2020 Elsevier/Gold Standard (2017-11-10 16:44:20)  

## 2020-07-29 NOTE — Progress Notes (Signed)
Hematology and Oncology Follow Up Visit  Roberto Fields [id number] January 23, 1946 74 y.o. 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  We last saw him back in August, he was quite anemic.  I will know if this was secondary to him having surgery for his early stage colon cancer.  He is hemoglobin is 7.6.  He did get erythropoietin.  I think he may have been transfused also.  He was supposed to see Dr. Lyndel Safe of gastroenterology.  I do not think this ever happened.  Today, he feels better.  His hemoglobin is 9.9.  His iron studies look okay.  His ferritin was 91 with an iron saturation of 44%.  He has not noted any obvious bleeding.  He has had no bruising.  He has had no leg swelling.  There has been no cough or shortness of breath.  His appetite seems to be doing pretty well.  I think he had a decent weekend.  Overall, his performance status is ECOG 1.    Medications:  Current Outpatient Medications:  .  Acetaminophen (TYLENOL PO), Take by mouth., Disp: , Rfl:  .  albuterol (PROVENTIL HFA;VENTOLIN HFA) 108 (90 Base) MCG/ACT inhaler, Inhale 2 puffs into the lungs every 6 (six) hours as needed for wheezing. (Patient not taking: Reported on 07/17/2020), Disp: 18 g, Rfl: 4 .  amLODipine (NORVASC) 5 MG tablet, Take 1 tablet by mouth once daily, Disp: 90 tablet, Rfl: 0 .  Docusate Calcium (STOOL SOFTENER PO), Take by mouth as needed. , Disp: , Rfl:  .  ELIQUIS 5 MG TABS tablet, Take 1 tablet by mouth twice daily, Disp: 180 tablet, Rfl: 0 .  Ferrous Sulfate (IRON  SUPPLEMENT PO), Take 1 tablet by mouth daily., Disp: , Rfl:  .  gabapentin (NEURONTIN) 300 MG capsule, Take 600 mg by mouth 2 (two) times daily. , Disp: , Rfl:  .  Latanoprost (XELPROS) 0.005 % EMUL, Apply 1 drop to eye at bedtime., Disp: , Rfl:  .  levothyroxine (EUTHYROX) 88 MCG tablet, Take 1 tablet (88 mcg total) by mouth daily., Disp: 90 tablet, Rfl: 1 .  meclizine (ANTIVERT) 25 MG tablet, TAKE 1 TABLET BY MOUTH THREE TIMES DAILY AS NEEDED FOR DIZZINESS (Patient taking differently: Take 25 mg by mouth as needed. ), Disp: 90 tablet, Rfl: 0 .  niacin (NIASPAN) 500 MG CR tablet, Take 500 mg by mouth daily. , Disp: , Rfl:  .  nitroGLYCERIN (NITROSTAT) 0.4 MG SL tablet, DISSOLVE ONE TABLET UNDER THE TONGUE EVERY 5 MINUTES AS NEEDED FOR CHEST PAIN (Patient not taking: Reported on 07/17/2020), Disp: 100 tablet, Rfl: prn .  omeprazole (PRILOSEC) 40 MG capsule, Take 1 capsule by mouth once daily, Disp: 90 capsule, Rfl: 3 .  REFRESH CELLUVISC 1 % GEL, Apply 1 drop to eye 3 (three) times daily. , Disp: , Rfl:  .  telmisartan (MICARDIS) 80 MG tablet, Take 1/2 (one-half) tablet by mouth once daily, Disp: 45 tablet, Rfl: 1 .  timolol (TIMOPTIC) 0.5 % ophthalmic solution, 1 drop daily. , Disp: , Rfl:  .  vitamin B-12 (CYANOCOBALAMIN) 1000 MCG tablet, Take 1 tablet by mouth daily., Disp: , Rfl:   Allergies:  Allergies  Allergen Reactions  . Atorvastatin Other (See Comments)    REACTION: Muscle weakness  . Nsaids Other (See Comments)    Avoid with current kidney function.    . Colesevelam Other (See Comments)    REACTION: joint pain  . Pravastatin Other (See Comments)    Muscle aches  . Simvastatin Other (See Comments)    REACTION: Myalgias    Past Medical History, Surgical history, Social history, and Family History were reviewed and updated.  Review of Systems: Review of Systems  Constitutional: Positive for fatigue.  HENT:  Negative.   Eyes: Negative.   Respiratory: Negative.     Cardiovascular: Positive for palpitations.  Gastrointestinal: Positive for blood in stool.  Endocrine: Negative.   Genitourinary: Negative.    Musculoskeletal: Negative.   Skin: Negative.   Neurological: Negative.   Hematological: Negative.   Psychiatric/Behavioral: Negative.     Physical Exam:  weight is 163 lb (73.9 kg). His oral temperature is 97.5 F (36.4 C) (abnormal). His blood pressure is 146/45 (abnormal) and his pulse is 47 (abnormal). His respiration is 17 and oxygen saturation is 100%.   Wt Readings from Last 3 Encounters:  07/29/20 163 lb (73.9 kg)  07/17/20 160 lb (72.6 kg)  06/27/20 166 lb (75.3 kg)    Physical Exam Vitals reviewed.  HENT:     Head: Normocephalic and atraumatic.  Eyes:     Pupils: Pupils are equal, round, and reactive to light.  Cardiovascular:     Rate and Rhythm: Normal rate and regular rhythm.     Heart sounds: Normal heart sounds.  Pulmonary:     Effort: Pulmonary effort is normal.     Breath sounds: Normal breath sounds.  Abdominal:     General: Bowel sounds are normal.     Palpations: Abdomen is soft.  Musculoskeletal:        General: No tenderness or deformity. Normal range of motion.     Cervical back: Normal range of motion.  Lymphadenopathy:     Cervical: No cervical adenopathy.  Skin:    General: Skin is warm and dry.     Findings: No erythema or rash.  Neurological:     Mental Status: He is alert and oriented to person, place, and time.  Psychiatric:        Behavior: Behavior normal.        Thought Content: Thought content normal.        Judgment: Judgment normal.      Lab Results  Component Value Date   WBC 4.4 07/29/2020   HGB 9.9 (L) 07/29/2020   HCT 30.6 (L) 07/29/2020   MCV 102.3 (H) 07/29/2020   PLT 160 07/29/2020     Chemistry      Component Value Date/Time   NA 142 07/29/2020 0849   NA 142 01/05/2017 0000   K 5.8 (H) 07/29/2020 0849   CL 110 07/29/2020 0849   CO2 26 07/29/2020 0849   BUN 28 (H)  07/29/2020 0849   BUN 24 (A) 01/05/2017 0000   CREATININE 1.97 (H) 07/29/2020 0849   CREATININE 2.00 (H) 06/18/2020 0952   GLU 95 01/05/2017 0000      Component Value Date/Time   CALCIUM 9.9 07/29/2020 0849   CALCIUM 9.5 09/19/2014 0000   ALKPHOS 94 07/29/2020 0849   AST 10 (L) 07/29/2020 0849   ALT 6 07/29/2020 0849  BILITOT 0.6 07/29/2020 0849      Impression and Plan: Mr. Harb is a very nice 74 year old white male.  He has multifactorial anemia.  His anemia is better today.  Again we will give him some Aranesp.  Hopefully we can continue to get his hemoglobin up higher.  It would be nice if we could get him to see Dr. Lyndel Safe.  I am sure Dr. Lyndel Safe can do anything right now since the hemoglobin is improving.  There is no obvious GI bleeding.  We can try to get him back in about 3 weeks for lab work and for possible Aranesp.  Again we hopefully can keep his hemoglobin getting better.  I would like to see his hemoglobin almost up to normal when the holidays come in November.   [name], MD 9/20/20219:43 AM

## 2020-07-29 NOTE — Telephone Encounter (Signed)
Appointments scheduled calendar printed & transportation notified.  Waiver & patient intake sheet faxed over to Transportation per 9/20 los

## 2020-07-30 ENCOUNTER — Telehealth: Payer: Self-pay | Admitting: Hematology & Oncology

## 2020-07-30 NOTE — Telephone Encounter (Signed)
No los 9/20

## 2020-08-01 ENCOUNTER — Other Ambulatory Visit: Payer: Self-pay | Admitting: Cardiology

## 2020-08-05 ENCOUNTER — Telehealth: Payer: Self-pay

## 2020-08-05 ENCOUNTER — Other Ambulatory Visit: Payer: Self-pay

## 2020-08-05 ENCOUNTER — Telehealth: Payer: Self-pay | Admitting: Gastroenterology

## 2020-08-05 DIAGNOSIS — D500 Iron deficiency anemia secondary to blood loss (chronic): Secondary | ICD-10-CM

## 2020-08-05 NOTE — Telephone Encounter (Signed)
[facility] Pre-operative Risk Assessment     Request for surgical clearance:     Endoscopy Procedure  What type of surgery is being performed?     Colonoscopy  When is this surgery scheduled?     08/19/20  What type of clearance is required ?   Pharmacy  Are there any medications that need to be held prior to surgery and how long? Eliquis   Practice name and name of physician performing surgery?      [facility]  What is your office phone and fax number?      Phone- [phone]  [phone]  Anesthesia type (None, local, MAC, general) ?       MAC

## 2020-08-05 NOTE — Telephone Encounter (Signed)
Pt is requesting a call back from a nurse regarding his colonoscopy prep.

## 2020-08-06 ENCOUNTER — Encounter: Payer: Self-pay | Admitting: Gastroenterology

## 2020-08-06 NOTE — Telephone Encounter (Signed)
Patient with diagnosis of afib on Eliquis for anticoagulation.    Procedure:  Colonoscopy Date of procedure: 08/19/2020  CHADS2-VASc score of  6 (HTN, AGE, stroke/tia x 2, CAD, AGE)  CrCl 30.8 ml/min  Per office protocol, patient can hold Eliquis for 1 day prior to procedure.

## 2020-08-06 NOTE — Telephone Encounter (Signed)
   Primary Cardiologist: Kirk Ruths, MD  Chart reviewed as part of pre-operative protocol coverage. Given past medical history and time since last visit, based on ACC/AHA guidelines, RANA ADORNO would be at acceptable risk for the planned procedure without further cardiovascular testing. Patient has chronic rare chest discomfort, this is unchanged recently, no increase in frequency or duration.   The patient was advised that if he develops new symptoms prior to surgery to contact our office to arrange for a follow-up visit, and he verbalized understanding.  I will route this recommendation to the requesting party via Epic fax function and remove from pre-op pool.  Please call with questions.   Patient will need to hold Eliquis for 1 day prior to the surgery and restart as soon as possible after the procedure at the surgeon's discretion.   [place], [place] 08/06/2020, 5:52 PM

## 2020-08-07 NOTE — Telephone Encounter (Signed)
Patient was told to hold eliquis 1 day prior procedure and voiced understanding. Patient said he hasn't received any instructions and I told him to call back on the 5th if he hasn't received anything in the mail

## 2020-08-19 ENCOUNTER — Ambulatory Visit (AMBULATORY_SURGERY_CENTER): Payer: PPO | Admitting: Gastroenterology

## 2020-08-19 ENCOUNTER — Encounter: Payer: PPO | Admitting: Gastroenterology

## 2020-08-19 ENCOUNTER — Encounter: Payer: Self-pay | Admitting: Gastroenterology

## 2020-08-19 ENCOUNTER — Other Ambulatory Visit: Payer: Self-pay

## 2020-08-19 ENCOUNTER — Other Ambulatory Visit (INDEPENDENT_AMBULATORY_CARE_PROVIDER_SITE_OTHER): Payer: PPO

## 2020-08-19 VITALS — BP 105/52 | HR 50 | Temp 97.2°F | Resp 16 | Ht 68.0 in | Wt 160.0 lb

## 2020-08-19 DIAGNOSIS — D509 Iron deficiency anemia, unspecified: Secondary | ICD-10-CM

## 2020-08-19 DIAGNOSIS — D500 Iron deficiency anemia secondary to blood loss (chronic): Secondary | ICD-10-CM | POA: Diagnosis not present

## 2020-08-19 DIAGNOSIS — K317 Polyp of stomach and duodenum: Secondary | ICD-10-CM

## 2020-08-19 DIAGNOSIS — K648 Other hemorrhoids: Secondary | ICD-10-CM | POA: Diagnosis not present

## 2020-08-19 DIAGNOSIS — K2970 Gastritis, unspecified, without bleeding: Secondary | ICD-10-CM

## 2020-08-19 DIAGNOSIS — K5730 Diverticulosis of large intestine without perforation or abscess without bleeding: Secondary | ICD-10-CM | POA: Diagnosis not present

## 2020-08-19 DIAGNOSIS — K5520 Angiodysplasia of colon without hemorrhage: Secondary | ICD-10-CM

## 2020-08-19 LAB — COMPREHENSIVE METABOLIC PANEL
ALT: 9 U/L (ref 0–53)
AST: 12 U/L (ref 0–37)
Albumin: 4.4 g/dL (ref 3.5–5.2)
Alkaline Phosphatase: 102 U/L (ref 39–117)
BUN: 23 mg/dL (ref 6–23)
CO2: 24 mEq/L (ref 19–32)
Calcium: 9.4 mg/dL (ref 8.4–10.5)
Chloride: 107 mEq/L (ref 96–112)
Creatinine, Ser: 1.78 mg/dL — ABNORMAL HIGH (ref 0.40–1.50)
GFR: 36.14 mL/min — ABNORMAL LOW (ref >60.00)
Glucose, Bld: 100 mg/dL — ABNORMAL HIGH (ref 70–99)
Potassium: 5.3 mEq/L — ABNORMAL HIGH (ref 3.5–5.1)
Sodium: 137 mEq/L (ref 135–145)
Total Bilirubin: 0.8 mg/dL (ref 0.2–1.2)
Total Protein: 7.4 g/dL (ref 6.0–8.3)

## 2020-08-19 LAB — CBC WITH DIFFERENTIAL/PLATELET
Basophils Absolute: 0 10*3/uL (ref 0.0–0.1)
Basophils Relative: 0.9 % (ref 0.0–3.0)
Eosinophils Absolute: 0.1 10*3/uL (ref 0.0–0.7)
Eosinophils Relative: 1.8 % (ref 0.0–5.0)
HCT: 35.5 % — ABNORMAL LOW (ref 39.0–52.0)
Hemoglobin: 11.8 g/dL — ABNORMAL LOW (ref 13.0–17.0)
Lymphocytes Relative: 27.1 % (ref 12.0–46.0)
Lymphs Abs: 1.1 10*3/uL (ref 0.7–4.0)
MCHC: 33.3 g/dL (ref 30.0–36.0)
MCV: 102.5 fl — ABNORMAL HIGH (ref 78.0–100.0)
Monocytes Absolute: 0.3 10*3/uL (ref 0.1–1.0)
Monocytes Relative: 6.9 % (ref 3.0–12.0)
Neutro Abs: 2.5 10*3/uL (ref 1.4–7.7)
Neutrophils Relative %: 63.3 % (ref 43.0–77.0)
Platelets: 219 10*3/uL (ref 150.0–400.0)
RBC: 3.47 Mil/uL — ABNORMAL LOW (ref 4.22–5.81)
RDW: 16.3 % — ABNORMAL HIGH (ref 11.5–15.5)
WBC: 4 10*3/uL (ref 4.0–10.5)

## 2020-08-19 MED ORDER — SODIUM CHLORIDE 0.9 % IV SOLN: 500.0000 mL | Freq: Once | INTRAVENOUS | Status: DC

## 2020-08-19 NOTE — Progress Notes (Signed)
PT taken to PACU. Monitors in place. VSS. Report given to RN. 

## 2020-08-19 NOTE — Op Note (Signed)
[facility] Patient Name: Roberto Fields Procedure Date: 08/19/2020 10:45 AM MRN: [id number] Endoscopist: Jackquline Denmark , MD Age: 74 Referring MD:  Date of Birth: 05/28/1946 Gender: Male Account #: [id number] Procedure:                Upper GI endoscopy Indications:              Iron deficiency anemia secondary to chronic blood                            loss Medicines:                Monitored Anesthesia Care Procedure:                Pre-Anesthesia Assessment:                           - Prior to the procedure, a History and Physical                            was performed, and patient medications and                            allergies were reviewed. The patient's tolerance of                            previous anesthesia was also reviewed. The risks                            and benefits of the procedure and the sedation                            options and risks were discussed with the patient.                            All questions were answered, and informed consent                            was obtained. Prior Anticoagulants: The patient has                            taken Eliquis (apixaban), last dose was 1 day prior                            to procedure. ASA Grade Assessment: III - A patient                            with severe systemic disease. After reviewing the                            risks and benefits, the patient was deemed in                            satisfactory condition to undergo the procedure.  After obtaining informed consent, the endoscope was                            passed under direct vision. Throughout the                            procedure, the patient's blood pressure, pulse, and                            oxygen saturations were monitored continuously. The                            Endoscope was introduced through the mouth, and                            advanced to the second part of duodenum.  The upper                            GI endoscopy was accomplished without difficulty.                            The patient tolerated the procedure well. Scope In: Scope Out: Findings:                 The examined esophagus was normal with well-defined                            Z-line at 35 cm. No varices were noted.                           Multiple (15-20) 2 to 4 mm sessile polyps                            (previously determined to be fundic gland polyps)                            with no bleeding and no stigmata of recent bleeding                            were found in the gastric body and fundus with                            prominent gastric folds. Biopsies were taken with a                            cold forceps for histology.                           Mild gastric antral vascular ectasia without                            bleeding was present in the gastric antrum.  The examined duodenum was normal. Biopsies were not                            performed as previous biopsies were negative for                            celiac disease. Complications:            No immediate complications. Estimated Blood Loss:     Estimated blood loss: none. 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 No endoscopic                            evidence of portal hypertension.                           - Multiple gastric polyps. Biopsied. Recommendation:           - Patient has a contact number available for                            emergencies. The signs and symptoms of potential                            delayed complications were discussed with the                            patient. Return to normal activities tomorrow.                            Written discharge instructions were provided to the                            patient.                           - Resume previous diet.                           - Continue present medications.                            - Await pathology results.                           - The findings and recommendations were discussed                            with the patient's family.                           - He has multiple etiologies of anemia including                            CKD. However, if he requires blood transfusions and  continues to have heme positive stools, would                            recommend repeat EGD with APC/RFA at [facility]. For now,                            can resume Eliquis in a.m. Jackquline Denmark, MD 08/19/2020 11:18:05 AM This report has been signed electronically.

## 2020-08-19 NOTE — Op Note (Signed)
[facility] Patient Name: Roberto Fields Procedure Date: 08/19/2020 10:44 AM MRN: [id number] Endoscopist: Jackquline Denmark , MD Age: 74 Referring MD:  Date of Birth: Apr 19, 1946 Gender: Male Account #: [id number] Procedure:                Colonoscopy Indications:              #1. IDA with Heme pos stools s/p 2U 06/2020. Neg CT                            Abdo pelvis without contrast 06/2020. Has associated                            CKD4. Nl B12/folate                           #2. 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 Amalia Hailey) 09/26/2019.                           #3. Co-morbid conditions: Afib on eliquis, CAD (Dr                            Demaris Callander), HTN, HLD, CKD4, H/O CVA. Medicines:                Monitored Anesthesia Care Procedure:                Pre-Anesthesia Assessment:                           - Prior to the procedure, a History and Physical                            was performed, and patient medications and                            allergies were reviewed. The patient's tolerance of                            previous anesthesia was also reviewed. The risks                            and benefits of the procedure and the sedation                            options and risks were discussed with the patient.                            All questions were answered, and informed consent                            was obtained. Prior Anticoagulants: The patient has  taken Eliquis (apixaban), last dose was 1 day prior                            to procedure. ASA Grade Assessment: III - A patient                            with severe systemic disease. After reviewing the                            risks and benefits, the patient was deemed in                            satisfactory condition to undergo the procedure.                           After obtaining informed consent, the colonoscope                             was passed under direct vision. Throughout the                            procedure, the patient's blood pressure, pulse, and                            oxygen saturations were monitored continuously. The                            Colonoscope was introduced through the anus and                            advanced to the 2 cm into the ileum. The                            colonoscopy was performed without difficulty. The                            patient tolerated the procedure well. The quality                            of the bowel preparation was good. The terminal                            ileum, ileocecal valve, appendiceal orifice, and                            rectum were photographed. Scope In: 11:01:56 AM Scope Out: 11:11:11 AM Scope Withdrawal Time: 0 hours 6 minutes 9 seconds  Total Procedure Duration: 0 hours 9 minutes 15 seconds  Findings:                 A single medium-sized localized angiodysplastic                            lesion without bleeding was found in the  proximal                            ascending colon. There was also mild melanosis coli                            throughout the colon.                           A few small-mouthed diverticula were found in the                            neo-sigmoid colon.                           There was evidence of a prior end-to-end                            colo-colonic anastomosis in the sigmoid colon, 15                            cm from the anal verge. This was patent and was                            characterized by healthy appearing mucosa. The                            anastomosis was traversed. No recurrence.                           Non-bleeding internal hemorrhoids were found during                            retroflexion. The hemorrhoids were small.                           The terminal ileum appeared normal. Complications:            No immediate complications. Estimated Blood Loss:      Estimated blood loss: none. Impression:               - A single non-bleeding colonic AVM.                           - Diverticulosis in the neo-sigmoid colon.                           - Patent end-to-end colo-colonic anastomosis,                            characterized by healthy appearing mucosa. No                            recurrence of colon cancer.                           - Non-bleeding internal hemorrhoids.                           -  The examined portion of the ileum was normal.                           - No specimens collected. Recommendation:           - Patient has a contact number available for                            emergencies. The signs and symptoms of potential                            delayed complications were discussed with the                            patient. Return to normal activities tomorrow.                            Written discharge instructions were provided to the                            patient.                           - Resume previous diet.                           - Continue present medications.                           - Resume Eliquis in a.m.                           - Repeat colonoscopy in 3 years for surveillance,                            if clinically feasible. Certainly earlier if with                            problems. If he continues to be anemic with heme                            positive stools, would consider colonoscopy with                            AVM ablation at [facility]                           - Return to GI clinic in 12 weeks.                           - Check CBC, CMP today. Jackquline Denmark, MD 08/19/2020 11:26:22 AM This report has been signed electronically.

## 2020-08-19 NOTE — Progress Notes (Signed)
Called to room to assist during endoscopic procedure.  Patient ID and intended procedure confirmed with present staff. Received instructions for my participation in the procedure from the performing physician.  

## 2020-08-19 NOTE — Patient Instructions (Addendum)
HANDOUTS PROVIDED ON: DIVERTICULOSIS & HEMORRHOIDS  The biopsies taken today have been sent for pathology.  The results can take 1-3 weeks to receive.    You may resume your previous diet.  You may resume your normal medication schedule EXCEPT Eliquis which you may resume tomorrow Tuesday, August 20, 2020.  Thank you for allowing [place] to care for you today!!!   YOU HAD AN ENDOSCOPIC PROCEDURE TODAY AT [facility]:   Refer to the procedure report that was given to you for any specific questions about what was found during the examination.  If the procedure report does not answer your questions, please call your gastroenterologist to clarify.  If you requested that your care partner not be given the details of your procedure findings, then the procedure report has been included in a sealed envelope for you to review at your convenience later.  YOU SHOULD EXPECT: Some feelings of bloating in the abdomen. Passage of more gas than usual.  Walking can help get rid of the air that was put into your GI tract during the procedure and reduce the bloating. If you had a lower endoscopy (such as a colonoscopy or flexible sigmoidoscopy) you may notice spotting of blood in your stool or on the toilet paper. If you underwent a bowel prep for your procedure, you may not have a normal bowel movement for a few days.  Please Note:  You might notice some irritation and congestion in your nose or some drainage.  This is from the oxygen used during your procedure.  There is no need for concern and it should clear up in a day or so.  SYMPTOMS TO REPORT IMMEDIATELY:   Following lower endoscopy (colonoscopy or flexible sigmoidoscopy):  Excessive amounts of blood in the stool  Significant tenderness or worsening of abdominal pains  Swelling of the abdomen that is new, acute  Fever of 100F or higher   Following upper endoscopy (EGD)  Vomiting of blood or coffee ground material  New chest pain or  pain under the shoulder blades  Painful or persistently difficult swallowing  New shortness of breath  Fever of 100F or higher  Black, tarry-looking stools  For urgent or emergent issues, a gastroenterologist can be reached at any hour by calling [phone]. Do not use MyChart messaging for urgent concerns.    DIET:  We do recommend a small meal at first, but then you may proceed to your regular diet.  Drink plenty of fluids but you should avoid alcoholic beverages for 24 hours.  ACTIVITY:  You should plan to take it easy for the rest of today and you should NOT DRIVE or use heavy machinery until tomorrow (because of the sedation medicines used during the test).    FOLLOW UP: Our staff will call the number listed on your records 48-72 hours following your procedure to check on you and address any questions or concerns that you may have regarding the information given to you following your procedure. If we do not reach you, we will leave a message.  We will attempt to reach you two times.  During this call, we will ask if you have developed any symptoms of COVID 19. If you develop any symptoms (ie: fever, flu-like symptoms, shortness of breath, cough etc.) before then, please call [phone].  If you test positive for Covid 19 in the 2 weeks post procedure, please call and report this information to [place].    If any biopsies were taken  you will be contacted by phone or by letter within the next 1-3 weeks.  Please call us at [phone] if you have not heard about the biopsies in 3 weeks.    SIGNATURES/CONFIDENTIALITY: You and/or your care partner have signed paperwork which will be entered into your electronic medical record.  These signatures attest to the fact that that the information above on your After Visit Summary has been reviewed and is understood.  Full responsibility of the confidentiality of this discharge information lies with you and/or your care-partner.

## 2020-08-20 ENCOUNTER — Ambulatory Visit: Payer: PPO | Admitting: Gastroenterology

## 2020-08-21 ENCOUNTER — Encounter: Payer: Self-pay | Admitting: Hematology & Oncology

## 2020-08-21 ENCOUNTER — Telehealth: Payer: Self-pay

## 2020-08-21 ENCOUNTER — Inpatient Hospital Stay: Payer: PPO

## 2020-08-21 ENCOUNTER — Other Ambulatory Visit: Payer: Self-pay

## 2020-08-21 ENCOUNTER — Inpatient Hospital Stay (HOSPITAL_BASED_OUTPATIENT_CLINIC_OR_DEPARTMENT_OTHER): Payer: PPO | Admitting: Hematology & Oncology

## 2020-08-21 ENCOUNTER — Telehealth: Payer: Self-pay | Admitting: *Deleted

## 2020-08-21 ENCOUNTER — Inpatient Hospital Stay: Payer: PPO | Attending: Hematology & Oncology

## 2020-08-21 VITALS — BP 165/55 | HR 53 | Temp 97.8°F | Resp 16 | Wt 164.0 lb

## 2020-08-21 DIAGNOSIS — N289 Disorder of kidney and ureter, unspecified: Secondary | ICD-10-CM | POA: Diagnosis not present

## 2020-08-21 DIAGNOSIS — D509 Iron deficiency anemia, unspecified: Secondary | ICD-10-CM | POA: Insufficient documentation

## 2020-08-21 DIAGNOSIS — C187 Malignant neoplasm of sigmoid colon: Secondary | ICD-10-CM

## 2020-08-21 DIAGNOSIS — Z85038 Personal history of other malignant neoplasm of large intestine: Secondary | ICD-10-CM | POA: Diagnosis not present

## 2020-08-21 DIAGNOSIS — Z79899 Other long term (current) drug therapy: Secondary | ICD-10-CM | POA: Insufficient documentation

## 2020-08-21 DIAGNOSIS — Z7901 Long term (current) use of anticoagulants: Secondary | ICD-10-CM | POA: Diagnosis not present

## 2020-08-21 DIAGNOSIS — I480 Paroxysmal atrial fibrillation: Secondary | ICD-10-CM | POA: Insufficient documentation

## 2020-08-21 DIAGNOSIS — D500 Iron deficiency anemia secondary to blood loss (chronic): Secondary | ICD-10-CM

## 2020-08-21 DIAGNOSIS — D631 Anemia in chronic kidney disease: Secondary | ICD-10-CM | POA: Diagnosis not present

## 2020-08-21 LAB — IRON AND TIBC
Iron: 135 ug/dL (ref 42–163)
Saturation Ratios: 40 % (ref 20–55)
TIBC: 335 ug/dL (ref 202–409)
UIBC: 200 ug/dL (ref 117–376)

## 2020-08-21 LAB — CBC WITH DIFFERENTIAL (CANCER CENTER ONLY)
Abs Immature Granulocytes: 0.02 10*3/uL (ref 0.00–0.07)
Basophils Absolute: 0 10*3/uL (ref 0.0–0.1)
Basophils Relative: 1 %
Eosinophils Absolute: 0.1 10*3/uL (ref 0.0–0.5)
Eosinophils Relative: 2 %
HCT: 34 % — ABNORMAL LOW (ref 39.0–52.0)
Hemoglobin: 10.8 g/dL — ABNORMAL LOW (ref 13.0–17.0)
Immature Granulocytes: 1 %
Lymphocytes Relative: 29 %
Lymphs Abs: 1.3 10*3/uL (ref 0.7–4.0)
MCH: 33.6 pg (ref 26.0–34.0)
MCHC: 31.8 g/dL (ref 30.0–36.0)
MCV: 105.9 fL — ABNORMAL HIGH (ref 80.0–100.0)
Monocytes Absolute: 0.5 10*3/uL (ref 0.1–1.0)
Monocytes Relative: 10 %
Neutro Abs: 2.5 10*3/uL (ref 1.7–7.7)
Neutrophils Relative %: 57 %
Platelet Count: 181 10*3/uL (ref 150–400)
RBC: 3.21 MIL/uL — ABNORMAL LOW (ref 4.22–5.81)
RDW: 15 % (ref 11.5–15.5)
WBC Count: 4.4 10*3/uL (ref 4.0–10.5)
nRBC: 0 % (ref 0.0–0.2)

## 2020-08-21 LAB — CMP (CANCER CENTER ONLY)
ALT: 10 U/L (ref 0–44)
AST: 13 U/L — ABNORMAL LOW (ref 15–41)
Albumin: 4.4 g/dL (ref 3.5–5.0)
Alkaline Phosphatase: 92 U/L (ref 38–126)
Anion gap: 6 (ref 5–15)
BUN: 33 mg/dL — ABNORMAL HIGH (ref 8–23)
CO2: 26 mmol/L (ref 22–32)
Calcium: 10.2 mg/dL (ref 8.9–10.3)
Chloride: 107 mmol/L (ref 98–111)
Creatinine: 2.07 mg/dL — ABNORMAL HIGH (ref 0.61–1.24)
GFR, Estimated: 30 mL/min — ABNORMAL LOW (ref >60)
Glucose, Bld: 103 mg/dL — ABNORMAL HIGH (ref 70–99)
Potassium: 6.1 mmol/L (ref 3.5–5.1)
Sodium: 139 mmol/L (ref 135–145)
Total Bilirubin: 0.6 mg/dL (ref 0.3–1.2)
Total Protein: 6.9 g/dL (ref 6.5–8.1)

## 2020-08-21 LAB — RETICULOCYTES
Immature Retic Fract: 6.6 % (ref 2.3–15.9)
RBC.: 3.3 MIL/uL — ABNORMAL LOW (ref 4.22–5.81)
Retic Count, Absolute: 62 10*3/uL (ref 19.0–186.0)
Retic Ct Pct: 1.9 % (ref 0.4–3.1)

## 2020-08-21 LAB — FERRITIN: Ferritin: 87 ng/mL (ref 24–336)

## 2020-08-21 NOTE — Progress Notes (Signed)
Hematology and Oncology Follow Up Visit  Roberto Fields [id number] 07-10-1946 74 y.o. 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  He has seen Dr. Lyndel Safe of gastroenterology.  He did have a procedure 2 days ago.  Had upper endoscopy and lower endoscopy.  There was some polyps in the stomach.  He has some vascular ectasia in the stomach.  He has some angiodysplasia in the colon.  He looks good.  He feels better.  Clearly, the Aranesp has been the key for [place] to get his hemoglobin up.  He is not noted any melena or bright red blood per rectum.  He has had no nausea or vomiting.  He is eating well.  Para he has had a little bit of swelling in the legs.  This might be from the Norvasc that he takes.  He has had no fever.  He and his wife have been vaccinated.  Overall, his performance status is ECOG 1.    Medications:  Current Outpatient Medications:  .  Acetaminophen (TYLENOL PO), Take by mouth., Disp: , Rfl:  .  amLODipine (NORVASC) 5 MG tablet, Take 1 tablet by mouth once daily, Disp: 90 tablet, Rfl: 0 .  Docusate Calcium (STOOL SOFTENER PO), Take by mouth as needed. , Disp: , Rfl:  .  ELIQUIS 5 MG TABS tablet, Take 1 tablet by mouth twice daily, Disp: 180 tablet, Rfl: 1 .  Ferrous Sulfate (IRON SUPPLEMENT PO), Take 1 tablet by mouth daily., Disp: , Rfl:  .  gabapentin (NEURONTIN) 300 MG capsule, Take 600 mg by mouth 2 (two) times daily. , Disp: , Rfl:  .  Latanoprost (XELPROS) 0.005 % EMUL, Apply 1 drop to eye at bedtime., Disp: , Rfl:  .  levothyroxine  (EUTHYROX) 88 MCG tablet, Take 1 tablet (88 mcg total) by mouth daily., Disp: 90 tablet, Rfl: 1 .  meclizine (ANTIVERT) 25 MG tablet, TAKE 1 TABLET BY MOUTH THREE TIMES DAILY AS NEEDED FOR DIZZINESS (Patient not taking: Reported on 08/19/2020), Disp: 90 tablet, Rfl: 0 .  niacin (NIASPAN) 500 MG CR tablet, Take 500 mg by mouth daily. , Disp: , Rfl:  .  nitroGLYCERIN (NITROSTAT) 0.4 MG SL tablet, DISSOLVE ONE TABLET UNDER THE TONGUE EVERY 5 MINUTES AS NEEDED FOR CHEST PAIN (Patient not taking: Reported on 07/17/2020), Disp: 100 tablet, Rfl: prn .  omeprazole (PRILOSEC) 40 MG capsule, Take 1 capsule by mouth once daily, Disp: 90 capsule, Rfl: 3 .  REFRESH CELLUVISC 1 % GEL, Apply 1 drop to eye 3 (three) times daily. , Disp: , Rfl:  .  telmisartan (MICARDIS) 80 MG tablet, Take 1/2 (one-half) tablet by mouth once daily, Disp: 45 tablet, Rfl: 1 .  timolol (TIMOPTIC) 0.5 % ophthalmic solution, 1 drop daily. , Disp: , Rfl:  .  vitamin B-12 (CYANOCOBALAMIN) 1000 MCG tablet, Take 1 tablet by mouth daily., Disp: , Rfl:   Allergies:  Allergies  Allergen Reactions  . Atorvastatin Other (See Comments)    REACTION: Muscle weakness  . Nsaids Other (See Comments)    Avoid with current  kidney function.    . Colesevelam Other (See Comments)    REACTION: joint pain  . Pravastatin Other (See Comments)    Muscle aches  . Simvastatin Other (See Comments)    REACTION: Myalgias    Past Medical History, Surgical history, Social history, and Family History were reviewed and updated.  Review of Systems: Review of Systems  Constitutional: Positive for fatigue.  HENT:  Negative.   Eyes: Negative.   Respiratory: Negative.   Cardiovascular: Positive for palpitations.  Gastrointestinal: Positive for blood in stool.  Endocrine: Negative.   Genitourinary: Negative.    Musculoskeletal: Negative.   Skin: Negative.   Neurological: Negative.   Hematological: Negative.   Psychiatric/Behavioral: Negative.      Physical Exam:  weight is 164 lb (74.4 kg). His oral temperature is 97.8 F (36.6 C). His blood pressure is 165/55 (abnormal) and his pulse is 53 (abnormal). His respiration is 16 and oxygen saturation is 100%.   Wt Readings from Last 3 Encounters:  08/21/20 164 lb (74.4 kg)  08/19/20 160 lb (72.6 kg)  07/29/20 163 lb (73.9 kg)    Physical Exam Vitals reviewed.  HENT:     Head: Normocephalic and atraumatic.  Eyes:     Pupils: Pupils are equal, round, and reactive to light.  Cardiovascular:     Rate and Rhythm: Normal rate and regular rhythm.     Heart sounds: Normal heart sounds.  Pulmonary:     Effort: Pulmonary effort is normal.     Breath sounds: Normal breath sounds.  Abdominal:     General: Bowel sounds are normal.     Palpations: Abdomen is soft.  Musculoskeletal:        General: No tenderness or deformity. Normal range of motion.     Cervical back: Normal range of motion.  Lymphadenopathy:     Cervical: No cervical adenopathy.  Skin:    General: Skin is warm and dry.     Findings: No erythema or rash.  Neurological:     Mental Status: He is alert and oriented to person, place, and time.  Psychiatric:        Behavior: Behavior normal.        Thought Content: Thought content normal.        Judgment: Judgment normal.      Lab Results  Component Value Date   WBC 4.4 08/21/2020   HGB 10.8 (L) 08/21/2020   HCT 34.0 (L) 08/21/2020   MCV 105.9 (H) 08/21/2020   PLT 181 08/21/2020     Chemistry      Component Value Date/Time   NA 139 08/21/2020 0855   NA 142 01/05/2017 0000   K 6.1 (HH) 08/21/2020 0855   CL 107 08/21/2020 0855   CO2 26 08/21/2020 0855   BUN 33 (H) 08/21/2020 0855   BUN 24 (A) 01/05/2017 0000   CREATININE 2.07 (H) 08/21/2020 0855   CREATININE 2.00 (H) 06/18/2020 0952   GLU 95 01/05/2017 0000      Component Value Date/Time   CALCIUM 10.2 08/21/2020 0855   CALCIUM 9.5 09/19/2014 0000   ALKPHOS 92 08/21/2020 0855   AST 13 (L)  08/21/2020 0855   ALT 10 08/21/2020 0855   BILITOT 0.6 08/21/2020 0855      Impression and Plan: Roberto Fields is a very nice 74 year old white male.  He has multifactorial anemia.   I noticed that his potassium was on the high side today.  I am not too sure as to why  it is on the higher side.  Not on any potassium.  I will have to get him back probably in a couple days to double check his metabolic levels.  We do not have to see him back for his anemia probably until Thanksgiving.  I would like to get him back before the holiday so we can make sure that his blood count does not drop any that he would not be fatigued over the holiday season.    [name], MD 10/13/20219:40 AM

## 2020-08-21 NOTE — Telephone Encounter (Signed)
  Follow up Call-  Call back number 08/19/2020 08/28/2019  Post procedure Call Back phone  # [phone]  Permission to leave phone message Yes Yes  Some recent data might be hidden     1st follow up call made.  NA line busy.

## 2020-08-21 NOTE — Telephone Encounter (Signed)
  Follow up Call-  Call back number 08/19/2020 08/28/2019  Post procedure Call Back phone  # [phone]  Permission to leave phone message Yes Yes  Some recent data might be hidden     Patient questions:  Do you have a fever, pain , or abdominal swelling? No. Pain Score  0 *  Have you tolerated food without any problems? Yes.    Have you been able to return to your normal activities? Yes.    Do you have any questions about your discharge instructions: Diet   No. Medications  No. Follow up visit  No.  Do you have questions or concerns about your Care? No.  Actions: * If pain score is 4 or above: No action needed, pain <4. 1. Have you developed a fever since your procedure? no  2.   Have you had an respiratory symptoms (SOB or cough) since your procedure? no  3.   Have you tested positive for COVID 19 since your procedure no  4.   Have you had any family members/close contacts diagnosed with the COVID 19 since your procedure?  no   If yes to any of these questions please route to Joylene John, RN and Joella Prince, RN

## 2020-08-21 NOTE — Telephone Encounter (Signed)
Critical potassium reported at 6.1.  Dr Marin Olp seeing patient in exam room at present.  Dr Marin Olp aware.  No orders received.

## 2020-08-23 ENCOUNTER — Telehealth: Payer: Self-pay | Admitting: *Deleted

## 2020-08-23 ENCOUNTER — Inpatient Hospital Stay: Payer: PPO

## 2020-08-23 ENCOUNTER — Other Ambulatory Visit: Payer: Self-pay

## 2020-08-23 DIAGNOSIS — N289 Disorder of kidney and ureter, unspecified: Secondary | ICD-10-CM | POA: Diagnosis not present

## 2020-08-23 DIAGNOSIS — C187 Malignant neoplasm of sigmoid colon: Secondary | ICD-10-CM

## 2020-08-23 LAB — BASIC METABOLIC PANEL - CANCER CENTER ONLY
Anion gap: 7 (ref 5–15)
BUN: 36 mg/dL — ABNORMAL HIGH (ref 8–23)
CO2: 25 mmol/L (ref 22–32)
Calcium: 9.7 mg/dL (ref 8.9–10.3)
Chloride: 107 mmol/L (ref 98–111)
Creatinine: 2.21 mg/dL — ABNORMAL HIGH (ref 0.61–1.24)
GFR, Estimated: 28 mL/min — ABNORMAL LOW (ref >60)
Glucose, Bld: 94 mg/dL (ref 70–99)
Potassium: 5.7 mmol/L — ABNORMAL HIGH (ref 3.5–5.1)
Sodium: 139 mmol/L (ref 135–145)

## 2020-08-23 NOTE — Telephone Encounter (Signed)
Message left to notify pt per order of Dr. Marin Olp that potassium is 5.7 and to return as scheduled on 10/02/20.  Instructed pt to call office back with any questions or concerns.

## 2020-08-25 ENCOUNTER — Encounter: Payer: Self-pay | Admitting: Gastroenterology

## 2020-08-26 ENCOUNTER — Encounter (HOSPITAL_COMMUNITY): Payer: PPO

## 2020-08-26 ENCOUNTER — Encounter: Payer: PPO | Admitting: Surgery

## 2020-08-31 ENCOUNTER — Other Ambulatory Visit: Payer: Self-pay | Admitting: Family Medicine

## 2020-08-31 DIAGNOSIS — E039 Hypothyroidism, unspecified: Secondary | ICD-10-CM

## 2020-09-03 ENCOUNTER — Encounter: Payer: Self-pay | Admitting: Family Medicine

## 2020-09-03 ENCOUNTER — Other Ambulatory Visit: Payer: Self-pay

## 2020-09-03 ENCOUNTER — Telehealth: Payer: Self-pay | Admitting: Family Medicine

## 2020-09-03 ENCOUNTER — Ambulatory Visit (INDEPENDENT_AMBULATORY_CARE_PROVIDER_SITE_OTHER): Payer: PPO | Admitting: Family Medicine

## 2020-09-03 VITALS — BP 148/43 | HR 50 | Ht 68.0 in | Wt 166.0 lb

## 2020-09-03 DIAGNOSIS — I2510 Atherosclerotic heart disease of native coronary artery without angina pectoris: Secondary | ICD-10-CM | POA: Diagnosis not present

## 2020-09-03 DIAGNOSIS — A6920 Lyme disease, unspecified: Secondary | ICD-10-CM | POA: Diagnosis not present

## 2020-09-03 DIAGNOSIS — M79674 Pain in right toe(s): Secondary | ICD-10-CM

## 2020-09-03 DIAGNOSIS — Z23 Encounter for immunization: Secondary | ICD-10-CM

## 2020-09-03 DIAGNOSIS — H16223 Keratoconjunctivitis sicca, not specified as Sjogren's, bilateral: Secondary | ICD-10-CM | POA: Diagnosis not present

## 2020-09-03 DIAGNOSIS — H401133 Primary open-angle glaucoma, bilateral, severe stage: Secondary | ICD-10-CM | POA: Diagnosis not present

## 2020-09-03 DIAGNOSIS — I700 Atherosclerosis of aorta: Secondary | ICD-10-CM | POA: Diagnosis not present

## 2020-09-03 DIAGNOSIS — I10 Essential (primary) hypertension: Secondary | ICD-10-CM | POA: Diagnosis not present

## 2020-09-03 DIAGNOSIS — E039 Hypothyroidism, unspecified: Secondary | ICD-10-CM | POA: Diagnosis not present

## 2020-09-03 NOTE — Telephone Encounter (Addendum)
Call patient's wife and let her know that I am going to try increasing his amlodipine to 10 mg it can increase his risk for swelling so she will need to monitor for that.  If he starts swelling again in that leg and she can go back down to 5 mg but I had like for [place] to try it.  We really cannot change the 10 losartan because of his kidney function.  If we cannot use the higher dose of the amlodipine then again just go back down to 5 mg and let me know and we will add something different.  Also see if he would be willing to try a statin just 2 nights a week.  I know he has tried several in the past as well.  But I want to see if he might be able to tolerate a low dose which would still reduce his risk since he does have atherosclerosis.    Also please let her know that I did send in a low-dose diuretic for her as well to help lower her blood pressure.  Encouraged them to cut back on salt intake.

## 2020-09-03 NOTE — Assessment & Plan Note (Signed)
He has tried multiple statins including atorvastatin, pravastatin and simvastatin.  Consider a trial of a low-dose statin to be taken just twice a week at bedtime to see if at least he is able to tolerate that.

## 2020-09-03 NOTE — Progress Notes (Signed)
Established Patient Office Visit  Subjective:  Patient ID: Roberto Fields, male    DOB: 1946/10/10  Age: 74 y.o. MRN: [id number]  CC:  Chief Complaint  Patient presents with  . Hypertension    HPI Roberto Fields presents for   Hypertension- Pt denies chest pain, SOB, dizziness, or heart palpitations.  Taking meds as directed w/o problems.  Denies medication side effects.    His wife wants me to take a look at his right lower leg a few weeks ago he had some new onset swelling.  Dopplers were negative for any type of DVT and after he received his blood transfusion for his anemia it actually seemed to improve on its own.  He does take amlodipine 5 mg daily.  He also noticed a skin coloration change just above his ankle anteriorly he has a similar lesion just below the knee on the outer lower leg but this that that is been there for a long time.  But the area just above his ankles newer he also reports that the great toe on that left foot has also been sore painful and red for several weeks.  No prior history of gout.  F/U CAD -no recent chest pain.  Not currently on a statin.  He is on Eliquis and unfortunately is in his Medicare gap so it has been quite expensive but his wife says that they should not have to refill it again before the end of the year.  Hypothyroidism - Taking medication regularly in the AM away from food and vitamins, etc. No recent change to skin, hair, or energy levels.   Past Medical History:  Diagnosis Date  . Arthritis   . Atrial fibrillation ([place])   . Blood transfusion without reported diagnosis   . BPH (benign prostatic hyperplasia)   . CAD (coronary artery disease)   . Cancer of sigmoid colon ([place]) 06/27/2020  . Cataract    bilateral cataracts removed  . COPD (chronic obstructive pulmonary disease) ([place])   . CVA (cerebral infarction)   . Dry eye   . 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 Glaucoma   . History of blood clots   . History of colon  polyps   . Hyperlipidemia   . Hypertension   . Hypothyroid   . Iron deficiency anemia due to chronic blood loss 06/27/2020  . Myocardial infarction ([place]) 2010  . Renal insufficiency    stage 4   . Stomach ulcer   . Stroke ([place]) 2010  . Tuberculosis    at age 46  . Ulcer 1992   HX peptic ulcer- DX: by endoscopy    Past Surgical History:  Procedure Laterality Date  . arm surgery    . COLONOSCOPY     Dr Lavina Hamman in [place]. Dr Unk Lightning removed a larger polyp  . CORONARY STENT PLACEMENT  04/2011   [facility], [facility]  . ESOPHAGOGASTRODUODENOSCOPY     dr Lavina Hamman in [place]  . EYE SURGERY    . HERNIA MESH REMOVAL    . [place]  . TRANSURETHRAL RESECTION OF PROSTATE      Family History  Problem Relation Age of Onset  . Heart disease Father   . Heart attack Father   . Coronary artery disease Brother   . Heart attack Brother 36       pacemaker and defibrillator  . Coronary artery disease Brother   . Leukemia Mother   . Hypertension Other  family history of  . Heart disease Sister   . Stomach cancer Sister   . Breast cancer Sister   . Lung cancer Sister   . Breast cancer Sister   . Lung cancer Sister   . Colon cancer Neg Hx   . Esophageal cancer Neg Hx   . Rectal cancer Neg Hx     Social History   Socioeconomic History  . Marital status: Married    Spouse name: Not on file  . Number of children: 3  . Years of education: Not on file  . Highest education level: Not on file  Occupational History  . Occupation: Retired  Tobacco Use  . Smoking status: Former Research scientist (life sciences)  . Smokeless tobacco: Never Used  Vaping Use  . Vaping Use: Never used  Substance and Sexual Activity  . Alcohol use: No  . Drug use: No  . Sexual activity: Not on file  Other Topics Concern  . Not on file  Social History Narrative  . Not on file   Social Determinants of Health   Financial Resource Strain:   . Difficulty of Paying Living Expenses:  Not on file  Food Insecurity:   . Worried About Charity fundraiser in the Last Year: Not on file  . Ran Out of Food in the Last Year: Not on file  Transportation Needs:   . Lack of Transportation (Medical): Not on file  . Lack of Transportation (Non-Medical): Not on file  Physical Activity:   . Days of Exercise per Week: Not on file  . Minutes of Exercise per Session: Not on file  Stress:   . Feeling of Stress : Not on file  Social Connections:   . Frequency of Communication with Friends and Family: Not on file  . Frequency of Social Gatherings with Friends and Family: Not on file  . Attends Religious Services: Not on file  . Active Member of Clubs or Organizations: Not on file  . Attends Archivist Meetings: Not on file  . Marital Status: Not on file  Intimate Partner Violence:   . Fear of Current or Ex-Partner: Not on file  . Emotionally Abused: Not on file  . Physically Abused: Not on file  . Sexually Abused: Not on file    Outpatient Medications Prior to Visit  Medication Sig Dispense Refill  . Acetaminophen (TYLENOL PO) Take by mouth.    Marland Kitchen amLODipine (NORVASC) 5 MG tablet Take 1 tablet by mouth once daily 90 tablet 0  . Docusate Calcium (STOOL SOFTENER PO) Take by mouth as needed.     Marland Kitchen ELIQUIS 5 MG TABS tablet Take 1 tablet by mouth twice daily 180 tablet 1  . Ferrous Sulfate (IRON SUPPLEMENT PO) Take 1 tablet by mouth daily.    Marland Kitchen gabapentin (NEURONTIN) 300 MG capsule Take 600 mg by mouth 2 (two) times daily.     . Latanoprost (XELPROS) 0.005 % EMUL Apply 1 drop to eye at bedtime.    Marland Kitchen levothyroxine (EUTHYROX) 88 MCG tablet Take 1 tablet (88 mcg total) by mouth daily. 90 tablet 1  . niacin (NIASPAN) 500 MG CR tablet Take 500 mg by mouth daily.     . nitroGLYCERIN (NITROSTAT) 0.4 MG SL tablet DISSOLVE ONE TABLET UNDER THE TONGUE EVERY 5 MINUTES AS NEEDED FOR CHEST PAIN 100 tablet prn  . omeprazole (PRILOSEC) 40 MG capsule Take 1 capsule by mouth once daily 90  capsule 3  . REFRESH CELLUVISC 1 % GEL Apply 1 drop  to eye 3 (three) times daily.     Marland Kitchen telmisartan (MICARDIS) 80 MG tablet Take 1/2 (one-half) tablet by mouth once daily 45 tablet 1  . timolol (TIMOPTIC) 0.5 % ophthalmic solution 1 drop daily.     . vitamin B-12 (CYANOCOBALAMIN) 1000 MCG tablet Take 1 tablet by mouth daily.    . meclizine (ANTIVERT) 25 MG tablet TAKE 1 TABLET BY MOUTH THREE TIMES DAILY AS NEEDED FOR DIZZINESS 90 tablet 0   No facility-administered medications prior to visit.    Allergies  Allergen Reactions  . Atorvastatin Other (See Comments)    REACTION: Muscle weakness  . Nsaids Other (See Comments)    Avoid with current kidney function.    . Colesevelam Other (See Comments)    REACTION: joint pain  . Pravastatin Other (See Comments)    Muscle aches  . Simvastatin Other (See Comments)    REACTION: Myalgias    ROS Review of Systems    Objective:    Physical Exam Constitutional:      Appearance: He is well-developed.  HENT:     Head: Normocephalic and atraumatic.  Cardiovascular:     Rate and Rhythm: Normal rate and regular rhythm.     Heart sounds: Normal heart sounds.  Pulmonary:     Effort: Pulmonary effort is normal.     Breath sounds: Normal breath sounds.  Skin:    General: Skin is warm and dry.  Neurological:     Mental Status: He is alert and oriented to person, place, and time.  Psychiatric:        Behavior: Behavior normal.     BP (!) 148/43   Pulse (!) 50   Ht 5\' 8"  (1.727 m)   Wt 166 lb (75.3 kg)   SpO2 97%   BMI 25.24 kg/m  Wt Readings from Last 3 Encounters:  09/03/20 166 lb (75.3 kg)  08/21/20 164 lb (74.4 kg)  08/19/20 160 lb (72.6 kg)     There are no preventive care reminders to display for this patient.  There are no preventive care reminders to display for this patient.  Lab Results  Component Value Date   TSH 2.05 06/18/2020   Lab Results  Component Value Date   WBC 4.4 08/21/2020   HGB 10.8 (L)  08/21/2020   HCT 34.0 (L) 08/21/2020   MCV 105.9 (H) 08/21/2020   PLT 181 08/21/2020   Lab Results  Component Value Date   NA 139 08/23/2020   K 5.7 (H) 08/23/2020   CO2 25 08/23/2020   GLUCOSE 94 08/23/2020   BUN 36 (H) 08/23/2020   CREATININE 2.21 (H) 08/23/2020   BILITOT 0.6 08/21/2020   ALKPHOS 92 08/21/2020   AST 13 (L) 08/21/2020   ALT 10 08/21/2020   PROT 6.9 08/21/2020   ALBUMIN 4.4 08/21/2020   CALCIUM 9.7 08/23/2020   ANIONGAP 7 08/23/2020   GFR 36.14 (L) 08/19/2020   Lab Results  Component Value Date   CHOL 187 05/27/2020   Lab Results  Component Value Date   HDL 39 (L) 05/27/2020   Lab Results  Component Value Date   LDLCALC 123 (H) 05/27/2020   Lab Results  Component Value Date   TRIG 130 05/27/2020   Lab Results  Component Value Date   CHOLHDL 4.8 05/27/2020   Lab Results  Component Value Date   HGBA1C 5.5 08/26/2015      Assessment & Plan:   Problem List Items Addressed This Visit  Cardiovascular and Mediastinum   HYPERTENSION, BENIGN ESSENTIAL    Blood pressure still not well controlled.  Is not taking any anti-inflammatories, no recent medication changes.  He does liberalize his salt so we discussed cutting back on that a little bit.  We can try increase amlodipine to 10 mg for couple weeks but will need to monitor to make sure he does not notice any increased lower extremity swelling he recently had a bout of swelling particularly in his right lower leg but after he received a blood transfusion it actually seemed to get better he now just has some trace swelling.  Do not want to increase his to losartan because of his renal function.  And his BUN is typically elevated so we will hold off on adding a diuretic.      CAD (coronary artery disease)   Aortic atherosclerosis ([place])    He has tried multiple statins including atorvastatin, pravastatin and simvastatin.  Consider a trial of a low-dose statin to be taken just twice a week at  bedtime to see if at least he is able to tolerate that.        Endocrine   Hypothyroidism    Recheck tSH and adjust if needed.        Other Visit Diagnoses    Need for immunization against influenza    -  Primary   Relevant Orders   Flu Vaccine QUAD High Dose(Flu[facility]) (Completed)   Joint pain and swelling due to Lyme disease       Relevant Orders   Uric acid   COMPLETE METABOLIC PANEL WITH GFR   Sedimentation rate   C-reactive protein   ANA   Great toe pain, right         Right great toe pain-it is mildly erythematous and feels warm to touch.  No significant swelling on exam.  Possible gout we will do additional blood work for work-up.  Rash on lower leg most consistent with venous stasis dermatitis.  But again we will check a sed rate just to rule out other causes.  Recommended that he actually wear his compression stockings.  He has some at home but says they are hard to get on.  No orders of the defined types were placed in this encounter.   Follow-up: Return in about 6 months (around 03/04/2021).    Beatrice Lecher, MD

## 2020-09-03 NOTE — Telephone Encounter (Signed)
Left message regarding Dr. Gardiner Ramus recommendations and asked that she return call if there are any questions/concerns.

## 2020-09-03 NOTE — Assessment & Plan Note (Addendum)
Blood pressure still not well controlled.  Is not taking any anti-inflammatories, no recent medication changes.  He does liberalize his salt so we discussed cutting back on that a little bit.  We can try increase amlodipine to 10 mg for couple weeks but will need to monitor to make sure he does not notice any increased lower extremity swelling he recently had a bout of swelling particularly in his right lower leg but after he received a blood transfusion it actually seemed to get better he now just has some trace swelling.  Do not want to increase his to losartan because of his renal function.  And his BUN is typically elevated so we will hold off on adding a diuretic.

## 2020-09-03 NOTE — Assessment & Plan Note (Signed)
Recheck tSH and adjust if needed.

## 2020-09-05 LAB — COMPLETE METABOLIC PANEL WITH GFR
AG Ratio: 1.7 (calc) (ref 1.0–2.5)
ALT: 9 U/L (ref 9–46)
AST: 12 U/L (ref 10–35)
Albumin: 4.3 g/dL (ref 3.6–5.1)
Alkaline phosphatase (APISO): 104 U/L (ref 35–144)
BUN/Creatinine Ratio: 16 (calc) (ref 6–22)
BUN: 26 mg/dL — ABNORMAL HIGH (ref 7–25)
CO2: 25 mmol/L (ref 20–32)
Calcium: 9.4 mg/dL (ref 8.6–10.3)
Chloride: 108 mmol/L (ref 98–110)
Creat: 1.66 mg/dL — ABNORMAL HIGH (ref 0.70–1.18)
GFR, Est African American: 46 mL/min/{1.73_m2} — ABNORMAL LOW (ref >=60)
GFR, Est Non African American: 39 mL/min/{1.73_m2} — ABNORMAL LOW (ref >=60)
Globulin: 2.5 g/dL (calc) (ref 1.9–3.7)
Glucose, Bld: 93 mg/dL (ref 65–99)
Potassium: 5.7 mmol/L — ABNORMAL HIGH (ref 3.5–5.3)
Sodium: 140 mmol/L (ref 135–146)
Total Bilirubin: 0.7 mg/dL (ref 0.2–1.2)
Total Protein: 6.8 g/dL (ref 6.1–8.1)

## 2020-09-05 LAB — ANA: Anti Nuclear Antibody (ANA): NEGATIVE

## 2020-09-05 LAB — C-REACTIVE PROTEIN: CRP: 4.4 mg/L

## 2020-09-05 LAB — URIC ACID: Uric Acid, Serum: 8.1 mg/dL — ABNORMAL HIGH (ref 4.0–8.0)

## 2020-09-05 LAB — SEDIMENTATION RATE: Sed Rate: 28 mm/h — ABNORMAL HIGH (ref 0–20)

## 2020-09-05 MED ORDER — TELMISARTAN 40 MG PO TABS
40.0000 mg | ORAL_TABLET | Freq: Every day | ORAL | 1 refills | Status: DC
Start: 2020-09-05 — End: 2020-09-24

## 2020-09-05 MED ORDER — COLCHICINE 0.6 MG PO TABS
ORAL_TABLET | ORAL | 0 refills | Status: DC
Start: 2020-09-05 — End: 2022-03-02

## 2020-09-05 NOTE — Addendum Note (Signed)
Addended by: Beatrice Lecher D on: 09/05/2020 02:52 PM   Modules accepted: Orders

## 2020-09-05 NOTE — Telephone Encounter (Signed)
Spoke w/pt's wife and advised her of recommendations. She stated that he is taking half of the Telmisartan 40 mg and that he doesn't take Losartan.   She was advised to continue the Telmisartan 40 mg, start the 10 mg of Amlodipine and that it would be ok for him to take the 20 mg of Simvastatin that she is taking 2 nights a week. Told her that Dr Madilyn Fireman will send new Rx for 40 mg Telmisartan so they don't need to cut the pill.  She did want Dr. Madilyn Fireman to know that when his leg was swelling before after he got his transfusion the swelling went down in his leg and that Dr. Marin Olp said that it may have come from him needing the transfusion.? And since his swelling went down there was no reason to do the vein study.  Lastly she stated that she would p/u the diuretic today.   Told her to call the office should anything arise before then. She voiced understanding and agreed.

## 2020-09-09 ENCOUNTER — Other Ambulatory Visit: Payer: Self-pay | Admitting: Family Medicine

## 2020-09-09 DIAGNOSIS — E039 Hypothyroidism, unspecified: Secondary | ICD-10-CM

## 2020-09-12 DIAGNOSIS — H35033 Hypertensive retinopathy, bilateral: Secondary | ICD-10-CM | POA: Diagnosis not present

## 2020-09-12 DIAGNOSIS — H35361 Drusen (degenerative) of macula, right eye: Secondary | ICD-10-CM | POA: Diagnosis not present

## 2020-09-12 DIAGNOSIS — H16142 Punctate keratitis, left eye: Secondary | ICD-10-CM | POA: Diagnosis not present

## 2020-09-12 DIAGNOSIS — H35373 Puckering of macula, bilateral: Secondary | ICD-10-CM | POA: Diagnosis not present

## 2020-09-16 DIAGNOSIS — N1832 Chronic kidney disease, stage 3b: Secondary | ICD-10-CM | POA: Diagnosis not present

## 2020-09-24 NOTE — Telephone Encounter (Signed)
[name] called with update on blood pressures. They have been 128/53, 135/64, 123/52, 120/56, and 148/59. She states patient is feeling good and denies any side effects from medication. No swelling.

## 2020-09-24 NOTE — Telephone Encounter (Signed)
Is she splitting 80 mg of Tamela Zartan or 40 mg of 10 losartan?  I just need to know if I need to refill the 40s or send over the 20s.

## 2020-09-24 NOTE — Addendum Note (Signed)
Addended by: Beatrice Lecher D on: 09/24/2020 05:08 PM   Modules accepted: Orders

## 2020-09-26 NOTE — Telephone Encounter (Signed)
Left msg to call back for clarification

## 2020-09-27 MED ORDER — TELMISARTAN 20 MG PO TABS
20.0000 mg | ORAL_TABLET | Freq: Every day | ORAL | 0 refills | Status: DC
Start: 2020-09-27 — End: 2020-12-30

## 2020-09-27 NOTE — Telephone Encounter (Signed)
Letcher takes Telmisartan 40 mg tablets and Amlodipine 10 mg tablets daily. He doesn't take Losartan. His blood pressure today was 122/57. He did have dizziness last night. She states he does get vertigo every now and then. She just felt like it was just his vertigo.

## 2020-09-27 NOTE — Addendum Note (Signed)
Addended by: Beatrice Lecher D on: 09/27/2020 06:09 PM   Modules accepted: Orders

## 2020-10-02 ENCOUNTER — Other Ambulatory Visit: Payer: Self-pay

## 2020-10-02 ENCOUNTER — Inpatient Hospital Stay: Payer: PPO | Attending: Hematology & Oncology | Admitting: Hematology & Oncology

## 2020-10-02 ENCOUNTER — Inpatient Hospital Stay: Payer: PPO

## 2020-10-02 ENCOUNTER — Telehealth: Payer: Self-pay | Admitting: Hematology & Oncology

## 2020-10-02 ENCOUNTER — Encounter: Payer: Self-pay | Admitting: Hematology & Oncology

## 2020-10-02 VITALS — BP 137/39 | HR 53 | Temp 98.1°F | Resp 20 | Wt 165.1 lb

## 2020-10-02 DIAGNOSIS — C187 Malignant neoplasm of sigmoid colon: Secondary | ICD-10-CM

## 2020-10-02 DIAGNOSIS — Z7901 Long term (current) use of anticoagulants: Secondary | ICD-10-CM | POA: Diagnosis not present

## 2020-10-02 DIAGNOSIS — D500 Iron deficiency anemia secondary to blood loss (chronic): Secondary | ICD-10-CM | POA: Diagnosis not present

## 2020-10-02 DIAGNOSIS — N289 Disorder of kidney and ureter, unspecified: Secondary | ICD-10-CM | POA: Diagnosis not present

## 2020-10-02 DIAGNOSIS — Z79899 Other long term (current) drug therapy: Secondary | ICD-10-CM | POA: Insufficient documentation

## 2020-10-02 DIAGNOSIS — Z85038 Personal history of other malignant neoplasm of large intestine: Secondary | ICD-10-CM | POA: Diagnosis not present

## 2020-10-02 DIAGNOSIS — K922 Gastrointestinal hemorrhage, unspecified: Secondary | ICD-10-CM | POA: Diagnosis not present

## 2020-10-02 DIAGNOSIS — I480 Paroxysmal atrial fibrillation: Secondary | ICD-10-CM | POA: Insufficient documentation

## 2020-10-02 DIAGNOSIS — D631 Anemia in chronic kidney disease: Secondary | ICD-10-CM | POA: Diagnosis not present

## 2020-10-02 LAB — CBC WITH DIFFERENTIAL (CANCER CENTER ONLY)
Abs Immature Granulocytes: 0.02 10*3/uL (ref 0.00–0.07)
Basophils Absolute: 0 10*3/uL (ref 0.0–0.1)
Basophils Relative: 1 %
Eosinophils Absolute: 0.1 10*3/uL (ref 0.0–0.5)
Eosinophils Relative: 2 %
HCT: 29.1 % — ABNORMAL LOW (ref 39.0–52.0)
Hemoglobin: 9.2 g/dL — ABNORMAL LOW (ref 13.0–17.0)
Immature Granulocytes: 1 %
Lymphocytes Relative: 20 %
Lymphs Abs: 0.8 10*3/uL (ref 0.7–4.0)
MCH: 33.5 pg (ref 26.0–34.0)
MCHC: 31.6 g/dL (ref 30.0–36.0)
MCV: 105.8 fL — ABNORMAL HIGH (ref 80.0–100.0)
Monocytes Absolute: 0.4 10*3/uL (ref 0.1–1.0)
Monocytes Relative: 10 %
Neutro Abs: 2.8 10*3/uL (ref 1.7–7.7)
Neutrophils Relative %: 66 %
Platelet Count: 158 10*3/uL (ref 150–400)
RBC: 2.75 MIL/uL — ABNORMAL LOW (ref 4.22–5.81)
RDW: 13.9 % (ref 11.5–15.5)
WBC Count: 4.1 10*3/uL (ref 4.0–10.5)
nRBC: 0 % (ref 0.0–0.2)

## 2020-10-02 LAB — IRON AND TIBC
Iron: 130 ug/dL (ref 42–163)
Saturation Ratios: 41 % (ref 20–55)
TIBC: 319 ug/dL (ref 202–409)
UIBC: 189 ug/dL (ref 117–376)

## 2020-10-02 LAB — CMP (CANCER CENTER ONLY)
ALT: 8 U/L (ref 0–44)
AST: 10 U/L — ABNORMAL LOW (ref 15–41)
Albumin: 4.2 g/dL (ref 3.5–5.0)
Alkaline Phosphatase: 91 U/L (ref 38–126)
Anion gap: 6 (ref 5–15)
BUN: 28 mg/dL — ABNORMAL HIGH (ref 8–23)
CO2: 26 mmol/L (ref 22–32)
Calcium: 9.7 mg/dL (ref 8.9–10.3)
Chloride: 110 mmol/L (ref 98–111)
Creatinine: 1.79 mg/dL — ABNORMAL HIGH (ref 0.61–1.24)
GFR, Estimated: 39 mL/min — ABNORMAL LOW (ref >60)
Glucose, Bld: 103 mg/dL — ABNORMAL HIGH (ref 70–99)
Potassium: 5.3 mmol/L — ABNORMAL HIGH (ref 3.5–5.1)
Sodium: 142 mmol/L (ref 135–145)
Total Bilirubin: 0.5 mg/dL (ref 0.3–1.2)
Total Protein: 6.8 g/dL (ref 6.5–8.1)

## 2020-10-02 LAB — RETICULOCYTES
Immature Retic Fract: 11.1 % (ref 2.3–15.9)
RBC.: 2.7 MIL/uL — ABNORMAL LOW (ref 4.22–5.81)
Retic Count, Absolute: 73.7 10*3/uL (ref 19.0–186.0)
Retic Ct Pct: 2.7 % (ref 0.4–3.1)

## 2020-10-02 LAB — FERRITIN: Ferritin: 50 ng/mL (ref 24–336)

## 2020-10-02 MED ORDER — DARBEPOETIN ALFA 300 MCG/0.6ML IJ SOSY
300.0000 ug | PREFILLED_SYRINGE | Freq: Once | INTRAMUSCULAR | Status: AC
  Administered 2020-10-02: 300 ug via SUBCUTANEOUS

## 2020-10-02 NOTE — Telephone Encounter (Signed)
Appointments scheduled calendar printed per 11/24 los

## 2020-10-02 NOTE — Progress Notes (Signed)
Pt discharged in no apparent distress. Pt left ambulatory and stable without assistance. Pt aware of discharge instructions and verbalized understanding and had no further questions.

## 2020-10-02 NOTE — Progress Notes (Signed)
Hematology and Oncology Follow Up Visit  Roberto Fields [id number] March 03, 1946 74 y.o. 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 is in for follow-up.  He is managing all right.  He is gone for quite Thanksgiving with his family.  He has had a little bit of swelling in the right ankle.  This happens when his anemia goes down.  He has had no problems with bleeding.  There has been no melena or bright red blood per rectum.  He has had no problems with rashes.  There has been no nausea or vomiting.   His last iron studies back in October showed a ferritin of 87 with iron saturation 40%.    Currently, his performance status is ECOG 1.    Medications:  Current Outpatient Medications:  .  Acetaminophen (TYLENOL PO), Take by mouth daily as needed. , Disp: , Rfl:  .  amLODipine (NORVASC) 10 MG tablet, Take 10 mg by mouth daily., Disp: , Rfl:  .  ELIQUIS 5 MG TABS tablet, Take 1 tablet by mouth twice daily, Disp: 180 tablet, Rfl: 1 .  Ferrous Sulfate (IRON SUPPLEMENT PO), Take 1 tablet by mouth daily., Disp: , Rfl:  .  gabapentin (NEURONTIN) 300 MG capsule, Take 600 mg by mouth 2 (two) times daily. , Disp: , Rfl:  .  Latanoprost (XELPROS) 0.005 % EMUL, Apply 1 drop to eye at bedtime., Disp: , Rfl:  .  levothyroxine (EUTHYROX) 88 MCG tablet, Take 1 tablet (88 mcg total) by mouth daily., Disp: 90 tablet, Rfl: 1 .  niacin (NIASPAN) 500 MG CR tablet, Take 500 mg by mouth daily. , Disp: , Rfl:  .  omeprazole (PRILOSEC) 40 MG capsule, Take 1 capsule by mouth once daily, Disp: 90  capsule, Rfl: 3 .  REFRESH CELLUVISC 1 % GEL, Apply 1 drop to eye 3 (three) times daily. , Disp: , Rfl:  .  telmisartan (MICARDIS) 20 MG tablet, Take 1 tablet (20 mg total) by mouth daily., Disp: 90 tablet, Rfl: 0 .  timolol (TIMOPTIC) 0.5 % ophthalmic solution, Place 1 drop into both eyes daily. , Disp: , Rfl:  .  vitamin B-12 (CYANOCOBALAMIN) 1000 MCG tablet, Take 1 tablet by mouth daily., Disp: , Rfl:  .  colchicine 0.6 MG tablet, 2 tabs po on Day 1 of gout flare, then 1 tab QD until gout flare is over. (Patient not taking: Reported on 10/02/2020), Disp: 30 tablet, Rfl: 0 .  Docusate Calcium (STOOL SOFTENER PO), Take by mouth as needed.  (Patient not taking: Reported on 10/02/2020), Disp: , Rfl:  .  nitroGLYCERIN (NITROSTAT) 0.4 MG SL tablet, DISSOLVE ONE TABLET UNDER THE TONGUE EVERY 5 MINUTES AS NEEDED FOR CHEST PAIN (Patient not taking: Reported on 10/02/2020), Disp: 100 tablet, Rfl: prn  Allergies:  Allergies  Allergen Reactions  . Atorvastatin Other (See Comments)    REACTION: Muscle weakness  . Nsaids Other (See Comments)    Avoid with current kidney function.    . Colesevelam Other (See Comments)    REACTION: joint pain  . Pravastatin Other (See Comments)  Muscle aches  . Simvastatin Other (See Comments)    REACTION: Myalgias    Past Medical History, Surgical history, Social history, and Family History were reviewed and updated.  Review of Systems: Review of Systems  Constitutional: Positive for fatigue.  HENT:  Negative.   Eyes: Negative.   Respiratory: Negative.   Cardiovascular: Positive for palpitations.  Gastrointestinal: Positive for blood in stool.  Endocrine: Negative.   Genitourinary: Negative.    Musculoskeletal: Negative.   Skin: Negative.   Neurological: Negative.   Hematological: Negative.   Psychiatric/Behavioral: Negative.     Physical Exam:  weight is 165 lb 1.9 oz (74.9 kg). His oral temperature is 98.1 F (36.7 C). His blood pressure is  137/39 (abnormal) and his pulse is 53 (abnormal). His respiration is 20 and oxygen saturation is 97%.   Wt Readings from Last 3 Encounters:  10/02/20 165 lb 1.9 oz (74.9 kg)  09/03/20 166 lb (75.3 kg)  08/21/20 164 lb (74.4 kg)    Physical Exam Vitals reviewed.  HENT:     Head: Normocephalic and atraumatic.  Eyes:     Pupils: Pupils are equal, round, and reactive to light.  Cardiovascular:     Rate and Rhythm: Normal rate and regular rhythm.     Heart sounds: Normal heart sounds.  Pulmonary:     Effort: Pulmonary effort is normal.     Breath sounds: Normal breath sounds.  Abdominal:     General: Bowel sounds are normal.     Palpations: Abdomen is soft.  Musculoskeletal:        General: No tenderness or deformity. Normal range of motion.     Cervical back: Normal range of motion.  Lymphadenopathy:     Cervical: No cervical adenopathy.  Skin:    General: Skin is warm and dry.     Findings: No erythema or rash.  Neurological:     Mental Status: He is alert and oriented to person, place, and time.  Psychiatric:        Behavior: Behavior normal.        Thought Content: Thought content normal.        Judgment: Judgment normal.      Lab Results  Component Value Date   WBC 4.1 10/02/2020   HGB 9.2 (L) 10/02/2020   HCT 29.1 (L) 10/02/2020   MCV 105.8 (H) 10/02/2020   PLT 158 10/02/2020     Chemistry      Component Value Date/Time   NA 142 10/02/2020 0901   NA 142 01/05/2017 0000   K 5.3 (H) 10/02/2020 0901   CL 110 10/02/2020 0901   CO2 26 10/02/2020 0901   BUN 28 (H) 10/02/2020 0901   BUN 24 (A) 01/05/2017 0000   CREATININE 1.79 (H) 10/02/2020 0901   CREATININE 1.66 (H) 09/03/2020 1143   GLU 95 01/05/2017 0000      Component Value Date/Time   CALCIUM 9.7 10/02/2020 0901   CALCIUM 9.5 09/19/2014 0000   ALKPHOS 91 10/02/2020 0901   AST 10 (L) 10/02/2020 0901   ALT 8 10/02/2020 0901   BILITOT 0.5 10/02/2020 0901      Impression and Plan: [name] is a  very nice 74 year old white male.  He has multifactorial anemia.   As always, his potassium is on the higher side.  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  We will go ahead and give him Aranesp today.  Going to have to get him back in 3  weeks for his next Aranesp injection.  I know that he will have a nice Thanksgiving with his family.    [name], MD 11/24/202110:17 AM

## 2020-10-02 NOTE — Patient Instructions (Signed)
Darbepoetin Alfa injection What is this medicine? DARBEPOETIN ALFA (dar be POE e tin AL fa) helps your body make more red blood cells. It is used to treat anemia caused by chronic kidney failure and chemotherapy. This medicine may be used for other purposes; ask your health care provider or pharmacist if you have questions. COMMON BRAND NAME(S): Aranesp What should I tell my health care provider before I take this medicine? They need to know if you have any of these conditions:  blood clotting disorders or history of blood clots  cancer patient not on chemotherapy  cystic fibrosis  heart disease, such as angina, heart failure, or a history of a heart attack  hemoglobin level of 12 g/dL or greater  high blood pressure  low levels of folate, iron, or vitamin B12  seizures  an unusual or allergic reaction to darbepoetin, erythropoietin, albumin, hamster proteins, latex, other medicines, foods, dyes, or preservatives  pregnant or trying to get pregnant  breast-feeding How should I use this medicine? This medicine is for injection into a vein or under the skin. It is usually given by a health care professional in a hospital or clinic setting. If you get this medicine at home, you will be taught how to prepare and give this medicine. Use exactly as directed. Take your medicine at regular intervals. Do not take your medicine more often than directed. It is important that you put your used needles and syringes in a special sharps container. Do not put them in a trash can. If you do not have a sharps container, call your pharmacist or healthcare provider to get one. A special MedGuide will be given to you by the pharmacist with each prescription and refill. Be sure to read this information carefully each time. Talk to your pediatrician regarding the use of this medicine in children. While this medicine may be used in children as young as 1 month of age for selected conditions, precautions do  apply. Overdosage: If you think you have taken too much of this medicine contact a poison control center or emergency room at once. NOTE: This medicine is only for you. Do not share this medicine with others. What if I miss a dose? If you miss a dose, take it as soon as you can. If it is almost time for your next dose, take only that dose. Do not take double or extra doses. What may interact with this medicine? Do not take this medicine with any of the following medications:  epoetin alfa This list may not describe all possible interactions. Give your health care provider a list of all the medicines, herbs, non-prescription drugs, or dietary supplements you use. Also tell them if you smoke, drink alcohol, or use illegal drugs. Some items may interact with your medicine. What should I watch for while using this medicine? Your condition will be monitored carefully while you are receiving this medicine. You may need blood work done while you are taking this medicine. This medicine may cause a decrease in vitamin B6. You should make sure that you get enough vitamin B6 while you are taking this medicine. Discuss the foods you eat and the vitamins you take with your health care professional. What side effects may I notice from receiving this medicine? Side effects that you should report to your doctor or health care professional as soon as possible:  allergic reactions like skin rash, itching or hives, swelling of the face, lips, or tongue  breathing problems  changes in   vision  chest pain  confusion, trouble speaking or understanding  feeling faint or lightheaded, falls  high blood pressure  muscle aches or pains  pain, swelling, warmth in the leg  rapid weight gain  severe headaches  sudden numbness or weakness of the face, arm or leg  trouble walking, dizziness, loss of balance or coordination  seizures (convulsions)  swelling of the ankles, feet, hands  unusually weak or  tired Side effects that usually do not require medical attention (report to your doctor or health care professional if they continue or are bothersome):  diarrhea  fever, chills (flu-like symptoms)  headaches  nausea, vomiting  redness, stinging, or swelling at site where injected This list may not describe all possible side effects. Call your doctor for medical advice about side effects. You may report side effects to FDA at 1-800-FDA-1088. Where should I keep my medicine? Keep out of the reach of children. Store in a refrigerator between 2 and 8 degrees C (36 and 46 degrees F). Do not freeze. Do not shake. Throw away any unused portion if using a single-dose vial. Throw away any unused medicine after the expiration date. NOTE: This sheet is a summary. It may not cover all possible information. If you have questions about this medicine, talk to your doctor, pharmacist, or health care provider.  2020 Elsevier/Gold Standard (2017-11-10 16:44:20)  

## 2020-10-10 ENCOUNTER — Other Ambulatory Visit: Payer: Self-pay | Admitting: Cardiology

## 2020-10-14 ENCOUNTER — Other Ambulatory Visit: Payer: Self-pay | Admitting: Cardiology

## 2020-10-15 ENCOUNTER — Other Ambulatory Visit: Payer: Self-pay | Admitting: Cardiology

## 2020-10-15 ENCOUNTER — Other Ambulatory Visit: Payer: Self-pay

## 2020-10-15 MED ORDER — AMLODIPINE BESYLATE 10 MG PO TABS
10.0000 mg | ORAL_TABLET | Freq: Every day | ORAL | 3 refills | Status: DC
Start: 2020-10-15 — End: 2021-05-26

## 2020-10-24 ENCOUNTER — Ambulatory Visit: Payer: PPO

## 2020-10-24 ENCOUNTER — Other Ambulatory Visit: Payer: PPO

## 2020-10-24 ENCOUNTER — Ambulatory Visit: Payer: PPO | Admitting: Family

## 2020-10-28 ENCOUNTER — Inpatient Hospital Stay: Payer: PPO | Attending: Hematology & Oncology

## 2020-10-28 ENCOUNTER — Inpatient Hospital Stay (HOSPITAL_BASED_OUTPATIENT_CLINIC_OR_DEPARTMENT_OTHER): Payer: PPO | Admitting: Family

## 2020-10-28 ENCOUNTER — Telehealth: Payer: Self-pay | Admitting: Family

## 2020-10-28 ENCOUNTER — Encounter: Payer: Self-pay | Admitting: Family

## 2020-10-28 ENCOUNTER — Inpatient Hospital Stay: Payer: PPO

## 2020-10-28 ENCOUNTER — Other Ambulatory Visit: Payer: Self-pay

## 2020-10-28 VITALS — BP 130/49 | HR 56 | Temp 97.7°F | Resp 19 | Ht 68.0 in | Wt 167.0 lb

## 2020-10-28 DIAGNOSIS — D500 Iron deficiency anemia secondary to blood loss (chronic): Secondary | ICD-10-CM | POA: Diagnosis not present

## 2020-10-28 DIAGNOSIS — N289 Disorder of kidney and ureter, unspecified: Secondary | ICD-10-CM | POA: Insufficient documentation

## 2020-10-28 DIAGNOSIS — I480 Paroxysmal atrial fibrillation: Secondary | ICD-10-CM | POA: Diagnosis not present

## 2020-10-28 DIAGNOSIS — C187 Malignant neoplasm of sigmoid colon: Secondary | ICD-10-CM | POA: Diagnosis not present

## 2020-10-28 DIAGNOSIS — D631 Anemia in chronic kidney disease: Secondary | ICD-10-CM | POA: Diagnosis not present

## 2020-10-28 DIAGNOSIS — Z7901 Long term (current) use of anticoagulants: Secondary | ICD-10-CM | POA: Diagnosis not present

## 2020-10-28 DIAGNOSIS — Z85038 Personal history of other malignant neoplasm of large intestine: Secondary | ICD-10-CM | POA: Insufficient documentation

## 2020-10-28 LAB — CBC WITH DIFFERENTIAL (CANCER CENTER ONLY)
Abs Immature Granulocytes: 0.03 10*3/uL (ref 0.00–0.07)
Basophils Absolute: 0 10*3/uL (ref 0.0–0.1)
Basophils Relative: 1 %
Eosinophils Absolute: 0.1 10*3/uL (ref 0.0–0.5)
Eosinophils Relative: 2 %
HCT: 33.2 % — ABNORMAL LOW (ref 39.0–52.0)
Hemoglobin: 10.5 g/dL — ABNORMAL LOW (ref 13.0–17.0)
Immature Granulocytes: 1 %
Lymphocytes Relative: 34 %
Lymphs Abs: 1.4 10*3/uL (ref 0.7–4.0)
MCH: 33.9 pg (ref 26.0–34.0)
MCHC: 31.6 g/dL (ref 30.0–36.0)
MCV: 107.1 fL — ABNORMAL HIGH (ref 80.0–100.0)
Monocytes Absolute: 0.4 10*3/uL (ref 0.1–1.0)
Monocytes Relative: 10 %
Neutro Abs: 2.2 10*3/uL (ref 1.7–7.7)
Neutrophils Relative %: 52 %
Platelet Count: 167 10*3/uL (ref 150–400)
RBC: 3.1 MIL/uL — ABNORMAL LOW (ref 4.22–5.81)
RDW: 13.1 % (ref 11.5–15.5)
WBC Count: 4.1 10*3/uL (ref 4.0–10.5)
nRBC: 0 % (ref 0.0–0.2)

## 2020-10-28 LAB — RETICULOCYTES
Immature Retic Fract: 4.2 % (ref 2.3–15.9)
RBC.: 3.06 MIL/uL — ABNORMAL LOW (ref 4.22–5.81)
Retic Count, Absolute: 57.5 10*3/uL (ref 19.0–186.0)
Retic Ct Pct: 1.9 % (ref 0.4–3.1)

## 2020-10-28 LAB — CMP (CANCER CENTER ONLY)
ALT: 8 U/L (ref 0–44)
AST: 11 U/L — ABNORMAL LOW (ref 15–41)
Albumin: 4.3 g/dL (ref 3.5–5.0)
Alkaline Phosphatase: 107 U/L (ref 38–126)
Anion gap: 6 (ref 5–15)
BUN: 33 mg/dL — ABNORMAL HIGH (ref 8–23)
CO2: 25 mmol/L (ref 22–32)
Calcium: 9.7 mg/dL (ref 8.9–10.3)
Chloride: 107 mmol/L (ref 98–111)
Creatinine: 2.05 mg/dL — ABNORMAL HIGH (ref 0.61–1.24)
GFR, Estimated: 33 mL/min — ABNORMAL LOW (ref >60)
Glucose, Bld: 106 mg/dL — ABNORMAL HIGH (ref 70–99)
Potassium: 5.7 mmol/L — ABNORMAL HIGH (ref 3.5–5.1)
Sodium: 138 mmol/L (ref 135–145)
Total Bilirubin: 0.4 mg/dL (ref 0.3–1.2)
Total Protein: 6.9 g/dL (ref 6.5–8.1)

## 2020-10-28 MED ORDER — DARBEPOETIN ALFA 300 MCG/0.6ML IJ SOSY
300.0000 ug | PREFILLED_SYRINGE | Freq: Once | INTRAMUSCULAR | Status: AC
  Administered 2020-10-28: 300 ug via SUBCUTANEOUS

## 2020-10-28 MED ORDER — DARBEPOETIN ALFA 300 MCG/0.6ML IJ SOSY
PREFILLED_SYRINGE | INTRAMUSCULAR | Status: AC
  Filled 2020-10-28: qty 0.6

## 2020-10-28 NOTE — Progress Notes (Signed)
Hematology and Oncology Follow Up Visit  Roberto Fields [id number] [birth year] 74 y.o. 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 is here today with his wife for follow-up. He is doing fairly well but has had diarrhea daily for the last 2 weeks. He has not tried imodium.  He denies blood loss. No abnormal bruising, no petechiae.  We discussed his following up with GI due to the history of colon resection with colon cancer last year. They stated that seeing all the different specialists is getting to expensive and they can't afford it.  I will forward today's note to his PCP and Dr. Lyndel Safe for review. We will go ahead and have him try Imodium and see if this helps.  He notes some fatigue at times.  He has SOB with over exertion secondary to COPD. He a takes a break to rest when needed.  He denies fever, chills, n/v, cough, dizziness, chest pain, palpitations, abdominal pain or changes in bowel or bladder habits.  No swelling, tenderness, numbness or tingling in his extremities at this time. He occasionally notes mild swelling in his feet and ankles.  Pedal pulses are 2+.  No falls or syncopal episodes to report.  He states that he has a good appetite and is staying well hydrated. His weight is stable at 167 lbs.   ECOG Performance Status: 1 - Symptomatic but completely ambulatory  Medications:  Allergies as of 10/28/2020      Reactions   Atorvastatin Other (See Comments)   REACTION: Muscle weakness   Nsaids Other (See Comments)   Avoid with current kidney function.     Colesevelam  Other (See Comments)   REACTION: joint pain   Pravastatin Other (See Comments)   Muscle aches   Simvastatin Other (See Comments)   REACTION: Myalgias      Medication List       Accurate as of October 28, 2020  3:06 PM. If you have any questions, ask your nurse or doctor.        amLODipine 10 MG tablet Commonly known as: NORVASC Take 1 tablet (10 mg total) by mouth daily.   colchicine 0.6 MG tablet 2 tabs po on Day 1 of gout flare, then 1 tab QD until gout flare is over.   Eliquis 5 MG Tabs tablet Generic drug: apixaban Take 1 tablet by mouth twice daily   gabapentin 300 MG capsule Commonly known as: NEURONTIN Take 600 mg by mouth 2 (two) times daily.   IRON SUPPLEMENT PO Take 1 tablet by mouth daily.   levothyroxine 88 MCG tablet Commonly known as: Euthyrox Take 1 tablet (88 mcg total) by mouth daily.   niacin 500 MG CR tablet Commonly known as: NIASPAN Take 500 mg by mouth daily.   nitroGLYCERIN 0.4 MG SL tablet Commonly known as: NITROSTAT DISSOLVE ONE TABLET UNDER THE TONGUE EVERY 5 MINUTES AS NEEDED FOR CHEST PAIN   omeprazole 40 MG capsule Commonly known as: PRILOSEC Take 1 capsule by mouth once daily   Refresh Celluvisc 1 % Gel Generic drug: Carboxymethylcellulose Sod PF Apply 1 drop to eye  3 (three) times daily.   STOOL SOFTENER PO Take by mouth as needed.   telmisartan 20 MG tablet Commonly known as: MICARDIS Take 1 tablet (20 mg total) by mouth daily.   timolol 0.5 % ophthalmic solution Commonly known as: TIMOPTIC Place 1 drop into both eyes daily.   TYLENOL PO Take by mouth daily as needed.   vitamin B-12 1000 MCG tablet Commonly known as: CYANOCOBALAMIN Take 1 tablet by mouth daily.   Xelpros 0.005 % Emul Generic drug: Latanoprost Apply 1 drop to eye at bedtime.       Allergies:  Allergies  Allergen Reactions  . Atorvastatin Other (See Comments)    REACTION: Muscle weakness  . Nsaids Other (See Comments)    Avoid with  current kidney function.    . Colesevelam Other (See Comments)    REACTION: joint pain  . Pravastatin Other (See Comments)    Muscle aches  . Simvastatin Other (See Comments)    REACTION: Myalgias    Past Medical History, Surgical history, Social history, and Family History were reviewed and updated.  Review of Systems: All other 10 point review of systems is negative.   Physical Exam:  height is 5\' 8"  (1.727 m) and weight is 167 lb (75.8 kg). His oral temperature is 97.7 F (36.5 C). His blood pressure is 130/49 (abnormal) and his pulse is 56 (abnormal). His respiration is 19 and oxygen saturation is 100%.   Wt Readings from Last 3 Encounters:  10/28/20 167 lb (75.8 kg)  10/02/20 165 lb 1.9 oz (74.9 kg)  09/03/20 166 lb (75.3 kg)    Ocular: Sclerae unicteric, pupils equal, round and reactive to light Ear-nose-throat: Oropharynx clear, dentition fair Lymphatic: No cervical or supraclavicular adenopathy Lungs no rales or rhonchi, good excursion bilaterally Heart regular rate and rhythm, no murmur appreciated Abd soft, nontender, positive bowel sounds MSK no focal spinal tenderness, no joint edema Neuro: non-focal, well-oriented, appropriate affect Breasts: Deferred   Lab Results  Component Value Date   WBC 4.1 10/28/2020   HGB 10.5 (L) 10/28/2020   HCT 33.2 (L) 10/28/2020   MCV 107.1 (H) 10/28/2020   PLT 167 10/28/2020   Lab Results  Component Value Date   FERRITIN 50 10/02/2020   IRON 130 10/02/2020   TIBC 319 10/02/2020   UIBC 189 10/02/2020   IRONPCTSAT 41 10/02/2020   Lab Results  Component Value Date   RETICCTPCT 1.9 10/28/2020   RBC 3.06 (L) 10/28/2020   RBC 3.10 (L) 10/28/2020   Lab Results  Component Value Date   KPAFRELGTCHN 64.7 (H) 06/27/2020   LAMBDASER 46.3 (H) 06/27/2020   KAPLAMBRATIO 1.40 06/27/2020   Lab Results  Component Value Date   IGGSERUM 649 06/27/2020   IGA 223 06/27/2020   IGMSERUM 140 06/27/2020   Lab Results  Component  Value Date   TOTALPROTELP 6.3 06/27/2020     Chemistry      Component Value Date/Time   NA 138 10/28/2020 1350   NA 142 01/05/2017 0000   K 5.7 (H) 10/28/2020 1350   CL 107 10/28/2020 1350   CO2 25 10/28/2020 1350   BUN 33 (H) 10/28/2020 1350   BUN 24 (A) 01/05/2017 0000   CREATININE 2.05 (H) 10/28/2020 1350   CREATININE 1.66 (H) 09/03/2020 1143   GLU 95 01/05/2017 0000      Component Value Date/Time   CALCIUM 9.7 10/28/2020 1350   CALCIUM 9.5 09/19/2014 0000   ALKPHOS 107 10/28/2020 1350   AST 11 (L)  10/28/2020 1350   ALT 8 10/28/2020 1350   BILITOT 0.4 10/28/2020 1350       Impression and Plan: Mr. Tremont is a very pleasant 74 yo caucasian gentleman with multifactorial anemia as well as history of colon cancer treated with resection in November 2020. Marland Kitchen  He received Retacrit today for Hgb 10.5.  Iron studies are pending. We will replace if needed.  I spoke with Dr. Marin Olp and we will have him try Imodium for the diarrhea.  We will check labs and give ESA if needed every 3 weeks.  Follow-up in 3 months. They are happy with this as it will help them financially.  They were encouraged to contact our office with any questions or concerns. We can certainly see him sooner if needed.   Laverna Peace, NP 12/20/20213:06 PM

## 2020-10-28 NOTE — Patient Instructions (Signed)
Darbepoetin Alfa injection What is this medicine? DARBEPOETIN ALFA (dar be POE e tin AL fa) helps your body make more red blood cells. It is used to treat anemia caused by chronic kidney failure and chemotherapy. This medicine may be used for other purposes; ask your health care provider or pharmacist if you have questions. COMMON BRAND NAME(S): Aranesp What should I tell my health care provider before I take this medicine? They need to know if you have any of these conditions:  blood clotting disorders or history of blood clots  cancer patient not on chemotherapy  cystic fibrosis  heart disease, such as angina, heart failure, or a history of a heart attack  hemoglobin level of 12 g/dL or greater  high blood pressure  low levels of folate, iron, or vitamin B12  seizures  an unusual or allergic reaction to darbepoetin, erythropoietin, albumin, hamster proteins, latex, other medicines, foods, dyes, or preservatives  pregnant or trying to get pregnant  breast-feeding How should I use this medicine? This medicine is for injection into a vein or under the skin. It is usually given by a health care professional in a hospital or clinic setting. If you get this medicine at home, you will be taught how to prepare and give this medicine. Use exactly as directed. Take your medicine at regular intervals. Do not take your medicine more often than directed. It is important that you put your used needles and syringes in a special sharps container. Do not put them in a trash can. If you do not have a sharps container, call your pharmacist or healthcare provider to get one. A special MedGuide will be given to you by the pharmacist with each prescription and refill. Be sure to read this information carefully each time. Talk to your pediatrician regarding the use of this medicine in children. While this medicine may be used in children as young as 1 month of age for selected conditions, precautions do  apply. Overdosage: If you think you have taken too much of this medicine contact a poison control center or emergency room at once. NOTE: This medicine is only for you. Do not share this medicine with others. What if I miss a dose? If you miss a dose, take it as soon as you can. If it is almost time for your next dose, take only that dose. Do not take double or extra doses. What may interact with this medicine? Do not take this medicine with any of the following medications:  epoetin alfa This list may not describe all possible interactions. Give your health care provider a list of all the medicines, herbs, non-prescription drugs, or dietary supplements you use. Also tell them if you smoke, drink alcohol, or use illegal drugs. Some items may interact with your medicine. What should I watch for while using this medicine? Your condition will be monitored carefully while you are receiving this medicine. You may need blood work done while you are taking this medicine. This medicine may cause a decrease in vitamin B6. You should make sure that you get enough vitamin B6 while you are taking this medicine. Discuss the foods you eat and the vitamins you take with your health care professional. What side effects may I notice from receiving this medicine? Side effects that you should report to your doctor or health care professional as soon as possible:  allergic reactions like skin rash, itching or hives, swelling of the face, lips, or tongue  breathing problems  changes in   vision  chest pain  confusion, trouble speaking or understanding  feeling faint or lightheaded, falls  high blood pressure  muscle aches or pains  pain, swelling, warmth in the leg  rapid weight gain  severe headaches  sudden numbness or weakness of the face, arm or leg  trouble walking, dizziness, loss of balance or coordination  seizures (convulsions)  swelling of the ankles, feet, hands  unusually weak or  tired Side effects that usually do not require medical attention (report to your doctor or health care professional if they continue or are bothersome):  diarrhea  fever, chills (flu-like symptoms)  headaches  nausea, vomiting  redness, stinging, or swelling at site where injected This list may not describe all possible side effects. Call your doctor for medical advice about side effects. You may report side effects to FDA at 1-800-FDA-1088. Where should I keep my medicine? Keep out of the reach of children. Store in a refrigerator between 2 and 8 degrees C (36 and 46 degrees F). Do not freeze. Do not shake. Throw away any unused portion if using a single-dose vial. Throw away any unused medicine after the expiration date. NOTE: This sheet is a summary. It may not cover all possible information. If you have questions about this medicine, talk to your doctor, pharmacist, or health care provider.  2020 Elsevier/Gold Standard (2017-11-10 16:44:20)  

## 2020-10-28 NOTE — Telephone Encounter (Signed)
Appointments scheduled calendar printed per 12/20 los 

## 2020-10-29 LAB — IRON AND TIBC
Iron: 134 ug/dL (ref 42–163)
Saturation Ratios: 37 % (ref 20–55)
TIBC: 361 ug/dL (ref 202–409)
UIBC: 227 ug/dL (ref 117–376)

## 2020-10-29 LAB — FERRITIN: Ferritin: 70 ng/mL (ref 24–336)

## 2020-11-06 DIAGNOSIS — H35373 Puckering of macula, bilateral: Secondary | ICD-10-CM | POA: Diagnosis not present

## 2020-11-06 DIAGNOSIS — H16142 Punctate keratitis, left eye: Secondary | ICD-10-CM | POA: Diagnosis not present

## 2020-11-06 DIAGNOSIS — H35033 Hypertensive retinopathy, bilateral: Secondary | ICD-10-CM | POA: Diagnosis not present

## 2020-11-06 DIAGNOSIS — H35361 Drusen (degenerative) of macula, right eye: Secondary | ICD-10-CM | POA: Diagnosis not present

## 2020-11-18 ENCOUNTER — Other Ambulatory Visit: Payer: Self-pay

## 2020-11-18 ENCOUNTER — Inpatient Hospital Stay: Payer: PPO

## 2020-11-18 ENCOUNTER — Inpatient Hospital Stay: Payer: PPO | Attending: Hematology & Oncology

## 2020-11-18 VITALS — BP 147/60 | HR 61 | Temp 97.6°F | Resp 18

## 2020-11-18 DIAGNOSIS — Z85038 Personal history of other malignant neoplasm of large intestine: Secondary | ICD-10-CM | POA: Insufficient documentation

## 2020-11-18 DIAGNOSIS — D631 Anemia in chronic kidney disease: Secondary | ICD-10-CM | POA: Diagnosis not present

## 2020-11-18 DIAGNOSIS — D500 Iron deficiency anemia secondary to blood loss (chronic): Secondary | ICD-10-CM

## 2020-11-18 DIAGNOSIS — N289 Disorder of kidney and ureter, unspecified: Secondary | ICD-10-CM | POA: Insufficient documentation

## 2020-11-18 DIAGNOSIS — C187 Malignant neoplasm of sigmoid colon: Secondary | ICD-10-CM

## 2020-11-18 LAB — SAMPLE TO BLOOD BANK

## 2020-11-18 LAB — CBC WITH DIFFERENTIAL (CANCER CENTER ONLY)
Abs Immature Granulocytes: 0.02 10*3/uL (ref 0.00–0.07)
Basophils Absolute: 0 10*3/uL (ref 0.0–0.1)
Basophils Relative: 0 %
Eosinophils Absolute: 0.1 10*3/uL (ref 0.0–0.5)
Eosinophils Relative: 2 %
HCT: 32.8 % — ABNORMAL LOW (ref 39.0–52.0)
Hemoglobin: 10.5 g/dL — ABNORMAL LOW (ref 13.0–17.0)
Immature Granulocytes: 0 %
Lymphocytes Relative: 26 %
Lymphs Abs: 1.3 10*3/uL (ref 0.7–4.0)
MCH: 33.8 pg (ref 26.0–34.0)
MCHC: 32 g/dL (ref 30.0–36.0)
MCV: 105.5 fL — ABNORMAL HIGH (ref 80.0–100.0)
Monocytes Absolute: 0.4 10*3/uL (ref 0.1–1.0)
Monocytes Relative: 9 %
Neutro Abs: 3 10*3/uL (ref 1.7–7.7)
Neutrophils Relative %: 63 %
Platelet Count: 179 10*3/uL (ref 150–400)
RBC: 3.11 MIL/uL — ABNORMAL LOW (ref 4.22–5.81)
RDW: 13.1 % (ref 11.5–15.5)
WBC Count: 4.8 10*3/uL (ref 4.0–10.5)
nRBC: 0 % (ref 0.0–0.2)

## 2020-11-18 LAB — CMP (CANCER CENTER ONLY)
ALT: 12 U/L (ref 0–44)
AST: 15 U/L (ref 15–41)
Albumin: 3.9 g/dL (ref 3.5–5.0)
Alkaline Phosphatase: 99 U/L (ref 38–126)
Anion gap: 9 (ref 5–15)
BUN: 27 mg/dL — ABNORMAL HIGH (ref 8–23)
CO2: 24 mmol/L (ref 22–32)
Calcium: 9 mg/dL (ref 8.9–10.3)
Chloride: 109 mmol/L (ref 98–111)
Creatinine: 1.88 mg/dL — ABNORMAL HIGH (ref 0.61–1.24)
GFR, Estimated: 37 mL/min — ABNORMAL LOW (ref >60)
Glucose, Bld: 128 mg/dL — ABNORMAL HIGH (ref 70–99)
Potassium: 4.8 mmol/L (ref 3.5–5.1)
Sodium: 142 mmol/L (ref 135–145)
Total Bilirubin: 0.4 mg/dL (ref 0.3–1.2)
Total Protein: 6.6 g/dL (ref 6.5–8.1)

## 2020-11-18 MED ORDER — DARBEPOETIN ALFA 300 MCG/0.6ML IJ SOSY
300.0000 ug | PREFILLED_SYRINGE | Freq: Once | INTRAMUSCULAR | Status: AC
  Administered 2020-11-18: 300 ug via SUBCUTANEOUS

## 2020-11-18 NOTE — Patient Instructions (Signed)
Darbepoetin Alfa injection What is this medicine? DARBEPOETIN ALFA (dar be POE e tin AL fa) helps your body make more red blood cells. It is used to treat anemia caused by chronic kidney failure and chemotherapy. This medicine may be used for other purposes; ask your health care provider or pharmacist if you have questions. COMMON BRAND NAME(S): Aranesp What should I tell my health care provider before I take this medicine? They need to know if you have any of these conditions:  blood clotting disorders or history of blood clots  cancer patient not on chemotherapy  cystic fibrosis  heart disease, such as angina, heart failure, or a history of a heart attack  hemoglobin level of 12 g/dL or greater  high blood pressure  low levels of folate, iron, or vitamin B12  seizures  an unusual or allergic reaction to darbepoetin, erythropoietin, albumin, hamster proteins, latex, other medicines, foods, dyes, or preservatives  pregnant or trying to get pregnant  breast-feeding How should I use this medicine? This medicine is for injection into a vein or under the skin. It is usually given by a health care professional in a hospital or clinic setting. If you get this medicine at home, you will be taught how to prepare and give this medicine. Use exactly as directed. Take your medicine at regular intervals. Do not take your medicine more often than directed. It is important that you put your used needles and syringes in a special sharps container. Do not put them in a trash can. If you do not have a sharps container, call your pharmacist or healthcare provider to get one. A special MedGuide will be given to you by the pharmacist with each prescription and refill. Be sure to read this information carefully each time. Talk to your pediatrician regarding the use of this medicine in children. While this medicine may be used in children as young as 1 month of age for selected conditions, precautions do  apply. Overdosage: If you think you have taken too much of this medicine contact a poison control center or emergency room at once. NOTE: This medicine is only for you. Do not share this medicine with others. What if I miss a dose? If you miss a dose, take it as soon as you can. If it is almost time for your next dose, take only that dose. Do not take double or extra doses. What may interact with this medicine? Do not take this medicine with any of the following medications:  epoetin alfa This list may not describe all possible interactions. Give your health care provider a list of all the medicines, herbs, non-prescription drugs, or dietary supplements you use. Also tell them if you smoke, drink alcohol, or use illegal drugs. Some items may interact with your medicine. What should I watch for while using this medicine? Your condition will be monitored carefully while you are receiving this medicine. You may need blood work done while you are taking this medicine. This medicine may cause a decrease in vitamin B6. You should make sure that you get enough vitamin B6 while you are taking this medicine. Discuss the foods you eat and the vitamins you take with your health care professional. What side effects may I notice from receiving this medicine? Side effects that you should report to your doctor or health care professional as soon as possible:  allergic reactions like skin rash, itching or hives, swelling of the face, lips, or tongue  breathing problems  changes in   vision  chest pain  confusion, trouble speaking or understanding  feeling faint or lightheaded, falls  high blood pressure  muscle aches or pains  pain, swelling, warmth in the leg  rapid weight gain  severe headaches  sudden numbness or weakness of the face, arm or leg  trouble walking, dizziness, loss of balance or coordination  seizures (convulsions)  swelling of the ankles, feet, hands  unusually weak or  tired Side effects that usually do not require medical attention (report to your doctor or health care professional if they continue or are bothersome):  diarrhea  fever, chills (flu-like symptoms)  headaches  nausea, vomiting  redness, stinging, or swelling at site where injected This list may not describe all possible side effects. Call your doctor for medical advice about side effects. You may report side effects to FDA at 1-800-FDA-1088. Where should I keep my medicine? Keep out of the reach of children. Store in a refrigerator between 2 and 8 degrees C (36 and 46 degrees F). Do not freeze. Do not shake. Throw away any unused portion if using a single-dose vial. Throw away any unused medicine after the expiration date. NOTE: This sheet is a summary. It may not cover all possible information. If you have questions about this medicine, talk to your doctor, pharmacist, or health care provider.  2021 Elsevier/Gold Standard (2017-11-10 16:44:20)  

## 2020-12-02 ENCOUNTER — Ambulatory Visit: Payer: PPO | Admitting: Family Medicine

## 2020-12-09 ENCOUNTER — Inpatient Hospital Stay: Payer: PPO

## 2020-12-09 ENCOUNTER — Other Ambulatory Visit: Payer: Self-pay

## 2020-12-09 DIAGNOSIS — D631 Anemia in chronic kidney disease: Secondary | ICD-10-CM

## 2020-12-09 DIAGNOSIS — D500 Iron deficiency anemia secondary to blood loss (chronic): Secondary | ICD-10-CM

## 2020-12-09 DIAGNOSIS — N289 Disorder of kidney and ureter, unspecified: Secondary | ICD-10-CM | POA: Diagnosis not present

## 2020-12-09 LAB — CMP (CANCER CENTER ONLY)
ALT: 7 U/L (ref 0–44)
AST: 10 U/L — ABNORMAL LOW (ref 15–41)
Albumin: 4.4 g/dL (ref 3.5–5.0)
Alkaline Phosphatase: 107 U/L (ref 38–126)
Anion gap: 7 (ref 5–15)
BUN: 31 mg/dL — ABNORMAL HIGH (ref 8–23)
CO2: 25 mmol/L (ref 22–32)
Calcium: 10.2 mg/dL (ref 8.9–10.3)
Chloride: 110 mmol/L (ref 98–111)
Creatinine: 2.01 mg/dL — ABNORMAL HIGH (ref 0.61–1.24)
GFR, Estimated: 34 mL/min — ABNORMAL LOW (ref >60)
Glucose, Bld: 77 mg/dL (ref 70–99)
Potassium: 6.1 mmol/L — ABNORMAL HIGH (ref 3.5–5.1)
Sodium: 142 mmol/L (ref 135–145)
Total Bilirubin: 0.4 mg/dL (ref 0.3–1.2)
Total Protein: 6.8 g/dL (ref 6.5–8.1)

## 2020-12-09 LAB — CBC WITH DIFFERENTIAL (CANCER CENTER ONLY)
Abs Immature Granulocytes: 0.02 10*3/uL (ref 0.00–0.07)
Basophils Absolute: 0 10*3/uL (ref 0.0–0.1)
Basophils Relative: 0 %
Eosinophils Absolute: 0.1 10*3/uL (ref 0.0–0.5)
Eosinophils Relative: 2 %
HCT: 37.5 % — ABNORMAL LOW (ref 39.0–52.0)
Hemoglobin: 11.9 g/dL — ABNORMAL LOW (ref 13.0–17.0)
Immature Granulocytes: 0 %
Lymphocytes Relative: 28 %
Lymphs Abs: 1.4 10*3/uL (ref 0.7–4.0)
MCH: 33 pg (ref 26.0–34.0)
MCHC: 31.7 g/dL (ref 30.0–36.0)
MCV: 103.9 fL — ABNORMAL HIGH (ref 80.0–100.0)
Monocytes Absolute: 0.4 10*3/uL (ref 0.1–1.0)
Monocytes Relative: 9 %
Neutro Abs: 3.1 10*3/uL (ref 1.7–7.7)
Neutrophils Relative %: 61 %
Platelet Count: 177 10*3/uL (ref 150–400)
RBC: 3.61 MIL/uL — ABNORMAL LOW (ref 4.22–5.81)
RDW: 13.1 % (ref 11.5–15.5)
WBC Count: 5.2 10*3/uL (ref 4.0–10.5)
nRBC: 0 % (ref 0.0–0.2)

## 2020-12-09 NOTE — Progress Notes (Signed)
No aranesp today per order parameters. dph

## 2020-12-10 ENCOUNTER — Telehealth: Payer: Self-pay

## 2020-12-10 ENCOUNTER — Telehealth: Payer: Self-pay | Admitting: *Deleted

## 2020-12-10 DIAGNOSIS — E875 Hyperkalemia: Secondary | ICD-10-CM

## 2020-12-10 LAB — SAMPLE TO BLOOD BANK

## 2020-12-10 NOTE — Telephone Encounter (Signed)
Lane has very high Potassium level. He was advised to reach out to PCP. Please advise.   Potassium 3.5 - 5.1 mmol/L 6.1High

## 2020-12-10 NOTE — Telephone Encounter (Signed)
-----   Message from Eliezer Bottom, NP sent at 12/10/2020 10:26 AM EST ----- Potassium is quite high again. Is he taking a supplement? I forwarded today's lab work to his PCP.   ----- Message ----- From: Buel Ream, Lab In [place] Sent: 12/09/2020   1:38 PM EST To: Eliezer Bottom, NP

## 2020-12-10 NOTE — Telephone Encounter (Signed)
Patient notified per order of S. [place] NP that his potassium is elevated at 6.1 and to contact Dr. Gardiner Ramus office for f/u regarding elevated potassium.  Pt states that he will contact Dr. Gardiner Ramus office today.

## 2020-12-11 NOTE — Telephone Encounter (Signed)
[name] advised. Patient scheduled.

## 2020-12-11 NOTE — Telephone Encounter (Signed)
If he has some kayexalate from the summer left he needs to restart it and recheck potassium in one week and schedule an appt with me in one week to address this

## 2020-12-20 ENCOUNTER — Ambulatory Visit: Payer: PPO | Admitting: Family Medicine

## 2020-12-24 ENCOUNTER — Ambulatory Visit: Payer: PPO | Admitting: Family Medicine

## 2020-12-26 ENCOUNTER — Telehealth: Payer: Self-pay

## 2020-12-26 NOTE — Telephone Encounter (Signed)
pts wife called in to r/s his 2/21 appt to earlier due to a conflict in time    Roberto Fields

## 2020-12-30 ENCOUNTER — Inpatient Hospital Stay: Payer: PPO | Attending: Hematology & Oncology

## 2020-12-30 ENCOUNTER — Other Ambulatory Visit: Payer: Self-pay

## 2020-12-30 ENCOUNTER — Encounter: Payer: Self-pay | Admitting: Family Medicine

## 2020-12-30 ENCOUNTER — Inpatient Hospital Stay: Payer: PPO

## 2020-12-30 ENCOUNTER — Ambulatory Visit: Payer: PPO | Admitting: Hematology & Oncology

## 2020-12-30 ENCOUNTER — Ambulatory Visit (INDEPENDENT_AMBULATORY_CARE_PROVIDER_SITE_OTHER): Payer: PPO | Admitting: Family Medicine

## 2020-12-30 VITALS — BP 126/41 | HR 52 | Wt 177.0 lb

## 2020-12-30 VITALS — BP 136/63 | HR 52 | Temp 98.0°F | Resp 18

## 2020-12-30 DIAGNOSIS — Z85038 Personal history of other malignant neoplasm of large intestine: Secondary | ICD-10-CM | POA: Diagnosis not present

## 2020-12-30 DIAGNOSIS — D500 Iron deficiency anemia secondary to blood loss (chronic): Secondary | ICD-10-CM

## 2020-12-30 DIAGNOSIS — N289 Disorder of kidney and ureter, unspecified: Secondary | ICD-10-CM | POA: Diagnosis not present

## 2020-12-30 DIAGNOSIS — D631 Anemia in chronic kidney disease: Secondary | ICD-10-CM | POA: Diagnosis not present

## 2020-12-30 DIAGNOSIS — C187 Malignant neoplasm of sigmoid colon: Secondary | ICD-10-CM

## 2020-12-30 DIAGNOSIS — I10 Essential (primary) hypertension: Secondary | ICD-10-CM | POA: Diagnosis not present

## 2020-12-30 DIAGNOSIS — E875 Hyperkalemia: Secondary | ICD-10-CM

## 2020-12-30 LAB — CMP (CANCER CENTER ONLY)
ALT: 9 U/L (ref 0–44)
AST: 10 U/L — ABNORMAL LOW (ref 15–41)
Albumin: 4.2 g/dL (ref 3.5–5.0)
Alkaline Phosphatase: 126 U/L (ref 38–126)
Anion gap: 7 (ref 5–15)
BUN: 27 mg/dL — ABNORMAL HIGH (ref 8–23)
CO2: 26 mmol/L (ref 22–32)
Calcium: 9.6 mg/dL (ref 8.9–10.3)
Chloride: 107 mmol/L (ref 98–111)
Creatinine: 1.89 mg/dL — ABNORMAL HIGH (ref 0.61–1.24)
GFR, Estimated: 36 mL/min — ABNORMAL LOW (ref >60)
Glucose, Bld: 80 mg/dL (ref 70–99)
Potassium: 4.7 mmol/L (ref 3.5–5.1)
Sodium: 140 mmol/L (ref 135–145)
Total Bilirubin: 0.4 mg/dL (ref 0.3–1.2)
Total Protein: 6.6 g/dL (ref 6.5–8.1)

## 2020-12-30 LAB — CBC WITH DIFFERENTIAL (CANCER CENTER ONLY)
Abs Immature Granulocytes: 0.02 10*3/uL (ref 0.00–0.07)
Basophils Absolute: 0 10*3/uL (ref 0.0–0.1)
Basophils Relative: 0 %
Eosinophils Absolute: 0.1 10*3/uL (ref 0.0–0.5)
Eosinophils Relative: 2 %
HCT: 32.8 % — ABNORMAL LOW (ref 39.0–52.0)
Hemoglobin: 10.7 g/dL — ABNORMAL LOW (ref 13.0–17.0)
Immature Granulocytes: 0 %
Lymphocytes Relative: 26 %
Lymphs Abs: 1.4 10*3/uL (ref 0.7–4.0)
MCH: 33 pg (ref 26.0–34.0)
MCHC: 32.6 g/dL (ref 30.0–36.0)
MCV: 101.2 fL — ABNORMAL HIGH (ref 80.0–100.0)
Monocytes Absolute: 0.5 10*3/uL (ref 0.1–1.0)
Monocytes Relative: 10 %
Neutro Abs: 3.3 10*3/uL (ref 1.7–7.7)
Neutrophils Relative %: 62 %
Platelet Count: 173 10*3/uL (ref 150–400)
RBC: 3.24 MIL/uL — ABNORMAL LOW (ref 4.22–5.81)
RDW: 12.7 % (ref 11.5–15.5)
WBC Count: 5.4 10*3/uL (ref 4.0–10.5)
nRBC: 0 % (ref 0.0–0.2)

## 2020-12-30 LAB — SAMPLE TO BLOOD BANK

## 2020-12-30 MED ORDER — DARBEPOETIN ALFA 300 MCG/0.6ML IJ SOSY
300.0000 ug | PREFILLED_SYRINGE | Freq: Once | INTRAMUSCULAR | Status: AC
  Administered 2020-12-30: 300 ug via SUBCUTANEOUS

## 2020-12-30 MED ORDER — DARBEPOETIN ALFA 300 MCG/0.6ML IJ SOSY
PREFILLED_SYRINGE | INTRAMUSCULAR | Status: AC
  Filled 2020-12-30: qty 0.6

## 2020-12-30 MED ORDER — LOSARTAN POTASSIUM-HCTZ 50-12.5 MG PO TABS: 1.0000 | ORAL_TABLET | Freq: Every day | ORAL | 0 refills | Status: DC

## 2020-12-30 MED ORDER — SODIUM POLYSTYRENE SULFONATE PO POWD: Freq: Every day | ORAL | 0 refills | Status: DC

## 2020-12-30 NOTE — Assessment & Plan Note (Signed)
Blood pressure actually looks good today. But I would like to try to combine his ARB with a diuretic to see if we can make sure that this is not contributing to hyperkalemia. We will need to monitor blood pressure carefully.

## 2020-12-30 NOTE — Patient Instructions (Signed)
Darbepoetin Alfa injection What is this medicine? DARBEPOETIN ALFA (dar be POE e tin AL fa) helps your body make more red blood cells. It is used to treat anemia caused by chronic kidney failure and chemotherapy. This medicine may be used for other purposes; ask your health care provider or pharmacist if you have questions. COMMON BRAND NAME(S): Aranesp What should I tell my health care provider before I take this medicine? They need to know if you have any of these conditions:  blood clotting disorders or history of blood clots  cancer patient not on chemotherapy  cystic fibrosis  heart disease, such as angina, heart failure, or a history of a heart attack  hemoglobin level of 12 g/dL or greater  high blood pressure  low levels of folate, iron, or vitamin B12  seizures  an unusual or allergic reaction to darbepoetin, erythropoietin, albumin, hamster proteins, latex, other medicines, foods, dyes, or preservatives  pregnant or trying to get pregnant  breast-feeding How should I use this medicine? This medicine is for injection into a vein or under the skin. It is usually given by a health care professional in a hospital or clinic setting. If you get this medicine at home, you will be taught how to prepare and give this medicine. Use exactly as directed. Take your medicine at regular intervals. Do not take your medicine more often than directed. It is important that you put your used needles and syringes in a special sharps container. Do not put them in a trash can. If you do not have a sharps container, call your pharmacist or healthcare provider to get one. A special MedGuide will be given to you by the pharmacist with each prescription and refill. Be sure to read this information carefully each time. Talk to your pediatrician regarding the use of this medicine in children. While this medicine may be used in children as young as 1 month of age for selected conditions, precautions do  apply. Overdosage: If you think you have taken too much of this medicine contact a poison control center or emergency room at once. NOTE: This medicine is only for you. Do not share this medicine with others. What if I miss a dose? If you miss a dose, take it as soon as you can. If it is almost time for your next dose, take only that dose. Do not take double or extra doses. What may interact with this medicine? Do not take this medicine with any of the following medications:  epoetin alfa This list may not describe all possible interactions. Give your health care provider a list of all the medicines, herbs, non-prescription drugs, or dietary supplements you use. Also tell them if you smoke, drink alcohol, or use illegal drugs. Some items may interact with your medicine. What should I watch for while using this medicine? Your condition will be monitored carefully while you are receiving this medicine. You may need blood work done while you are taking this medicine. This medicine may cause a decrease in vitamin B6. You should make sure that you get enough vitamin B6 while you are taking this medicine. Discuss the foods you eat and the vitamins you take with your health care professional. What side effects may I notice from receiving this medicine? Side effects that you should report to your doctor or health care professional as soon as possible:  allergic reactions like skin rash, itching or hives, swelling of the face, lips, or tongue  breathing problems  changes in   vision  chest pain  confusion, trouble speaking or understanding  feeling faint or lightheaded, falls  high blood pressure  muscle aches or pains  pain, swelling, warmth in the leg  rapid weight gain  severe headaches  sudden numbness or weakness of the face, arm or leg  trouble walking, dizziness, loss of balance or coordination  seizures (convulsions)  swelling of the ankles, feet, hands  unusually weak or  tired Side effects that usually do not require medical attention (report to your doctor or health care professional if they continue or are bothersome):  diarrhea  fever, chills (flu-like symptoms)  headaches  nausea, vomiting  redness, stinging, or swelling at site where injected This list may not describe all possible side effects. Call your doctor for medical advice about side effects. You may report side effects to FDA at 1-800-FDA-1088. Where should I keep my medicine? Keep out of the reach of children. Store in a refrigerator between 2 and 8 degrees C (36 and 46 degrees F). Do not freeze. Do not shake. Throw away any unused portion if using a single-dose vial. Throw away any unused medicine after the expiration date. NOTE: This sheet is a summary. It may not cover all possible information. If you have questions about this medicine, talk to your doctor, pharmacist, or health care provider.  2021 Elsevier/Gold Standard (2017-11-10 16:44:20)  

## 2020-12-30 NOTE — Assessment & Plan Note (Signed)
End of a refill for the Kayexalate. To keep on hand so that we can use it as needed. We will also add a diuretic to his ARB.

## 2020-12-30 NOTE — Patient Instructions (Signed)
Stop the telmisartan and in its place I want you to take the losartan HCT.  Still 1 a day it should also be generic.  I want you to try this for a month and will see if your potassium stays more level.

## 2020-12-30 NOTE — Progress Notes (Signed)
Established Patient Office Visit  Subjective:  Patient ID: Roberto Fields, male    DOB: 1945/12/12  Age: 75 y.o. MRN: [id number]  CC: No chief complaint on file.   HPI [name] presents for follow-up hyperkalemia.  Recent labs from oncology showed elevated potassium level of 6.1. He has had this happen before with the potassium will spike up and then it has been normal in between though lately his potassium has been running in the low 5 range. He did take the Kayexalate for about 3 to 4 days in a row and then stopped it and has not taken any since then. Repeat potassium today was actually normal. He says that the Kayexalate is not mixing very well in the liquid and wonders if it could actually be old. He denies any major dietary changes. He denies any medication changes.  Follow-up hypertension-he reports home blood pressures are often times in the 130s once he rechecks it.  Past Medical History:  Diagnosis Date  . Arthritis   . Atrial fibrillation ([place])   . Blood transfusion without reported diagnosis   . BPH (benign prostatic hyperplasia)   . CAD (coronary artery disease)   . Cancer of sigmoid colon ([place]) 06/27/2020  . Cataract    bilateral cataracts removed  . COPD (chronic obstructive pulmonary disease) ([place])   . CVA (cerebral infarction)   . Dry eye   . 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 Glaucoma   . History of blood clots   . History of colon polyps   . Hyperlipidemia   . Hypertension   . Hypothyroid   . Iron deficiency anemia due to chronic blood loss 06/27/2020  . Myocardial infarction ([place]) 2010  . Renal insufficiency    stage 4   . Stomach ulcer   . Stroke ([place]) 2010  . Tuberculosis    at age 31  . Ulcer 1992   HX peptic ulcer- DX: by endoscopy    Past Surgical History:  Procedure Laterality Date  . arm surgery    . COLONOSCOPY     Dr Lavina Hamman in [place]. Dr Unk Lightning removed a larger polyp  . CORONARY STENT PLACEMENT  04/2011   [facility], [facility]  . ESOPHAGOGASTRODUODENOSCOPY     dr Lavina Hamman in [place]  . EYE SURGERY    . HERNIA MESH REMOVAL    . [place]  . TRANSURETHRAL RESECTION OF PROSTATE      Family History  Problem Relation Age of Onset  . Heart disease Father   . Heart attack Father   . Coronary artery disease Brother   . Heart attack Brother 36       pacemaker and defibrillator  . Coronary artery disease Brother   . Leukemia Mother   . Hypertension Other        family history of  . Heart disease Sister   . Stomach cancer Sister   . Breast cancer Sister   . Lung cancer Sister   . Breast cancer Sister   . Lung cancer Sister   . Colon cancer Neg Hx   . Esophageal cancer Neg Hx   . Rectal cancer Neg Hx     Social History   Socioeconomic History  . Marital status: Married    Spouse name: Not on file  . Number of children: 3  . Years of education: Not on file  . Highest education level: Not on file  Occupational History  .  Occupation: Retired  Tobacco Use  . Smoking status: Former Smoker    Quit date: 10/02/1984    Years since quitting: 36.2  . Smokeless tobacco: Never Used  Vaping Use  . Vaping Use: Never used  Substance and Sexual Activity  . Alcohol use: No  . Drug use: No  . Sexual activity: Not on file  Other Topics Concern  . Not on file  Social History Narrative  . Not on file   Social Determinants of Health   Financial Resource Strain: Not on file  Food Insecurity: Not on file  Transportation Needs: Not on file  Physical Activity: Not on file  Stress: Not on file  Social Connections: Not on file  Intimate Partner Violence: Not on file    Outpatient Medications Prior to Visit  Medication Sig Dispense Refill  . Acetaminophen (TYLENOL PO) Take by mouth daily as needed.     Marland Kitchen amLODipine (NORVASC) 10 MG tablet Take 1 tablet (10 mg total) by mouth daily. 90 tablet 3  . colchicine 0.6 MG tablet 2 tabs po on Day 1 of gout flare, then 1  tab QD until gout flare is over. 30 tablet 0  . Docusate Calcium (STOOL SOFTENER PO) Take by mouth as needed.    Marland Kitchen ELIQUIS 5 MG TABS tablet Take 1 tablet by mouth twice daily 180 tablet 1  . Ferrous Sulfate (IRON SUPPLEMENT PO) Take 1 tablet by mouth daily.    Marland Kitchen gabapentin (NEURONTIN) 300 MG capsule Take 600 mg by mouth 2 (two) times daily.     . Latanoprost (XELPROS) 0.005 % EMUL Apply 1 drop to eye at bedtime.    Marland Kitchen levothyroxine (EUTHYROX) 88 MCG tablet Take 1 tablet (88 mcg total) by mouth daily. 90 tablet 1  . niacin (NIASPAN) 500 MG CR tablet Take 500 mg by mouth daily.    . nitroGLYCERIN (NITROSTAT) 0.4 MG SL tablet DISSOLVE ONE TABLET UNDER THE TONGUE EVERY 5 MINUTES AS NEEDED FOR CHEST PAIN 100 tablet prn  . omeprazole (PRILOSEC) 40 MG capsule Take 1 capsule by mouth once daily 90 capsule 3  . REFRESH CELLUVISC 1 % GEL Apply 1 drop to eye 3 (three) times daily.     . timolol (TIMOPTIC) 0.5 % ophthalmic solution Place 1 drop into both eyes daily.     . vitamin B-12 (CYANOCOBALAMIN) 1000 MCG tablet Take 1 tablet by mouth daily.    Marland Kitchen telmisartan (MICARDIS) 20 MG tablet Take 1 tablet (20 mg total) by mouth daily. 90 tablet 0   No facility-administered medications prior to visit.    Allergies  Allergen Reactions  . Atorvastatin Other (See Comments)    REACTION: Muscle weakness  . Nsaids Other (See Comments)    Avoid with current kidney function.    . Colesevelam Other (See Comments)    REACTION: joint pain  . Pravastatin Other (See Comments)    Muscle aches  . Simvastatin Other (See Comments)    REACTION: Myalgias    ROS Review of Systems    Objective:    Physical Exam Constitutional:      Appearance: He is well-developed and well-nourished.  HENT:     Head: Normocephalic and atraumatic.  Cardiovascular:     Rate and Rhythm: Normal rate and regular rhythm.     Heart sounds: Normal heart sounds.  Pulmonary:     Effort: Pulmonary effort is normal.     Breath sounds:  Normal breath sounds.  Skin:    General: Skin is  warm and dry.  Neurological:     Mental Status: He is alert and oriented to person, place, and time.  Psychiatric:        Mood and Affect: Mood and affect normal.        Behavior: Behavior normal.     BP (!) 126/41   Pulse (!) 52   Wt 177 lb (80.3 kg)   SpO2 100%   BMI 26.91 kg/m  Wt Readings from Last 3 Encounters:  12/30/20 177 lb (80.3 kg)  10/28/20 167 lb (75.8 kg)  10/02/20 165 lb 1.9 oz (74.9 kg)     Health Maintenance Due  Topic Date Due  . COVID-19 Vaccine (3 - Moderna risk 4-dose series) 05/24/2020    There are no preventive care reminders to display for this patient.  Lab Results  Component Value Date   TSH 2.05 06/18/2020   Lab Results  Component Value Date   WBC 5.4 12/30/2020   HGB 10.7 (L) 12/30/2020   HCT 32.8 (L) 12/30/2020   MCV 101.2 (H) 12/30/2020   PLT 173 12/30/2020   Lab Results  Component Value Date   NA 140 12/30/2020   K 4.7 12/30/2020   CO2 26 12/30/2020   GLUCOSE 80 12/30/2020   BUN 27 (H) 12/30/2020   CREATININE 1.89 (H) 12/30/2020   BILITOT 0.4 12/30/2020   ALKPHOS 126 12/30/2020   AST 10 (L) 12/30/2020   ALT 9 12/30/2020   PROT 6.6 12/30/2020   ALBUMIN 4.2 12/30/2020   CALCIUM 9.6 12/30/2020   ANIONGAP 7 12/30/2020   GFR 36.14 (L) 08/19/2020   Lab Results  Component Value Date   CHOL 187 05/27/2020   Lab Results  Component Value Date   HDL 39 (L) 05/27/2020   Lab Results  Component Value Date   LDLCALC 123 (H) 05/27/2020   Lab Results  Component Value Date   TRIG 130 05/27/2020   Lab Results  Component Value Date   CHOLHDL 4.8 05/27/2020   Lab Results  Component Value Date   HGBA1C 5.5 08/26/2015      Assessment & Plan:   Problem List Items Addressed This Visit      Cardiovascular and Mediastinum   HYPERTENSION, BENIGN ESSENTIAL - Primary    Blood pressure actually looks good today. But I would like to try to combine his ARB with a diuretic to see  if we can make sure that this is not contributing to hyperkalemia. We will need to monitor blood pressure carefully.      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 To keep on hand so that we can use it as needed. We will also add a diuretic to his ARB.      Relevant Medications   sodium polystyrene (KAYEXALATE) powder     Need to have BMP rechecked in 1 to 2 weeks.  Meds ordered this encounter  Medications  . losartan-hydrochlorothiazide (HYZAAR) 50-12.5 MG tablet    Sig: Take 1 tablet by mouth daily.    Dispense:  90 tablet    Refill:  0  . sodium polystyrene (KAYEXALATE) powder    Sig: Take by mouth daily. 15 g PO QD x 1 week PRN    Dispense:  454 g    Refill:  0    Follow-up: No follow-ups on file.    Beatrice Lecher, MD

## 2020-12-31 ENCOUNTER — Telehealth: Payer: Self-pay

## 2020-12-31 ENCOUNTER — Encounter: Payer: Self-pay | Admitting: Family Medicine

## 2020-12-31 DIAGNOSIS — H18513 Endothelial corneal dystrophy, bilateral: Secondary | ICD-10-CM | POA: Diagnosis not present

## 2020-12-31 MED ORDER — SODIUM POLYSTYRENE SULFONATE 15 GM/60ML PO SUSP: 15.0000 g | Freq: Every day | ORAL | 0 refills | Status: DC

## 2020-12-31 NOTE — Telephone Encounter (Signed)
[facility] called and states they do not have the powder of the Kayexalate. They do however have the Kayexalate liquid. Please advise.   Pended liquid.

## 2020-12-31 NOTE — Telephone Encounter (Signed)
Scription sent.

## 2021-01-06 ENCOUNTER — Telehealth: Payer: Self-pay

## 2021-01-06 DIAGNOSIS — R519 Headache, unspecified: Secondary | ICD-10-CM | POA: Diagnosis not present

## 2021-01-06 DIAGNOSIS — Z8673 Personal history of transient ischemic attack (TIA), and cerebral infarction without residual deficits: Secondary | ICD-10-CM | POA: Diagnosis not present

## 2021-01-06 DIAGNOSIS — R42 Dizziness and giddiness: Secondary | ICD-10-CM | POA: Diagnosis not present

## 2021-01-06 NOTE — Telephone Encounter (Signed)
Which she able to check his blood pressure to see if maybe his blood pressure was low?  I would have him retry a half a tab of the losartan HCTZ to see how he tolerates that.  If he still feels weak then please let me know

## 2021-01-06 NOTE — Telephone Encounter (Signed)
Roberto Fields wife, called and left a message. She stated he has had weakness since starting the Losartan-HCTZ. He didn't take the Losartan-HCTZ and the weakness resolved. Please advise.

## 2021-01-07 NOTE — Telephone Encounter (Signed)
Spoke with Roberto Fields and advised her of recommendations. She stated he just took half a tab of Losartan and they will monitor him. She stated he would keep track of his blood pressure and let us know if it happens again.

## 2021-01-15 MED ORDER — TELMISARTAN 20 MG PO TABS: 20.0000 mg | ORAL_TABLET | Freq: Every day | ORAL | 1 refills | Status: DC

## 2021-01-15 NOTE — Telephone Encounter (Signed)
Roberto Fields's wife called and left a message stating he needs a refill on telmisartan. This is not on his medication list. She went on to say he was unable to take the Losartan-HCTZ. She states he went back on the telmisartan.   I called and left a message for a return call.

## 2021-01-15 NOTE — Telephone Encounter (Signed)
Roberto Fields's wife states he was not able to continue the Losartan-HCTZ due to weakness. He did restart the Telmisartan. He needs a refill. Please advise.

## 2021-01-15 NOTE — Addendum Note (Signed)
Addended by: Beatrice Lecher D on: 01/15/2021 03:59 PM   Modules accepted: Orders

## 2021-01-15 NOTE — Telephone Encounter (Signed)
Med sent.

## 2021-01-20 ENCOUNTER — Inpatient Hospital Stay (HOSPITAL_BASED_OUTPATIENT_CLINIC_OR_DEPARTMENT_OTHER): Payer: PPO | Admitting: Family

## 2021-01-20 ENCOUNTER — Encounter: Payer: Self-pay | Admitting: Family

## 2021-01-20 ENCOUNTER — Inpatient Hospital Stay: Payer: PPO | Attending: Hematology & Oncology

## 2021-01-20 ENCOUNTER — Other Ambulatory Visit: Payer: Self-pay | Admitting: Cardiology

## 2021-01-20 ENCOUNTER — Other Ambulatory Visit: Payer: Self-pay

## 2021-01-20 ENCOUNTER — Inpatient Hospital Stay: Payer: PPO

## 2021-01-20 VITALS — BP 116/44 | HR 52 | Temp 97.7°F | Resp 18 | Ht 68.0 in | Wt 169.1 lb

## 2021-01-20 DIAGNOSIS — Z79899 Other long term (current) drug therapy: Secondary | ICD-10-CM | POA: Diagnosis not present

## 2021-01-20 DIAGNOSIS — D631 Anemia in chronic kidney disease: Secondary | ICD-10-CM | POA: Insufficient documentation

## 2021-01-20 DIAGNOSIS — N289 Disorder of kidney and ureter, unspecified: Secondary | ICD-10-CM | POA: Diagnosis not present

## 2021-01-20 DIAGNOSIS — C187 Malignant neoplasm of sigmoid colon: Secondary | ICD-10-CM

## 2021-01-20 DIAGNOSIS — Z85038 Personal history of other malignant neoplasm of large intestine: Secondary | ICD-10-CM | POA: Diagnosis not present

## 2021-01-20 DIAGNOSIS — I480 Paroxysmal atrial fibrillation: Secondary | ICD-10-CM | POA: Insufficient documentation

## 2021-01-20 DIAGNOSIS — D500 Iron deficiency anemia secondary to blood loss (chronic): Secondary | ICD-10-CM

## 2021-01-20 DIAGNOSIS — Z7901 Long term (current) use of anticoagulants: Secondary | ICD-10-CM | POA: Diagnosis not present

## 2021-01-20 DIAGNOSIS — I10 Essential (primary) hypertension: Secondary | ICD-10-CM | POA: Insufficient documentation

## 2021-01-20 LAB — CBC WITH DIFFERENTIAL (CANCER CENTER ONLY)
Abs Immature Granulocytes: 0.04 10*3/uL (ref 0.00–0.07)
Basophils Absolute: 0 10*3/uL (ref 0.0–0.1)
Basophils Relative: 0 %
Eosinophils Absolute: 0.1 10*3/uL (ref 0.0–0.5)
Eosinophils Relative: 1 %
HCT: 35 % — ABNORMAL LOW (ref 39.0–52.0)
Hemoglobin: 11 g/dL — ABNORMAL LOW (ref 13.0–17.0)
Immature Granulocytes: 1 %
Lymphocytes Relative: 27 %
Lymphs Abs: 1.3 10*3/uL (ref 0.7–4.0)
MCH: 32.3 pg (ref 26.0–34.0)
MCHC: 31.4 g/dL (ref 30.0–36.0)
MCV: 102.6 fL — ABNORMAL HIGH (ref 80.0–100.0)
Monocytes Absolute: 0.5 10*3/uL (ref 0.1–1.0)
Monocytes Relative: 10 %
Neutro Abs: 3 10*3/uL (ref 1.7–7.7)
Neutrophils Relative %: 61 %
Platelet Count: 182 10*3/uL (ref 150–400)
RBC: 3.41 MIL/uL — ABNORMAL LOW (ref 4.22–5.81)
RDW: 13.2 % (ref 11.5–15.5)
WBC Count: 4.9 10*3/uL (ref 4.0–10.5)
nRBC: 0 % (ref 0.0–0.2)

## 2021-01-20 LAB — CMP (CANCER CENTER ONLY)
ALT: 8 U/L (ref 0–44)
AST: 10 U/L — ABNORMAL LOW (ref 15–41)
Albumin: 4.4 g/dL (ref 3.5–5.0)
Alkaline Phosphatase: 105 U/L (ref 38–126)
Anion gap: 6 (ref 5–15)
BUN: 39 mg/dL — ABNORMAL HIGH (ref 8–23)
CO2: 26 mmol/L (ref 22–32)
Calcium: 9.8 mg/dL (ref 8.9–10.3)
Chloride: 109 mmol/L (ref 98–111)
Creatinine: 2.35 mg/dL — ABNORMAL HIGH (ref 0.61–1.24)
GFR, Estimated: 28 mL/min — ABNORMAL LOW (ref >60)
Glucose, Bld: 103 mg/dL — ABNORMAL HIGH (ref 70–99)
Potassium: 5.8 mmol/L — ABNORMAL HIGH (ref 3.5–5.1)
Sodium: 141 mmol/L (ref 135–145)
Total Bilirubin: 0.5 mg/dL (ref 0.3–1.2)
Total Protein: 6.5 g/dL (ref 6.5–8.1)

## 2021-01-20 LAB — SAMPLE TO BLOOD BANK

## 2021-01-20 LAB — RETICULOCYTES
Immature Retic Fract: 5.7 % (ref 2.3–15.9)
RBC.: 3.39 MIL/uL — ABNORMAL LOW (ref 4.22–5.81)
Retic Count, Absolute: 60.7 10*3/uL (ref 19.0–186.0)
Retic Ct Pct: 1.8 % (ref 0.4–3.1)

## 2021-01-20 NOTE — Progress Notes (Signed)
Hematology and Oncology Follow Up Visit  Roberto Fields [id number] 1946/02/10 75 y.o. 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 He has had some fatigue and occasional dizziness. He states that the dizziness is due to vertigo.  His PCP recently changed his HTN medication regimen due to hypertension. He states that they added Micardis back into his daily routine.  He has intermittent episodes of chest pain and states that his PCP is aware. He takes a nitro tablet as needed which does help.  He has not noted any blood loss. No bruising or petechiae.  Fever, chills, n/v, cough, rash, SOB, chest pa palpitations, abdominal pain or changes in bowel or bladder habits.  No swelling, tenderness, numbness or tingling in his extremities at this time. He has occasional puffiness in his ankles that comes and goes.  No falls or syncope recently. He has maintained a good appetite and feels that he is staying properly hydrated. His weight is stable at 169 lbs.   ECOG Performance Status: 1 - Symptomatic but completely ambulatory  Medications:  Allergies as of 01/20/2021      Reactions   Atorvastatin Other (See Comments)   REACTION: Muscle weakness   Nsaids Other (See Comments)   Avoid with current kidney function.     Colesevelam Other (See Comments)   REACTION: joint pain   Pravastatin Other (See Comments)   Muscle aches   Simvastatin Other (See Comments)   REACTION: Myalgias      Medication List       Accurate as of January 20, 2021  3:09 PM. If  you have any questions, ask your nurse or doctor.        amLODipine 10 MG tablet Commonly known as: NORVASC Take 1 tablet (10 mg total) by mouth daily.   colchicine 0.6 MG tablet 2 tabs po on Day 1 of gout flare, then 1 tab QD until gout flare is over.   Eliquis 5 MG Tabs tablet Generic drug: apixaban Take 1 tablet by mouth twice daily   gabapentin 300 MG capsule Commonly known as: NEURONTIN Take 600 mg by mouth 2 (two) times daily.   IRON SUPPLEMENT PO Take 1 tablet by mouth daily.   levothyroxine 88 MCG tablet Commonly known as: Euthyrox Take 1 tablet (88 mcg total) by mouth daily.   niacin 500 MG CR tablet Commonly known as: NIASPAN Take 500 mg by mouth daily.   nitroGLYCERIN 0.4 MG SL tablet Commonly known as: NITROSTAT DISSOLVE ONE TABLET UNDER THE TONGUE EVERY 5 MINUTES AS NEEDED FOR CHEST PAIN   omeprazole 40 MG capsule Commonly known as: PRILOSEC Take 1 capsule by mouth once daily   Refresh Celluvisc 1 % Gel Generic drug: Carboxymethylcellulose Sod PF Apply 1 drop to eye 3 (three) times daily.   sodium polystyrene 15 GM/60ML suspension Commonly known as: KAYEXALATE Take 60 mLs (15 g total) by mouth daily.   STOOL SOFTENER PO Take by mouth as needed.   telmisartan 20 MG tablet Commonly known as: MICARDIS Take 1 tablet (20 mg total) by mouth daily.   timolol 0.5 %  ophthalmic solution Commonly known as: TIMOPTIC Place 1 drop into both eyes daily.   TYLENOL PO Take by mouth daily as needed.   vitamin B-12 1000 MCG tablet Commonly known as: CYANOCOBALAMIN Take 1 tablet by mouth daily.   Xelpros 0.005 % Emul Generic drug: Latanoprost Apply 1 drop to eye at bedtime.       Allergies:  Allergies  Allergen Reactions  . Atorvastatin Other (See Comments)    REACTION: Muscle weakness  . Nsaids Other (See Comments)    Avoid with current kidney function.    . Colesevelam Other (See Comments)    REACTION: joint pain  . Pravastatin Other (See  Comments)    Muscle aches  . Simvastatin Other (See Comments)    REACTION: Myalgias    Past Medical History, Surgical history, Social history, and Family History were reviewed and updated.  Review of Systems: All other 10 point review of systems is negative.   Physical Exam:  height is 5\' 8"  (1.727 m) and weight is 169 lb 1.9 oz (76.7 kg). His oral temperature is 97.7 F (36.5 C). His blood pressure is 116/44 (abnormal) and his pulse is 52 (abnormal). His respiration is 18 and oxygen saturation is 100%.   Wt Readings from Last 3 Encounters:  01/20/21 169 lb 1.9 oz (76.7 kg)  12/30/20 177 lb (80.3 kg)  10/28/20 167 lb (75.8 kg)    Ocular: Sclerae unicteric, pupils equal, round and reactive to light Ear-nose-throat: Oropharynx clear, dentition fair Lymphatic: No cervical or supraclavicular adenopathy Lungs no rales or rhonchi, good excursion bilaterally Heart regular rate and rhythm, no murmur appreciated Abd soft, nontender, positive bowel sounds MSK no focal spinal tenderness, no joint edema Neuro: non-focal, well-oriented, appropriate affect Breasts: Deferred   Lab Results  Component Value Date   WBC 4.9 01/20/2021   HGB 11.0 (L) 01/20/2021   HCT 35.0 (L) 01/20/2021   MCV 102.6 (H) 01/20/2021   PLT 182 01/20/2021   Lab Results  Component Value Date   FERRITIN 70 10/28/2020   IRON 134 10/28/2020   TIBC 361 10/28/2020   UIBC 227 10/28/2020   IRONPCTSAT 37 10/28/2020   Lab Results  Component Value Date   RETICCTPCT 1.8 01/20/2021   RBC 3.39 (L) 01/20/2021   Lab Results  Component Value Date   KPAFRELGTCHN 64.7 (H) 06/27/2020   LAMBDASER 46.3 (H) 06/27/2020   KAPLAMBRATIO 1.40 06/27/2020   Lab Results  Component Value Date   IGGSERUM 649 06/27/2020   IGA 223 06/27/2020   IGMSERUM 140 06/27/2020   Lab Results  Component Value Date   TOTALPROTELP 6.3 06/27/2020     Chemistry      Component Value Date/Time   NA 141 01/20/2021 1348   NA 142 01/05/2017  0000   K 5.8 (H) 01/20/2021 1348   CL 109 01/20/2021 1348   CO2 26 01/20/2021 1348   BUN 39 (H) 01/20/2021 1348   BUN 24 (A) 01/05/2017 0000   CREATININE 2.35 (H) 01/20/2021 1348   CREATININE 1.66 (H) 09/03/2020 1143   GLU 95 01/05/2017 0000      Component Value Date/Time   CALCIUM 9.8 01/20/2021 1348   CALCIUM 9.5 09/19/2014 0000   ALKPHOS 105 01/20/2021 1348   AST 10 (L) 01/20/2021 1348   ALT 8 01/20/2021 1348   BILITOT 0.5 01/20/2021 1348       Impression and Plan: Roberto Fields is a very pleasant 75 yo caucasian gentleman with multifactorial anemia as well as history of colon  cancer treated with resection in November 2020. Hgb is stable at 11. No ESA needed this visit.  Iron studies are pending. We will replace if needed.  We will continue to check his labs every 3 weeks and do his injection if needed.  Follow-up in July 2022.  They are in agreement with this and will contact our office with any questions or concerns.   Laverna Peace, NP 3/14/20223:09 PM

## 2021-01-20 NOTE — Progress Notes (Signed)
Notified Laverna Peace of K of 5.8.

## 2021-01-21 LAB — IRON AND TIBC
Iron: 143 ug/dL (ref 42–163)
Saturation Ratios: 42 % (ref 20–55)
TIBC: 343 ug/dL (ref 202–409)
UIBC: 200 ug/dL (ref 117–376)

## 2021-01-21 LAB — FERRITIN: Ferritin: 79 ng/mL (ref 24–336)

## 2021-01-29 DIAGNOSIS — H16142 Punctate keratitis, left eye: Secondary | ICD-10-CM | POA: Diagnosis not present

## 2021-01-29 DIAGNOSIS — H35361 Drusen (degenerative) of macula, right eye: Secondary | ICD-10-CM | POA: Diagnosis not present

## 2021-01-29 DIAGNOSIS — H35033 Hypertensive retinopathy, bilateral: Secondary | ICD-10-CM | POA: Diagnosis not present

## 2021-01-29 DIAGNOSIS — H35373 Puckering of macula, bilateral: Secondary | ICD-10-CM | POA: Diagnosis not present

## 2021-02-06 ENCOUNTER — Telehealth: Payer: Self-pay

## 2021-02-06 NOTE — Telephone Encounter (Signed)
pts wife called in to r/s the 4/4 appts to 4/6, done   Roberto Fields

## 2021-02-10 ENCOUNTER — Other Ambulatory Visit: Payer: PPO

## 2021-02-10 ENCOUNTER — Ambulatory Visit: Payer: PPO

## 2021-02-12 ENCOUNTER — Inpatient Hospital Stay: Payer: PPO

## 2021-02-12 ENCOUNTER — Inpatient Hospital Stay: Payer: PPO | Attending: Hematology & Oncology

## 2021-02-12 ENCOUNTER — Other Ambulatory Visit: Payer: Self-pay

## 2021-02-12 VITALS — BP 118/43 | HR 57 | Temp 98.1°F | Resp 18

## 2021-02-12 DIAGNOSIS — N289 Disorder of kidney and ureter, unspecified: Secondary | ICD-10-CM | POA: Insufficient documentation

## 2021-02-12 DIAGNOSIS — Z85038 Personal history of other malignant neoplasm of large intestine: Secondary | ICD-10-CM | POA: Diagnosis not present

## 2021-02-12 DIAGNOSIS — C187 Malignant neoplasm of sigmoid colon: Secondary | ICD-10-CM

## 2021-02-12 DIAGNOSIS — D500 Iron deficiency anemia secondary to blood loss (chronic): Secondary | ICD-10-CM

## 2021-02-12 DIAGNOSIS — D631 Anemia in chronic kidney disease: Secondary | ICD-10-CM | POA: Diagnosis not present

## 2021-02-12 LAB — CBC WITH DIFFERENTIAL (CANCER CENTER ONLY)
Abs Immature Granulocytes: 0.02 10*3/uL (ref 0.00–0.07)
Basophils Absolute: 0 10*3/uL (ref 0.0–0.1)
Basophils Relative: 0 %
Eosinophils Absolute: 0.1 10*3/uL (ref 0.0–0.5)
Eosinophils Relative: 2 %
HCT: 29.8 % — ABNORMAL LOW (ref 39.0–52.0)
Hemoglobin: 9.7 g/dL — ABNORMAL LOW (ref 13.0–17.0)
Immature Granulocytes: 0 %
Lymphocytes Relative: 31 %
Lymphs Abs: 1.5 10*3/uL (ref 0.7–4.0)
MCH: 32.9 pg (ref 26.0–34.0)
MCHC: 32.6 g/dL (ref 30.0–36.0)
MCV: 101 fL — ABNORMAL HIGH (ref 80.0–100.0)
Monocytes Absolute: 0.4 10*3/uL (ref 0.1–1.0)
Monocytes Relative: 9 %
Neutro Abs: 2.7 10*3/uL (ref 1.7–7.7)
Neutrophils Relative %: 58 %
Platelet Count: 147 10*3/uL — ABNORMAL LOW (ref 150–400)
RBC: 2.95 MIL/uL — ABNORMAL LOW (ref 4.22–5.81)
RDW: 13.2 % (ref 11.5–15.5)
WBC Count: 4.7 10*3/uL (ref 4.0–10.5)
nRBC: 0 % (ref 0.0–0.2)

## 2021-02-12 LAB — CMP (CANCER CENTER ONLY)
ALT: 8 U/L (ref 0–44)
AST: 12 U/L — ABNORMAL LOW (ref 15–41)
Albumin: 4 g/dL (ref 3.5–5.0)
Alkaline Phosphatase: 100 U/L (ref 38–126)
Anion gap: 5 (ref 5–15)
BUN: 29 mg/dL — ABNORMAL HIGH (ref 8–23)
CO2: 28 mmol/L (ref 22–32)
Calcium: 9.5 mg/dL (ref 8.9–10.3)
Chloride: 107 mmol/L (ref 98–111)
Creatinine: 2.09 mg/dL — ABNORMAL HIGH (ref 0.61–1.24)
GFR, Estimated: 32 mL/min — ABNORMAL LOW (ref >60)
Glucose, Bld: 109 mg/dL — ABNORMAL HIGH (ref 70–99)
Potassium: 5.7 mmol/L — ABNORMAL HIGH (ref 3.5–5.1)
Sodium: 140 mmol/L (ref 135–145)
Total Bilirubin: 0.5 mg/dL (ref 0.3–1.2)
Total Protein: 6.2 g/dL — ABNORMAL LOW (ref 6.5–8.1)

## 2021-02-12 MED ORDER — DARBEPOETIN ALFA 300 MCG/0.6ML IJ SOSY
300.0000 ug | PREFILLED_SYRINGE | Freq: Once | INTRAMUSCULAR | Status: AC
  Administered 2021-02-12: 300 ug via SUBCUTANEOUS

## 2021-02-12 MED ORDER — DARBEPOETIN ALFA 300 MCG/0.6ML IJ SOSY
PREFILLED_SYRINGE | INTRAMUSCULAR | Status: AC
  Filled 2021-02-12: qty 0.6

## 2021-02-12 NOTE — Patient Instructions (Signed)
Darbepoetin Alfa injection What is this medicine? DARBEPOETIN ALFA (dar be POE e tin AL fa) helps your body make more red blood cells. It is used to treat anemia caused by chronic kidney failure and chemotherapy. This medicine may be used for other purposes; ask your health care provider or pharmacist if you have questions. COMMON BRAND NAME(S): Aranesp What should I tell my health care provider before I take this medicine? They need to know if you have any of these conditions:  blood clotting disorders or history of blood clots  cancer patient not on chemotherapy  cystic fibrosis  heart disease, such as angina, heart failure, or a history of a heart attack  hemoglobin level of 12 g/dL or greater  high blood pressure  low levels of folate, iron, or vitamin B12  seizures  an unusual or allergic reaction to darbepoetin, erythropoietin, albumin, hamster proteins, latex, other medicines, foods, dyes, or preservatives  pregnant or trying to get pregnant  breast-feeding How should I use this medicine? This medicine is for injection into a vein or under the skin. It is usually given by a health care professional in a hospital or clinic setting. If you get this medicine at home, you will be taught how to prepare and give this medicine. Use exactly as directed. Take your medicine at regular intervals. Do not take your medicine more often than directed. It is important that you put your used needles and syringes in a special sharps container. Do not put them in a trash can. If you do not have a sharps container, call your pharmacist or healthcare provider to get one. A special MedGuide will be given to you by the pharmacist with each prescription and refill. Be sure to read this information carefully each time. Talk to your pediatrician regarding the use of this medicine in children. While this medicine may be used in children as young as 1 month of age for selected conditions, precautions do  apply. Overdosage: If you think you have taken too much of this medicine contact a poison control center or emergency room at once. NOTE: This medicine is only for you. Do not share this medicine with others. What if I miss a dose? If you miss a dose, take it as soon as you can. If it is almost time for your next dose, take only that dose. Do not take double or extra doses. What may interact with this medicine? Do not take this medicine with any of the following medications:  epoetin alfa This list may not describe all possible interactions. Give your health care provider a list of all the medicines, herbs, non-prescription drugs, or dietary supplements you use. Also tell them if you smoke, drink alcohol, or use illegal drugs. Some items may interact with your medicine. What should I watch for while using this medicine? Your condition will be monitored carefully while you are receiving this medicine. You may need blood work done while you are taking this medicine. This medicine may cause a decrease in vitamin B6. You should make sure that you get enough vitamin B6 while you are taking this medicine. Discuss the foods you eat and the vitamins you take with your health care professional. What side effects may I notice from receiving this medicine? Side effects that you should report to your doctor or health care professional as soon as possible:  allergic reactions like skin rash, itching or hives, swelling of the face, lips, or tongue  breathing problems  changes in   vision  chest pain  confusion, trouble speaking or understanding  feeling faint or lightheaded, falls  high blood pressure  muscle aches or pains  pain, swelling, warmth in the leg  rapid weight gain  severe headaches  sudden numbness or weakness of the face, arm or leg  trouble walking, dizziness, loss of balance or coordination  seizures (convulsions)  swelling of the ankles, feet, hands  unusually weak or  tired Side effects that usually do not require medical attention (report to your doctor or health care professional if they continue or are bothersome):  diarrhea  fever, chills (flu-like symptoms)  headaches  nausea, vomiting  redness, stinging, or swelling at site where injected This list may not describe all possible side effects. Call your doctor for medical advice about side effects. You may report side effects to FDA at 1-800-FDA-1088. Where should I keep my medicine? Keep out of the reach of children. Store in a refrigerator between 2 and 8 degrees C (36 and 46 degrees F). Do not freeze. Do not shake. Throw away any unused portion if using a single-dose vial. Throw away any unused medicine after the expiration date. NOTE: This sheet is a summary. It may not cover all possible information. If you have questions about this medicine, talk to your doctor, pharmacist, or health care provider.  2021 Elsevier/Gold Standard (2017-11-10 16:44:20)  

## 2021-03-03 ENCOUNTER — Inpatient Hospital Stay: Payer: PPO

## 2021-03-03 ENCOUNTER — Other Ambulatory Visit: Payer: Self-pay

## 2021-03-03 VITALS — BP 124/46 | HR 54 | Temp 98.0°F | Resp 18

## 2021-03-03 DIAGNOSIS — C187 Malignant neoplasm of sigmoid colon: Secondary | ICD-10-CM

## 2021-03-03 DIAGNOSIS — D631 Anemia in chronic kidney disease: Secondary | ICD-10-CM

## 2021-03-03 DIAGNOSIS — D500 Iron deficiency anemia secondary to blood loss (chronic): Secondary | ICD-10-CM

## 2021-03-03 DIAGNOSIS — N289 Disorder of kidney and ureter, unspecified: Secondary | ICD-10-CM | POA: Diagnosis not present

## 2021-03-03 LAB — CMP (CANCER CENTER ONLY)
ALT: 7 U/L (ref 0–44)
AST: 10 U/L — ABNORMAL LOW (ref 15–41)
Albumin: 4.2 g/dL (ref 3.5–5.0)
Alkaline Phosphatase: 93 U/L (ref 38–126)
Anion gap: 7 (ref 5–15)
BUN: 35 mg/dL — ABNORMAL HIGH (ref 8–23)
CO2: 24 mmol/L (ref 22–32)
Calcium: 9.4 mg/dL (ref 8.9–10.3)
Chloride: 107 mmol/L (ref 98–111)
Creatinine: 2.29 mg/dL — ABNORMAL HIGH (ref 0.61–1.24)
GFR, Estimated: 29 mL/min — ABNORMAL LOW (ref >60)
Glucose, Bld: 98 mg/dL (ref 70–99)
Potassium: 5.3 mmol/L — ABNORMAL HIGH (ref 3.5–5.1)
Sodium: 138 mmol/L (ref 135–145)
Total Bilirubin: 0.6 mg/dL (ref 0.3–1.2)
Total Protein: 6.5 g/dL (ref 6.5–8.1)

## 2021-03-03 LAB — CBC WITH DIFFERENTIAL (CANCER CENTER ONLY)
Abs Immature Granulocytes: 0.01 10*3/uL (ref 0.00–0.07)
Basophils Absolute: 0 10*3/uL (ref 0.0–0.1)
Basophils Relative: 0 %
Eosinophils Absolute: 0.1 10*3/uL (ref 0.0–0.5)
Eosinophils Relative: 2 %
HCT: 33.1 % — ABNORMAL LOW (ref 39.0–52.0)
Hemoglobin: 10.7 g/dL — ABNORMAL LOW (ref 13.0–17.0)
Immature Granulocytes: 0 %
Lymphocytes Relative: 28 %
Lymphs Abs: 1.4 10*3/uL (ref 0.7–4.0)
MCH: 33.1 pg (ref 26.0–34.0)
MCHC: 32.3 g/dL (ref 30.0–36.0)
MCV: 102.5 fL — ABNORMAL HIGH (ref 80.0–100.0)
Monocytes Absolute: 0.5 10*3/uL (ref 0.1–1.0)
Monocytes Relative: 10 %
Neutro Abs: 2.8 10*3/uL (ref 1.7–7.7)
Neutrophils Relative %: 60 %
Platelet Count: 165 10*3/uL (ref 150–400)
RBC: 3.23 MIL/uL — ABNORMAL LOW (ref 4.22–5.81)
RDW: 14.5 % (ref 11.5–15.5)
WBC Count: 4.8 10*3/uL (ref 4.0–10.5)
nRBC: 0 % (ref 0.0–0.2)

## 2021-03-03 MED ORDER — DARBEPOETIN ALFA 300 MCG/0.6ML IJ SOSY
PREFILLED_SYRINGE | INTRAMUSCULAR | Status: AC
  Filled 2021-03-03: qty 0.6

## 2021-03-03 MED ORDER — DARBEPOETIN ALFA 300 MCG/0.6ML IJ SOSY
300.0000 ug | PREFILLED_SYRINGE | Freq: Once | INTRAMUSCULAR | Status: AC
  Administered 2021-03-03: 300 ug via SUBCUTANEOUS

## 2021-03-03 NOTE — Patient Instructions (Addendum)
Darbepoetin Alfa injection What is this medicine? DARBEPOETIN ALFA (dar be POE e tin AL fa) helps your body make more red blood cells. It is used to treat anemia caused by chronic kidney failure and chemotherapy. This medicine may be used for other purposes; ask your health care provider or pharmacist if you have questions. COMMON BRAND NAME(S): Aranesp What should I tell my health care provider before I take this medicine? They need to know if you have any of these conditions:  blood clotting disorders or history of blood clots  cancer patient not on chemotherapy  cystic fibrosis  heart disease, such as angina, heart failure, or a history of a heart attack  hemoglobin level of 12 g/dL or greater  high blood pressure  low levels of folate, iron, or vitamin B12  seizures  an unusual or allergic reaction to darbepoetin, erythropoietin, albumin, hamster proteins, latex, other medicines, foods, dyes, or preservatives  pregnant or trying to get pregnant  breast-feeding How should I use this medicine? This medicine is for injection into a vein or under the skin. It is usually given by a health care professional in a hospital or clinic setting. If you get this medicine at home, you will be taught how to prepare and give this medicine. Use exactly as directed. Take your medicine at regular intervals. Do not take your medicine more often than directed. It is important that you put your used needles and syringes in a special sharps container. Do not put them in a trash can. If you do not have a sharps container, call your pharmacist or healthcare provider to get one. A special MedGuide will be given to you by the pharmacist with each prescription and refill. Be sure to read this information carefully each time. Talk to your pediatrician regarding the use of this medicine in children. While this medicine may be used in children as young as 1 month of age for selected conditions, precautions do  apply. Overdosage: If you think you have taken too much of this medicine contact a poison control center or emergency room at once. NOTE: This medicine is only for you. Do not share this medicine with others. What if I miss a dose? If you miss a dose, take it as soon as you can. If it is almost time for your next dose, take only that dose. Do not take double or extra doses. What may interact with this medicine? Do not take this medicine with any of the following medications:  epoetin alfa This list may not describe all possible interactions. Give your health care provider a list of all the medicines, herbs, non-prescription drugs, or dietary supplements you use. Also tell them if you smoke, drink alcohol, or use illegal drugs. Some items may interact with your medicine. What should I watch for while using this medicine? Your condition will be monitored carefully while you are receiving this medicine. You may need blood work done while you are taking this medicine. This medicine may cause a decrease in vitamin B6. You should make sure that you get enough vitamin B6 while you are taking this medicine. Discuss the foods you eat and the vitamins you take with your health care professional. What side effects may I notice from receiving this medicine? Side effects that you should report to your doctor or health care professional as soon as possible:  allergic reactions like skin rash, itching or hives, swelling of the face, lips, or tongue  breathing problems  changes in   vision  chest pain  confusion, trouble speaking or understanding  feeling faint or lightheaded, falls  high blood pressure  muscle aches or pains  pain, swelling, warmth in the leg  rapid weight gain  severe headaches  sudden numbness or weakness of the face, arm or leg  trouble walking, dizziness, loss of balance or coordination  seizures (convulsions)  swelling of the ankles, feet, hands  unusually weak or  tired Side effects that usually do not require medical attention (report to your doctor or health care professional if they continue or are bothersome):  diarrhea  fever, chills (flu-like symptoms)  headaches  nausea, vomiting  redness, stinging, or swelling at site where injected This list may not describe all possible side effects. Call your doctor for medical advice about side effects. You may report side effects to FDA at 1-800-FDA-1088. Where should I keep my medicine? Keep out of the reach of children. Store in a refrigerator between 2 and 8 degrees C (36 and 46 degrees F). Do not freeze. Do not shake. Throw away any unused portion if using a single-dose vial. Throw away any unused medicine after the expiration date. NOTE: This sheet is a summary. It may not cover all possible information. If you have questions about this medicine, talk to your doctor, pharmacist, or health care provider.  2021 Elsevier/Gold Standard (2017-11-10 16:44:20)  

## 2021-03-04 ENCOUNTER — Encounter: Payer: Self-pay | Admitting: Family Medicine

## 2021-03-04 ENCOUNTER — Ambulatory Visit (INDEPENDENT_AMBULATORY_CARE_PROVIDER_SITE_OTHER): Payer: PPO | Admitting: Family Medicine

## 2021-03-04 ENCOUNTER — Telehealth: Payer: Self-pay | Admitting: Family Medicine

## 2021-03-04 VITALS — BP 122/58 | HR 65 | Ht 68.0 in | Wt 170.0 lb

## 2021-03-04 DIAGNOSIS — E039 Hypothyroidism, unspecified: Secondary | ICD-10-CM | POA: Diagnosis not present

## 2021-03-04 DIAGNOSIS — I480 Paroxysmal atrial fibrillation: Secondary | ICD-10-CM | POA: Diagnosis not present

## 2021-03-04 DIAGNOSIS — I209 Angina pectoris, unspecified: Secondary | ICD-10-CM

## 2021-03-04 DIAGNOSIS — I10 Essential (primary) hypertension: Secondary | ICD-10-CM | POA: Diagnosis not present

## 2021-03-04 DIAGNOSIS — R0602 Shortness of breath: Secondary | ICD-10-CM | POA: Diagnosis not present

## 2021-03-04 DIAGNOSIS — N184 Chronic kidney disease, stage 4 (severe): Secondary | ICD-10-CM | POA: Diagnosis not present

## 2021-03-04 DIAGNOSIS — M109 Gout, unspecified: Secondary | ICD-10-CM | POA: Diagnosis not present

## 2021-03-04 LAB — URIC ACID: Uric Acid, Serum: 9.2 mg/dL — ABNORMAL HIGH (ref 4.0–8.0)

## 2021-03-04 LAB — TSH: TSH: 0.92 mIU/L (ref 0.40–4.50)

## 2021-03-04 NOTE — Telephone Encounter (Signed)
I am messaging Edmon Crape with Cardiology right now and checking to see if it possible to get him moved up

## 2021-03-04 NOTE — Progress Notes (Signed)
Established Patient Office Visit  Subjective:  Patient ID: Roberto Fields, male    DOB: May 06, 1946  Age: 75 y.o. MRN: [id number]  CC:  Chief Complaint  Patient presents with  . Annual Exam    HPI Roberto Fields presents for 6 mo f/u   Hypertension- Pt denies chest pain, SOB, dizziness, or heart palpitations.  Taking meds as directed w/o problems.  Denies medication side effects.    F/U CKD 3 -no recent changes.  Hypothyroidism - Taking medication regularly in the AM away from food and vitamins, etc. No recent change to skin, hair, or energy levels.  Does report feeling lightheaded he says normally he notices it when he has been up and doing a lot of activities outdoors and then he comes back inside and then he will actually feel a little bit lightheaded.  He says its not when he first stands up.  He will get a little winded as well.  He does get occasional chest pain but says is not necessarily with the lightheadedness that can happen at other times.  In fact he does occasionally get chest pain and uses his nitroglycerin.  He says sometimes he may use 3 tabs in 1 day and then he may not use it for a couple of weeks or he might use it a couple of days later.  Past Medical History:  Diagnosis Date  . Arthritis   . Atrial fibrillation ([place])   . Blood transfusion without reported diagnosis   . BPH (benign prostatic hyperplasia)   . CAD (coronary artery disease)   . Cancer of sigmoid colon ([place]) 06/27/2020  . Cataract    bilateral cataracts removed  . COPD (chronic obstructive pulmonary disease) ([place])   . CVA (cerebral infarction)   . Dry eye   . 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 Glaucoma   . History of blood clots   . History of colon polyps   . Hyperlipidemia   . Hypertension   . Hypothyroid   . Iron deficiency anemia due to chronic blood loss 06/27/2020  . Myocardial infarction ([place]) 2010  . Renal insufficiency    stage 4   . Stomach ulcer   . Stroke ([place]) 2010   . Tuberculosis    at age 39  . Ulcer 1992   HX peptic ulcer- DX: by endoscopy    Past Surgical History:  Procedure Laterality Date  . arm surgery    . COLONOSCOPY     Dr Lavina Hamman in [place]. Dr Unk Lightning removed a larger polyp  . CORONARY STENT PLACEMENT  04/2011   [facility], [facility]  . ESOPHAGOGASTRODUODENOSCOPY     dr Lavina Hamman in [place]  . EYE SURGERY    . HERNIA MESH REMOVAL    . [place]  . TRANSURETHRAL RESECTION OF PROSTATE      Family History  Problem Relation Age of Onset  . Heart disease Father   . Heart attack Father   . Coronary artery disease Brother   . Heart attack Brother 36       pacemaker and defibrillator  . Coronary artery disease Brother   . Leukemia Mother   . Hypertension Other        family history of  . Heart disease Sister   . Stomach cancer Sister   . Breast cancer Sister   . Lung cancer Sister   . Breast cancer Sister   . Lung cancer Sister   .  Colon cancer Neg Hx   . Esophageal cancer Neg Hx   . Rectal cancer Neg Hx     Social History   Socioeconomic History  . Marital status: Married    Spouse name: Not on file  . Number of children: 3  . Years of education: Not on file  . Highest education level: Not on file  Occupational History  . Occupation: Retired  Tobacco Use  . Smoking status: Former Smoker    Quit date: 10/02/1984    Years since quitting: 36.4  . Smokeless tobacco: Never Used  Vaping Use  . Vaping Use: Never used  Substance and Sexual Activity  . Alcohol use: No  . Drug use: No  . Sexual activity: Not on file  Other Topics Concern  . Not on file  Social History Narrative  . Not on file   Social Determinants of Health   Financial Resource Strain: Not on file  Food Insecurity: Not on file  Transportation Needs: Not on file  Physical Activity: Not on file  Stress: Not on file  Social Connections: Not on file  Intimate Partner Violence: Not on file    Outpatient  Medications Prior to Visit  Medication Sig Dispense Refill  . Acetaminophen (TYLENOL PO) Take by mouth daily as needed.     Marland Kitchen amLODipine (NORVASC) 10 MG tablet Take 1 tablet (10 mg total) by mouth daily. 90 tablet 3  . colchicine 0.6 MG tablet 2 tabs po on Day 1 of gout flare, then 1 tab QD until gout flare is over. 30 tablet 0  . Docusate Calcium (STOOL SOFTENER PO) Take by mouth as needed.    Marland Kitchen ELIQUIS 5 MG TABS tablet Take 1 tablet by mouth twice daily 180 tablet 0  . Ferrous Sulfate (IRON SUPPLEMENT PO) Take 1 tablet by mouth daily.    Marland Kitchen gabapentin (NEURONTIN) 300 MG capsule Take 600 mg by mouth 2 (two) times daily.     . Latanoprost (XELPROS) 0.005 % EMUL Apply 1 drop to eye at bedtime.    Marland Kitchen levothyroxine (EUTHYROX) 88 MCG tablet Take 1 tablet (88 mcg total) by mouth daily. 90 tablet 1  . niacin (NIASPAN) 500 MG CR tablet Take 500 mg by mouth daily.    . nitroGLYCERIN (NITROSTAT) 0.4 MG SL tablet DISSOLVE ONE TABLET UNDER THE TONGUE EVERY 5 MINUTES AS NEEDED FOR CHEST PAIN 100 tablet prn  . omeprazole (PRILOSEC) 40 MG capsule Take 1 capsule by mouth once daily 90 capsule 3  . REFRESH CELLUVISC 1 % GEL Apply 1 drop to eye 3 (three) times daily.     . sodium polystyrene (KAYEXALATE) 15 GM/60ML suspension Take 60 mLs (15 g total) by mouth daily. 240 mL 0  . telmisartan (MICARDIS) 20 MG tablet Take 1 tablet (20 mg total) by mouth daily. 90 tablet 1  . timolol (TIMOPTIC) 0.5 % ophthalmic solution Place 1 drop into both eyes daily.     . vitamin B-12 (CYANOCOBALAMIN) 1000 MCG tablet Take 1 tablet by mouth daily.     No facility-administered medications prior to visit.    Allergies  Allergen Reactions  . Atorvastatin Other (See Comments)    REACTION: Muscle weakness  . Nsaids Other (See Comments)    Avoid with current kidney function.    . Colesevelam Other (See Comments)    REACTION: joint pain  . Pravastatin Other (See Comments)    Muscle aches  . Simvastatin Other (See Comments)     REACTION: Myalgias  ROS Review of Systems    Objective:    Physical Exam Constitutional:      Appearance: He is well-developed.  HENT:     Head: Normocephalic and atraumatic.  Cardiovascular:     Rate and Rhythm: Normal rate and regular rhythm.     Heart sounds: Normal heart sounds.  Pulmonary:     Effort: Pulmonary effort is normal.     Breath sounds: Normal breath sounds.  Skin:    General: Skin is warm and dry.  Neurological:     Mental Status: He is alert and oriented to person, place, and time.  Psychiatric:        Behavior: Behavior normal.     BP (!) 122/58   Pulse 65   Ht 5\' 8"  (1.727 m)   Wt 170 lb (77.1 kg)   SpO2 99%   BMI 25.85 kg/m  Wt Readings from Last 3 Encounters:  03/04/21 170 lb (77.1 kg)  01/20/21 169 lb 1.9 oz (76.7 kg)  12/30/20 177 lb (80.3 kg)     Health Maintenance Due  Topic Date Due  . COVID-19 Vaccine (3 - Moderna risk 4-dose series) 05/24/2020    There are no preventive care reminders to display for this patient.  Lab Results  Component Value Date   TSH 2.05 06/18/2020   Lab Results  Component Value Date   WBC 4.8 03/03/2021   HGB 10.7 (L) 03/03/2021   HCT 33.1 (L) 03/03/2021   MCV 102.5 (H) 03/03/2021   PLT 165 03/03/2021   Lab Results  Component Value Date   NA 138 03/03/2021   K 5.3 (H) 03/03/2021   CO2 24 03/03/2021   GLUCOSE 98 03/03/2021   BUN 35 (H) 03/03/2021   CREATININE 2.29 (H) 03/03/2021   BILITOT 0.6 03/03/2021   ALKPHOS 93 03/03/2021   AST 10 (L) 03/03/2021   ALT 7 03/03/2021   PROT 6.5 03/03/2021   ALBUMIN 4.2 03/03/2021   CALCIUM 9.4 03/03/2021   ANIONGAP 7 03/03/2021   GFR 36.14 (L) 08/19/2020   Lab Results  Component Value Date   CHOL 187 05/27/2020   Lab Results  Component Value Date   HDL 39 (L) 05/27/2020   Lab Results  Component Value Date   LDLCALC 123 (H) 05/27/2020   Lab Results  Component Value Date   TRIG 130 05/27/2020   Lab Results  Component Value Date    CHOLHDL 4.8 05/27/2020   Lab Results  Component Value Date   HGBA1C 5.5 08/26/2015      Assessment & Plan:   Problem List Items Addressed This Visit      Cardiovascular and Mediastinum   HYPERTENSION, BENIGN ESSENTIAL - Primary    Well controlled. Continue current regimen. Follow up in  6 mo       Atrial fibrillation (HCC)   Relevant Orders   ECHOCARDIOGRAM COMPLETE   Angina pectoris ([place])    You think he should discuss with Dr. Kirk Ruths he has an appointment coming up in the next couple of months we will see if we can get him in a little sooner concerned about how much of the nitroglycerin he is using and whether or not he might benefit from a daily nitrate versus additional work-up for coronary artery disease.      Relevant Orders   ECHOCARDIOGRAM COMPLETE     Endocrine   Hypothyroidism   Relevant Orders   TSH     Genitourinary   CKD (chronic kidney disease) stage  4, GFR 15-29 ml/min (HCC)     Other   Gout    Check uric acid to make sure at therapeutic goal.      Relevant Orders   Uric acid    Other Visit Diagnoses    SOB (shortness of breath)       Relevant Orders   ECHOCARDIOGRAM COMPLETE      Lightheadedness/dizziness/SOB that seems to be occurring after exertion.  He says this feels different from his vertigo that he has had previously it does not sound consistent with orthostasis.  Rotted duplex in 2016 showing only a mild amount of stenosis in the carotid arteries.  Last echo I can find was from 2014.  Consider valvular dysfunction is a possibility.  We will go ahead and place order for echocardiogram  No orders of the defined types were placed in this encounter.   Follow-up: No follow-ups on file.    Beatrice Lecher, MD

## 2021-03-04 NOTE — Telephone Encounter (Signed)
Jenny Reichmann can you call and see if we can move his appointment up with Dr. Kirk Ruths I think is a follow-up visit in July but has been having some more chest pain recently and I like to get him in a little sooner.  Its not urgent but I doubt my wait until July.

## 2021-03-04 NOTE — Assessment & Plan Note (Signed)
Check uric acid to make sure at therapeutic goal.

## 2021-03-04 NOTE — Assessment & Plan Note (Deleted)
You think he should discuss with Dr. Kirk Ruths he has an appointment coming up in the next couple of months we will see if we can get him in a little sooner concerned about how much of the nitroglycerin he is using and whether or not he might benefit from a daily nitrate versus additional work-up for coronary artery disease.

## 2021-03-04 NOTE — Assessment & Plan Note (Signed)
Well controlled. Continue current regimen. Follow up in  6 mo  

## 2021-03-04 NOTE — Assessment & Plan Note (Signed)
You think he should discuss with Dr. Kirk Ruths he has an appointment coming up in the next couple of months we will see if we can get him in a little sooner concerned about how much of the nitroglycerin he is using and whether or not he might benefit from a daily nitrate versus additional work-up for coronary artery disease.

## 2021-03-06 ENCOUNTER — Telehealth: Payer: Self-pay

## 2021-03-06 IMAGING — CT CT CHEST W/O CM
2 of 4 series · 13 of 36 positions shown, 16 images · non-contrast
Comparison: None.

CLINICAL DATA: recent colonoscopy suggestive of underlying
carcinoma.

EXAM:
CT CHEST, ABDOMEN AND PELVIS WITHOUT CONTRAST
TECHNIQUE: Multidetector CT imaging of the chest, abdomen and pelvis was
performed following the standard protocol without IV contrast.

[Series 2: axial st · axial · 0.96mm/px · z∈[-658,-58]mm · 10 of 142 slices shown, 13 images]
[im 11/142  mediastinal]
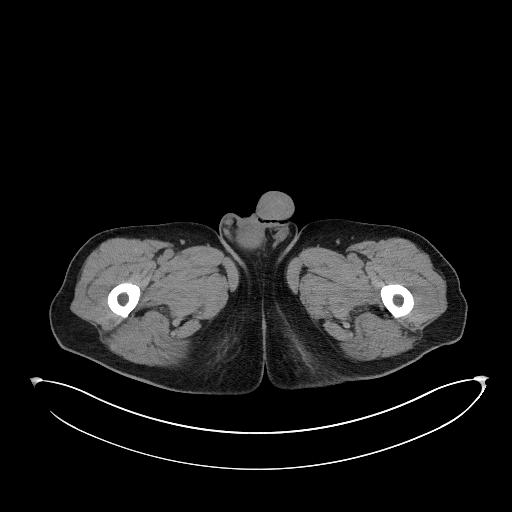
[im 11/142  lung]
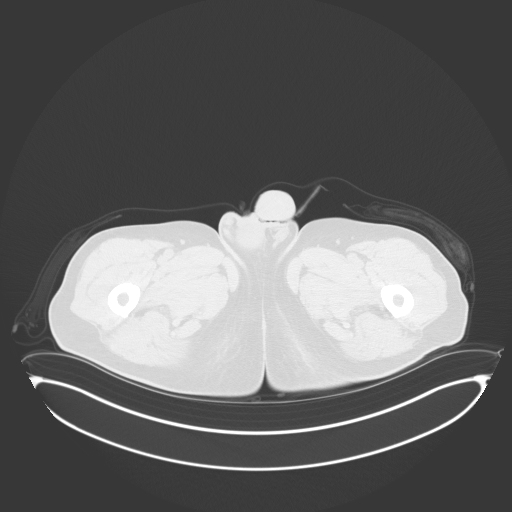
[im 22/142  lung]
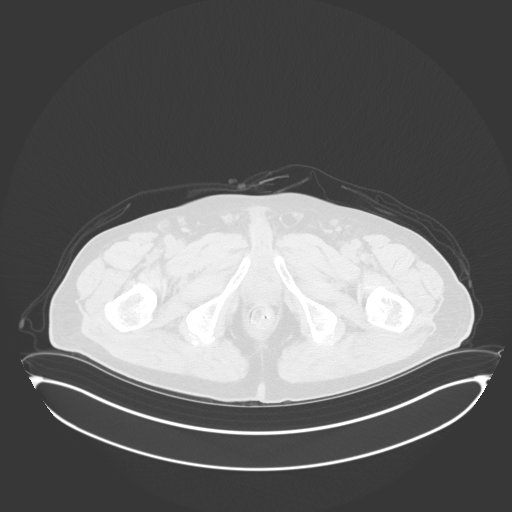
[im 44/142  lung]
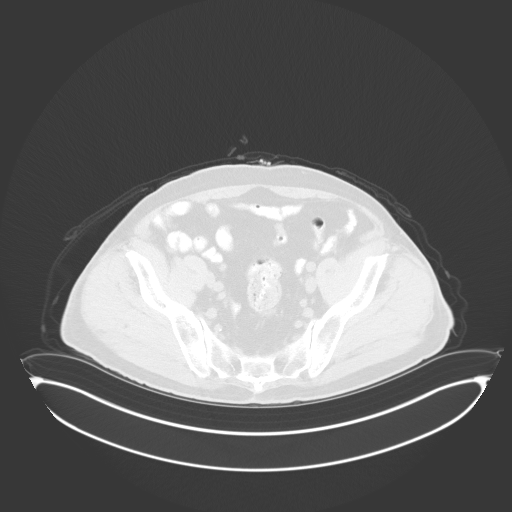
[im 55/142  lung]
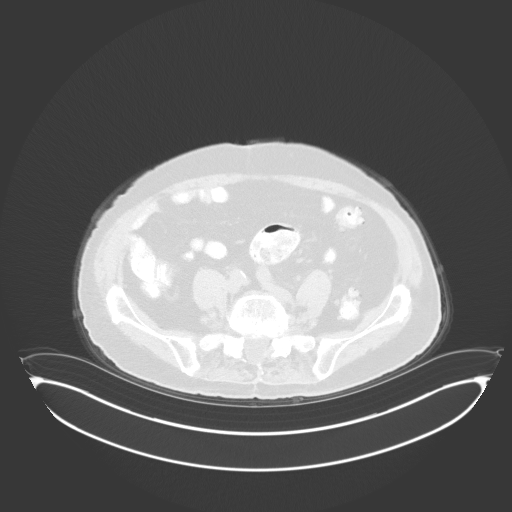
[im 66/142  mediastinal]
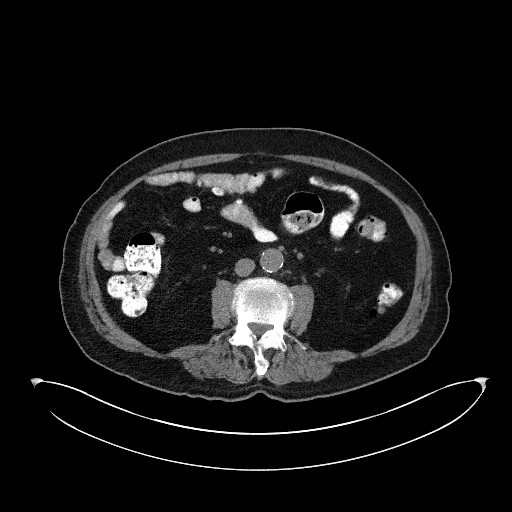
[im 66/142  lung]
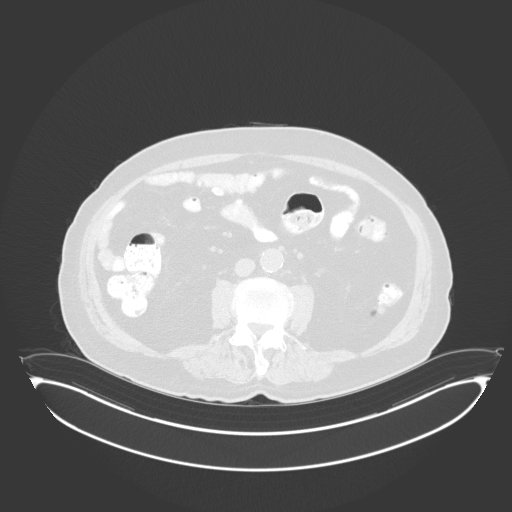
[im 76/142  lung]
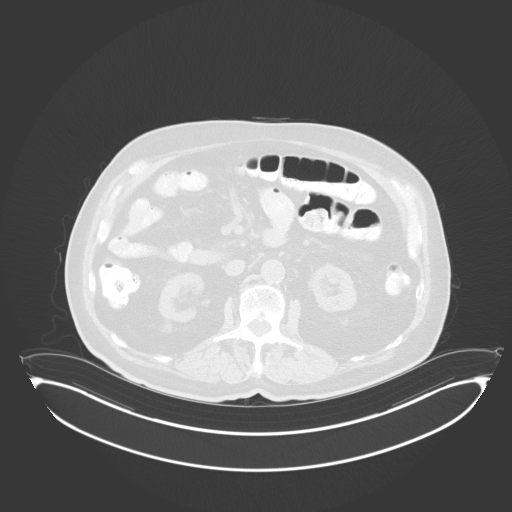
[im 87/142  lung]
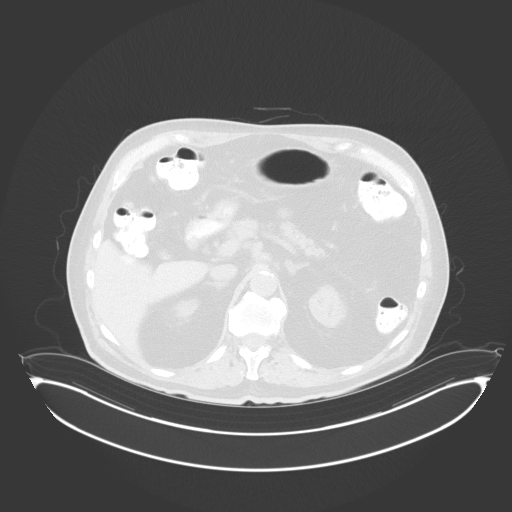
[im 109/142  lung]
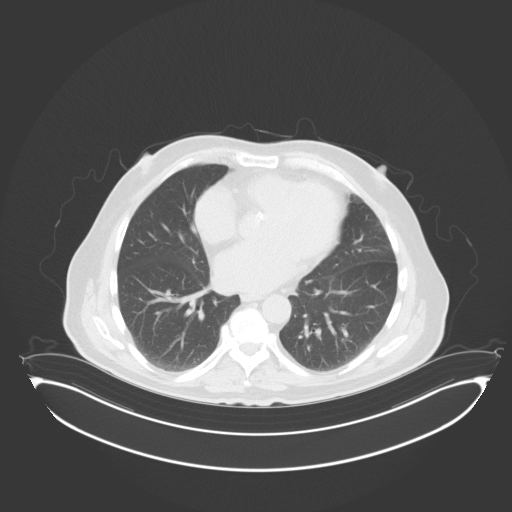
[im 120/142  mediastinal]
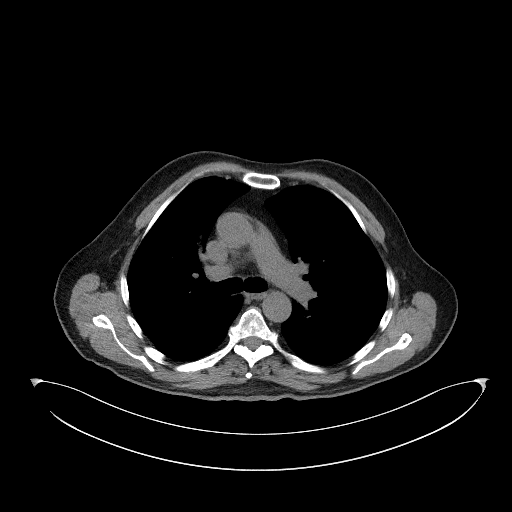
[im 120/142  lung]
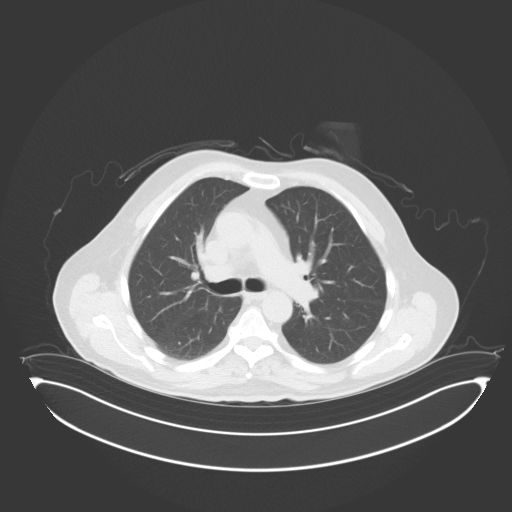
[im 131/142  lung]
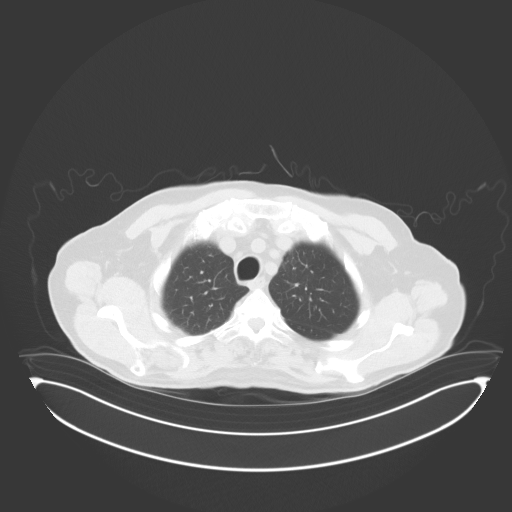

[Series 5: coronal · coronal · 0.84mm/px · 3 of 138 slices shown]
[im 28/138  lung]
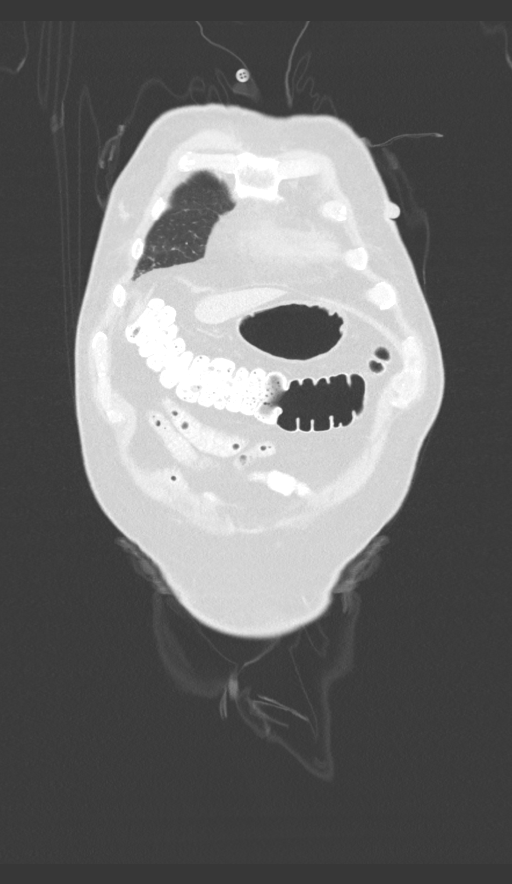
[im 55/138  lung]
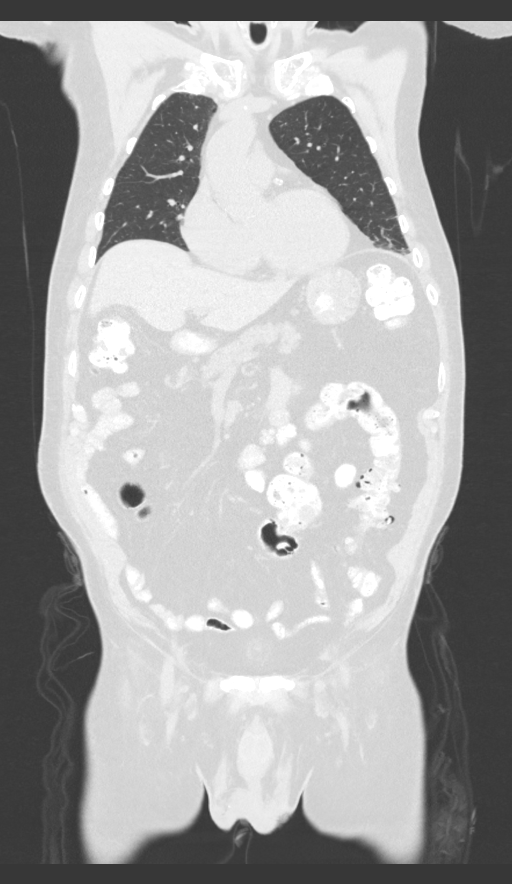
[im 83/138  lung]
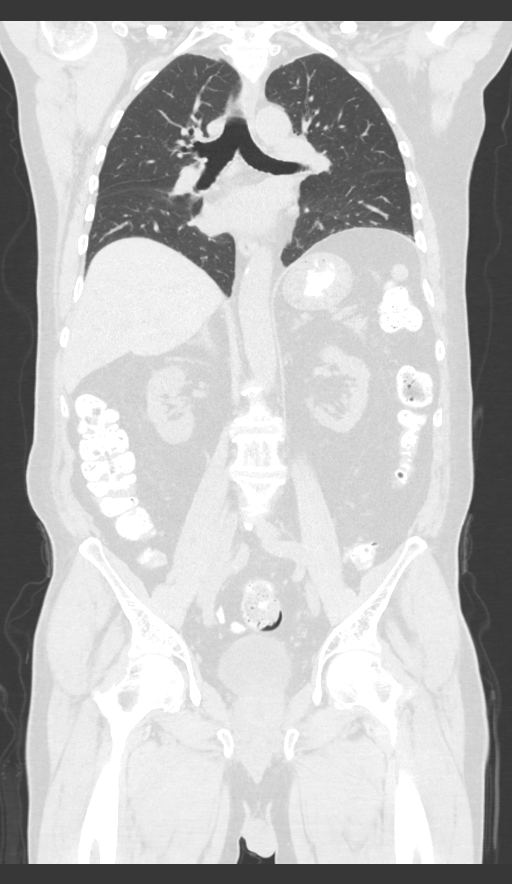

[13 of 36 positions shown; findings below may reference images not displayed]

FINDINGS: CT CHEST FINDINGS

Cardiovascular: Heart size is mildly enlarged. Aortic
atherosclerosis identified. Three vessel coronary artery
calcifications. No pericardial effusion.

Mediastinum/Nodes: No enlarged mediastinal, hilar, or axillary lymph
nodes. Thyroid gland, trachea, and esophagus demonstrate no
significant findings.

Lungs/Pleura: No pleural effusion identified. No airspace
consolidation, atelectasis or pneumothorax. No suspicious pulmonary
nodules or masses identified.

Musculoskeletal: No chest wall mass or suspicious bone lesions
identified.

CT ABDOMEN PELVIS FINDINGS

Hepatobiliary: No focal liver abnormality is seen. No gallstones,
gallbladder wall thickening, or biliary dilatation.

Pancreas: Unremarkable. No pancreatic ductal dilatation or
surrounding inflammatory changes.

Spleen: Normal in size without focal abnormality.

Adrenals/Urinary Tract: Normal appearance of the adrenal glands.
Bilateral kidney cysts are identified, incompletely characterized
without IV contrast. No hydronephrosis identified bilaterally.
Urinary bladder appears unremarkable.

Stomach/Bowel: Normal stomach. No small bowel wall thickening or
inflammation or distension. Left-sided colonic diverticula. Large
stool burden identified within the sigmoid colon and rectum. Within
the distal sigmoid colon there is a small soft tissue attenuating
filling defect, image 57/5 which may correspond to the ulcerative
mass found on colonoscopy. There is mild luminal narrowing at this
level, image 85/2. No obstruction identified.

Vascular/Lymphatic: Aortic atherosclerosis. No aneurysm. No
abdominopelvic adenopathy.

Reproductive: Mild prostate gland enlargement.

Other: No ascites or focal fluid collections. No findings to suggest
peritoneal disease.

Musculoskeletal: Lumbar spondylosis identified. This is most
advanced at the L5-S1 level. No acute or significant osseous
findings.
IMPRESSION: 1. No findings identified to suggest metastatic disease from newly
discovered colonic lesion.
2. Suspicious filling defect within the distal sigmoid colon
corresponding to the ultrasound finding. Although nonspecific in the
setting of unprepped colon this may represent the ulcer 2
nonobstructing mass found on colonoscopy.
3. Coronary artery calcifications
4. Aortic Atherosclerosis (EUOA6-5X6.6) and Emphysema (EUOA6-N26.W).

## 2021-03-06 NOTE — Telephone Encounter (Signed)
Per 03/06/21 sch/staff message pt needs to see dr e at next lab and inj appt-5/16, appts adjusted and wife is aware of times   Roberto Fields

## 2021-03-07 NOTE — Telephone Encounter (Signed)
Did you heaer back from them?

## 2021-03-10 NOTE — Telephone Encounter (Signed)
Lattie Haw couldn't change it I have tried calling a couple times I will call again today it is so hard to get through to cardiology you sit on the phone for 30 or more minutes waiting for someone to pick up and last week I didn't get through I am going to call again today and see if I can get someone to answer.   Jenny Reichmann

## 2021-03-11 DIAGNOSIS — H18513 Endothelial corneal dystrophy, bilateral: Secondary | ICD-10-CM | POA: Diagnosis not present

## 2021-03-11 DIAGNOSIS — H401133 Primary open-angle glaucoma, bilateral, severe stage: Secondary | ICD-10-CM | POA: Diagnosis not present

## 2021-03-12 ENCOUNTER — Other Ambulatory Visit: Payer: Self-pay | Admitting: Family Medicine

## 2021-03-12 DIAGNOSIS — E039 Hypothyroidism, unspecified: Secondary | ICD-10-CM

## 2021-03-19 ENCOUNTER — Other Ambulatory Visit: Payer: Self-pay

## 2021-03-19 ENCOUNTER — Ambulatory Visit (HOSPITAL_COMMUNITY)
Admission: RE | Admit: 2021-03-19 | Discharge: 2021-03-19 | Disposition: A | Discharge status: Home / Self Care | Payer: PPO | Source: Ambulatory Visit | Attending: Family Medicine | Admitting: Family Medicine

## 2021-03-19 DIAGNOSIS — R079 Chest pain, unspecified: Secondary | ICD-10-CM | POA: Insufficient documentation

## 2021-03-19 DIAGNOSIS — I25119 Atherosclerotic heart disease of native coronary artery with unspecified angina pectoris: Secondary | ICD-10-CM | POA: Insufficient documentation

## 2021-03-19 DIAGNOSIS — I480 Paroxysmal atrial fibrillation: Secondary | ICD-10-CM

## 2021-03-19 DIAGNOSIS — I351 Nonrheumatic aortic (valve) insufficiency: Secondary | ICD-10-CM | POA: Insufficient documentation

## 2021-03-19 DIAGNOSIS — I10 Essential (primary) hypertension: Secondary | ICD-10-CM | POA: Insufficient documentation

## 2021-03-19 DIAGNOSIS — I208 Other forms of angina pectoris: Secondary | ICD-10-CM

## 2021-03-19 DIAGNOSIS — I209 Angina pectoris, unspecified: Secondary | ICD-10-CM

## 2021-03-19 DIAGNOSIS — J449 Chronic obstructive pulmonary disease, unspecified: Secondary | ICD-10-CM | POA: Insufficient documentation

## 2021-03-19 DIAGNOSIS — R0602 Shortness of breath: Secondary | ICD-10-CM | POA: Diagnosis not present

## 2021-03-19 DIAGNOSIS — I4891 Unspecified atrial fibrillation: Secondary | ICD-10-CM | POA: Insufficient documentation

## 2021-03-19 LAB — ECHOCARDIOGRAM COMPLETE
AR max vel: 2.22 cm2
AV Area VTI: 2.33 cm2
AV Area mean vel: 2.25 cm2
AV Mean grad: 5.6 mmHg
AV Peak grad: 10.3 mmHg
AV Vena cont: 0.5 cm
Ao pk vel: 1.6 m/s
Area-P 1/2: 2.2 cm2
P 1/2 time: 360 msec
S' Lateral: 3.8 cm

## 2021-03-19 NOTE — Progress Notes (Signed)
  Echocardiogram 2D Echocardiogram has been performed.  Roberto Fields 03/19/2021, 1:57 PM

## 2021-03-21 ENCOUNTER — Other Ambulatory Visit: Payer: Self-pay | Admitting: *Deleted

## 2021-03-21 DIAGNOSIS — D500 Iron deficiency anemia secondary to blood loss (chronic): Secondary | ICD-10-CM

## 2021-03-24 ENCOUNTER — Other Ambulatory Visit: Payer: PPO

## 2021-03-24 ENCOUNTER — Ambulatory Visit: Payer: PPO

## 2021-03-24 ENCOUNTER — Encounter: Payer: Self-pay | Admitting: Hematology & Oncology

## 2021-03-24 ENCOUNTER — Inpatient Hospital Stay (HOSPITAL_BASED_OUTPATIENT_CLINIC_OR_DEPARTMENT_OTHER): Payer: PPO | Admitting: Hematology & Oncology

## 2021-03-24 ENCOUNTER — Inpatient Hospital Stay: Payer: PPO | Attending: Hematology & Oncology

## 2021-03-24 ENCOUNTER — Inpatient Hospital Stay: Payer: PPO

## 2021-03-24 ENCOUNTER — Other Ambulatory Visit: Payer: Self-pay

## 2021-03-24 VITALS — BP 126/42 | HR 57 | Temp 98.0°F | Resp 19 | Ht 68.0 in | Wt 171.1 lb

## 2021-03-24 DIAGNOSIS — C187 Malignant neoplasm of sigmoid colon: Secondary | ICD-10-CM | POA: Diagnosis not present

## 2021-03-24 DIAGNOSIS — D500 Iron deficiency anemia secondary to blood loss (chronic): Secondary | ICD-10-CM | POA: Diagnosis not present

## 2021-03-24 DIAGNOSIS — N289 Disorder of kidney and ureter, unspecified: Secondary | ICD-10-CM | POA: Insufficient documentation

## 2021-03-24 DIAGNOSIS — I480 Paroxysmal atrial fibrillation: Secondary | ICD-10-CM | POA: Insufficient documentation

## 2021-03-24 DIAGNOSIS — Z85038 Personal history of other malignant neoplasm of large intestine: Secondary | ICD-10-CM | POA: Diagnosis not present

## 2021-03-24 DIAGNOSIS — E039 Hypothyroidism, unspecified: Secondary | ICD-10-CM | POA: Diagnosis not present

## 2021-03-24 DIAGNOSIS — Z7901 Long term (current) use of anticoagulants: Secondary | ICD-10-CM | POA: Insufficient documentation

## 2021-03-24 DIAGNOSIS — Z79899 Other long term (current) drug therapy: Secondary | ICD-10-CM | POA: Diagnosis not present

## 2021-03-24 DIAGNOSIS — D631 Anemia in chronic kidney disease: Secondary | ICD-10-CM | POA: Diagnosis not present

## 2021-03-24 LAB — COMPREHENSIVE METABOLIC PANEL
ALT: 8 U/L (ref 0–44)
AST: 12 U/L — ABNORMAL LOW (ref 15–41)
Albumin: 4.1 g/dL (ref 3.5–5.0)
Alkaline Phosphatase: 101 U/L (ref 38–126)
Anion gap: 7 (ref 5–15)
BUN: 35 mg/dL — ABNORMAL HIGH (ref 8–23)
CO2: 24 mmol/L (ref 22–32)
Calcium: 9.6 mg/dL (ref 8.9–10.3)
Chloride: 110 mmol/L (ref 98–111)
Creatinine, Ser: 2.3 mg/dL — ABNORMAL HIGH (ref 0.61–1.24)
GFR, Estimated: 29 mL/min — ABNORMAL LOW (ref >60)
Glucose, Bld: 105 mg/dL — ABNORMAL HIGH (ref 70–99)
Potassium: 5.4 mmol/L — ABNORMAL HIGH (ref 3.5–5.1)
Sodium: 141 mmol/L (ref 135–145)
Total Bilirubin: 0.5 mg/dL (ref 0.3–1.2)
Total Protein: 6.2 g/dL — ABNORMAL LOW (ref 6.5–8.1)

## 2021-03-24 LAB — CBC WITH DIFFERENTIAL (CANCER CENTER ONLY)
Abs Immature Granulocytes: 0.01 10*3/uL (ref 0.00–0.07)
Basophils Absolute: 0 10*3/uL (ref 0.0–0.1)
Basophils Relative: 0 %
Eosinophils Absolute: 0.1 10*3/uL (ref 0.0–0.5)
Eosinophils Relative: 2 %
HCT: 34.2 % — ABNORMAL LOW (ref 39.0–52.0)
Hemoglobin: 11 g/dL — ABNORMAL LOW (ref 13.0–17.0)
Immature Granulocytes: 0 %
Lymphocytes Relative: 27 %
Lymphs Abs: 1.3 10*3/uL (ref 0.7–4.0)
MCH: 33.2 pg (ref 26.0–34.0)
MCHC: 32.2 g/dL (ref 30.0–36.0)
MCV: 103.3 fL — ABNORMAL HIGH (ref 80.0–100.0)
Monocytes Absolute: 0.4 10*3/uL (ref 0.1–1.0)
Monocytes Relative: 10 %
Neutro Abs: 2.8 10*3/uL (ref 1.7–7.7)
Neutrophils Relative %: 61 %
Platelet Count: 149 10*3/uL — ABNORMAL LOW (ref 150–400)
RBC: 3.31 MIL/uL — ABNORMAL LOW (ref 4.22–5.81)
RDW: 14.1 % (ref 11.5–15.5)
WBC Count: 4.6 10*3/uL (ref 4.0–10.5)
nRBC: 0 % (ref 0.0–0.2)

## 2021-03-24 NOTE — Progress Notes (Signed)
Hematology and Oncology Follow Up Visit  Roberto Fields [id number] [birth year] 75 y.o. 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  There have some problems with respect to getting to the office.  I was called by their family doctor.  We will see how we can try to make this easier for him.  He has some transportation issues.  There is some financial issues.  He is responding to the Aranesp.  Today, his hemoglobin is 11.0.  However, he just does not feel that great.  He feels very tired and fatigued.  I am not sure as to why he would feel this way.  I suppose his iron could be on the low side.  I know he is on quite a few medications.  I know he is hypothyroidism.  I do not know if this needs to be checked.  He did have an echocardiogram done recently.  This showed moderate aortic regurgitation.  I do not know if this might be a cause of him not feeling well and getting tired.  He has had a decent appetite.  He has had no nausea or vomiting.  There has been no diarrhea.  There has been no issues with fever.  He has had no exposure to COVID.  Overall, I would say his performance status is ECOG 2.    Medications:  Allergies as of 03/24/2021      Reactions   Atorvastatin Other (See Comments)   REACTION: Muscle weakness   Nsaids Other (See Comments)   Avoid with current kidney function.     Colesevelam Other (See Comments)   REACTION: joint pain   Pravastatin Other (See Comments)   Muscle aches   Simvastatin Other (See Comments)   REACTION:  Myalgias      Medication List       Accurate as of Mar 24, 2021  2:57 PM. If you have any questions, ask your nurse or doctor.        amLODipine 10 MG tablet Commonly known as: NORVASC Take 1 tablet (10 mg total) by mouth daily.   colchicine 0.6 MG tablet 2 tabs po on Day 1 of gout flare, then 1 tab QD until gout flare is over.   Eliquis 5 MG Tabs tablet Generic drug: apixaban Take 1 tablet by mouth twice daily   Euthyrox 88 MCG tablet Generic drug: levothyroxine Take 1 tablet by mouth once daily   gabapentin 300 MG capsule Commonly known as: NEURONTIN Take 600 mg by mouth 2 (two) times daily.   IRON SUPPLEMENT PO Take 1 tablet by mouth daily.   niacin 500 MG CR tablet Commonly known as: NIASPAN Take 500 mg by mouth daily.   nitroGLYCERIN 0.4 MG SL tablet Commonly known as: NITROSTAT DISSOLVE ONE TABLET UNDER THE TONGUE EVERY 5 MINUTES AS NEEDED FOR CHEST PAIN   omeprazole 40 MG capsule Commonly known as: PRILOSEC Take 1 capsule by mouth once daily   Refresh Celluvisc 1 % Gel Generic drug: Carboxymethylcellulose Sod PF Apply 1 drop to eye 3 (three) times daily.   sodium polystyrene 15 GM/60ML  suspension Commonly known as: KAYEXALATE Take 60 mLs (15 g total) by mouth daily.   STOOL SOFTENER PO Take by mouth as needed.   telmisartan 20 MG tablet Commonly known as: MICARDIS Take 1 tablet (20 mg total) by mouth daily.   timolol 0.5 % ophthalmic solution Commonly known as: TIMOPTIC Place 1 drop into both eyes daily.   TYLENOL PO Take by mouth daily as needed.   vitamin B-12 1000 MCG tablet Commonly known as: CYANOCOBALAMIN Take 1 tablet by mouth daily.   Xelpros 0.005 % Emul Generic drug: Latanoprost Apply 1 drop to eye at bedtime.       Allergies:  Allergies  Allergen Reactions  . Atorvastatin Other (See Comments)    REACTION: Muscle weakness  . Nsaids Other (See Comments)    Avoid with current kidney function.    . Colesevelam Other  (See Comments)    REACTION: joint pain  . Pravastatin Other (See Comments)    Muscle aches  . Simvastatin Other (See Comments)    REACTION: Myalgias    Past Medical History, Surgical history, Social history, and Family History were reviewed and updated.  Review of Systems: Review of Systems  Constitutional: Positive for malaise/fatigue.  HENT: Negative.   Eyes: Negative.   Respiratory: Positive for shortness of breath.   Cardiovascular: Positive for palpitations and leg swelling.  Gastrointestinal: Positive for nausea.  Genitourinary: Negative.   Musculoskeletal: Positive for joint pain and myalgias.  Skin: Negative.   Neurological: Negative.   Endo/Heme/Allergies: Negative.   Psychiatric/Behavioral: Negative.      Physical Exam:  height is 5\' 8"  (1.727 m) and weight is 171 lb 1.9 oz (77.6 kg). His oral temperature is 98 F (36.7 C). His blood pressure is 126/42 (abnormal) and his pulse is 57 (abnormal). His respiration is 19 and oxygen saturation is 98%.   Wt Readings from Last 3 Encounters:  03/24/21 171 lb 1.9 oz (77.6 kg)  03/04/21 170 lb (77.1 kg)  01/20/21 169 lb 1.9 oz (76.7 kg)    Physical Exam Vitals reviewed.  HENT:     Head: Normocephalic and atraumatic.  Eyes:     Pupils: Pupils are equal, round, and reactive to light.  Cardiovascular:     Rate and Rhythm: Normal rate and regular rhythm.     Heart sounds: Normal heart sounds.  Pulmonary:     Effort: Pulmonary effort is normal.     Breath sounds: Normal breath sounds.  Abdominal:     General: Bowel sounds are normal.     Palpations: Abdomen is soft.  Musculoskeletal:        General: No tenderness or deformity. Normal range of motion.     Cervical back: Normal range of motion.  Lymphadenopathy:     Cervical: No cervical adenopathy.  Skin:    General: Skin is warm and dry.     Findings: No erythema or rash.  Neurological:     Mental Status: He is alert and oriented to person, place, and time.   Psychiatric:        Behavior: Behavior normal.        Thought Content: Thought content normal.        Judgment: Judgment normal.      Lab Results  Component Value Date   WBC 4.6 03/24/2021   HGB 11.0 (L) 03/24/2021   HCT 34.2 (L) 03/24/2021   MCV 103.3 (H) 03/24/2021   PLT 149 (L) 03/24/2021   Lab Results  Component Value Date  FERRITIN 79 01/20/2021   IRON 143 01/20/2021   TIBC 343 01/20/2021   UIBC 200 01/20/2021   IRONPCTSAT 42 01/20/2021   Lab Results  Component Value Date   RETICCTPCT 1.8 01/20/2021   RBC 3.31 (L) 03/24/2021   Lab Results  Component Value Date   KPAFRELGTCHN 64.7 (H) 06/27/2020   LAMBDASER 46.3 (H) 06/27/2020   KAPLAMBRATIO 1.40 06/27/2020   Lab Results  Component Value Date   IGGSERUM 649 06/27/2020   IGA 223 06/27/2020   IGMSERUM 140 06/27/2020   Lab Results  Component Value Date   TOTALPROTELP 6.3 06/27/2020     Chemistry      Component Value Date/Time   NA 141 03/24/2021 1310   NA 142 01/05/2017 0000   K 5.4 (H) 03/24/2021 1310   CL 110 03/24/2021 1310   CO2 24 03/24/2021 1310   BUN 35 (H) 03/24/2021 1310   BUN 24 (A) 01/05/2017 0000   CREATININE 2.30 (H) 03/24/2021 1310   CREATININE 2.29 (H) 03/03/2021 1335   CREATININE 1.66 (H) 09/03/2020 1143   GLU 95 01/05/2017 0000      Component Value Date/Time   CALCIUM 9.6 03/24/2021 1310   CALCIUM 9.5 09/19/2014 0000   ALKPHOS 101 03/24/2021 1310   AST 12 (L) 03/24/2021 1310   AST 10 (L) 03/03/2021 1335   ALT 8 03/24/2021 1310   ALT 7 03/03/2021 1335   BILITOT 0.5 03/24/2021 1310   BILITOT 0.6 03/03/2021 1335       Impression and Plan: Mr. Sloop is a very pleasant 75 yo caucasian gentleman with multifactorial anemia as well as history of colon cancer treated with resection in November 2020.  I am glad that his hemoglobin is doing well.  I just wish he would feel better.  Again we will have to see how the iron studies look.  We will try to do what we can to try to  make it easy for him.  Again I know it is hard for him to get to the office.  I do not know if we could do the Aranesp at home if necessary.  I would like to see him back, if possible, in about 3 weeks.  [name], MD 5/16/20222:57 PM

## 2021-03-25 ENCOUNTER — Telehealth: Payer: Self-pay | Admitting: *Deleted

## 2021-03-25 LAB — IRON AND TIBC
Iron: 106 ug/dL (ref 42–163)
Saturation Ratios: 33 % (ref 20–55)
TIBC: 318 ug/dL (ref 202–409)
UIBC: 211 ug/dL (ref 117–376)

## 2021-03-25 LAB — FERRITIN: Ferritin: 76 ng/mL (ref 24–336)

## 2021-03-25 NOTE — Telephone Encounter (Signed)
Called patient to let him know that his iron studies were all normal per Dr Marin Olp.  LM on personal answering machine

## 2021-03-31 ENCOUNTER — Other Ambulatory Visit: Payer: Self-pay | Admitting: *Deleted

## 2021-03-31 DIAGNOSIS — I359 Nonrheumatic aortic valve disorder, unspecified: Secondary | ICD-10-CM

## 2021-04-01 ENCOUNTER — Other Ambulatory Visit: Payer: Self-pay | Admitting: *Deleted

## 2021-04-01 MED ORDER — ELIQUIS 5 MG PO TABS: 1.0000 | ORAL_TABLET | Freq: Two times a day (BID) | ORAL | 0 refills | Status: DC

## 2021-04-10 ENCOUNTER — Telehealth: Payer: Self-pay

## 2021-04-10 NOTE — Telephone Encounter (Signed)
S/w pts wife and she is aware of the r/s 04/14/21 appt to 6/7 per her req, ok not to see de e this time   Roberto Fields

## 2021-04-10 NOTE — Telephone Encounter (Signed)
Pts wife called to see if he can just do lab and inj if needed on  6/7 as he is not able to come in on 6/6 and dr e has no avail appts at this time.  Please advise   Tennelle Taflinger

## 2021-04-14 ENCOUNTER — Inpatient Hospital Stay: Payer: PPO

## 2021-04-14 ENCOUNTER — Ambulatory Visit: Payer: PPO

## 2021-04-14 ENCOUNTER — Inpatient Hospital Stay: Payer: PPO | Admitting: Hematology & Oncology

## 2021-04-15 ENCOUNTER — Inpatient Hospital Stay: Payer: PPO | Attending: Hematology & Oncology

## 2021-04-15 ENCOUNTER — Other Ambulatory Visit: Payer: Self-pay

## 2021-04-15 ENCOUNTER — Inpatient Hospital Stay: Payer: PPO

## 2021-04-15 VITALS — BP 118/46 | HR 54 | Temp 97.8°F

## 2021-04-15 DIAGNOSIS — Z85038 Personal history of other malignant neoplasm of large intestine: Secondary | ICD-10-CM | POA: Insufficient documentation

## 2021-04-15 DIAGNOSIS — D631 Anemia in chronic kidney disease: Secondary | ICD-10-CM | POA: Diagnosis not present

## 2021-04-15 DIAGNOSIS — N289 Disorder of kidney and ureter, unspecified: Secondary | ICD-10-CM | POA: Diagnosis not present

## 2021-04-15 DIAGNOSIS — D500 Iron deficiency anemia secondary to blood loss (chronic): Secondary | ICD-10-CM

## 2021-04-15 DIAGNOSIS — C187 Malignant neoplasm of sigmoid colon: Secondary | ICD-10-CM

## 2021-04-15 LAB — CBC WITH DIFFERENTIAL (CANCER CENTER ONLY)
Abs Immature Granulocytes: 0.01 10*3/uL (ref 0.00–0.07)
Basophils Absolute: 0 10*3/uL (ref 0.0–0.1)
Basophils Relative: 0 %
Eosinophils Absolute: 0.1 10*3/uL (ref 0.0–0.5)
Eosinophils Relative: 2 %
HCT: 29.1 % — ABNORMAL LOW (ref 39.0–52.0)
Hemoglobin: 9.5 g/dL — ABNORMAL LOW (ref 13.0–17.0)
Immature Granulocytes: 0 %
Lymphocytes Relative: 31 %
Lymphs Abs: 1.3 10*3/uL (ref 0.7–4.0)
MCH: 33.6 pg (ref 26.0–34.0)
MCHC: 32.6 g/dL (ref 30.0–36.0)
MCV: 102.8 fL — ABNORMAL HIGH (ref 80.0–100.0)
Monocytes Absolute: 0.4 10*3/uL (ref 0.1–1.0)
Monocytes Relative: 9 %
Neutro Abs: 2.5 10*3/uL (ref 1.7–7.7)
Neutrophils Relative %: 58 %
Platelet Count: 160 10*3/uL (ref 150–400)
RBC: 2.83 MIL/uL — ABNORMAL LOW (ref 4.22–5.81)
RDW: 13.4 % (ref 11.5–15.5)
WBC Count: 4.3 10*3/uL (ref 4.0–10.5)
nRBC: 0 % (ref 0.0–0.2)

## 2021-04-15 LAB — CMP (CANCER CENTER ONLY)
ALT: 7 U/L (ref 0–44)
AST: 10 U/L — ABNORMAL LOW (ref 15–41)
Albumin: 3.9 g/dL (ref 3.5–5.0)
Alkaline Phosphatase: 96 U/L (ref 38–126)
Anion gap: 7 (ref 5–15)
BUN: 32 mg/dL — ABNORMAL HIGH (ref 8–23)
CO2: 24 mmol/L (ref 22–32)
Calcium: 9.2 mg/dL (ref 8.9–10.3)
Chloride: 107 mmol/L (ref 98–111)
Creatinine: 2.1 mg/dL — ABNORMAL HIGH (ref 0.61–1.24)
GFR, Estimated: 32 mL/min — ABNORMAL LOW (ref >60)
Glucose, Bld: 102 mg/dL — ABNORMAL HIGH (ref 70–99)
Potassium: 5 mmol/L (ref 3.5–5.1)
Sodium: 138 mmol/L (ref 135–145)
Total Bilirubin: 0.5 mg/dL (ref 0.3–1.2)
Total Protein: 6 g/dL — ABNORMAL LOW (ref 6.5–8.1)

## 2021-04-15 MED ORDER — DARBEPOETIN ALFA 300 MCG/0.6ML IJ SOSY
PREFILLED_SYRINGE | INTRAMUSCULAR | Status: AC
  Filled 2021-04-15: qty 0.6

## 2021-04-15 MED ORDER — DARBEPOETIN ALFA 300 MCG/0.6ML IJ SOSY
300.0000 ug | PREFILLED_SYRINGE | Freq: Once | INTRAMUSCULAR | Status: AC
  Administered 2021-04-15: 300 ug via SUBCUTANEOUS

## 2021-04-15 NOTE — Patient Instructions (Signed)
Darbepoetin Alfa injection What is this medicine? DARBEPOETIN ALFA (dar be POE e tin AL fa) helps your body make more red blood cells. It is used to treat anemia caused by chronic kidney failure and chemotherapy. This medicine may be used for other purposes; ask your health care provider or pharmacist if you have questions. COMMON BRAND NAME(S): Aranesp What should I tell my health care provider before I take this medicine? They need to know if you have any of these conditions:  blood clotting disorders or history of blood clots  cancer patient not on chemotherapy  cystic fibrosis  heart disease, such as angina, heart failure, or a history of a heart attack  hemoglobin level of 12 g/dL or greater  high blood pressure  low levels of folate, iron, or vitamin B12  seizures  an unusual or allergic reaction to darbepoetin, erythropoietin, albumin, hamster proteins, latex, other medicines, foods, dyes, or preservatives  pregnant or trying to get pregnant  breast-feeding How should I use this medicine? This medicine is for injection into a vein or under the skin. It is usually given by a health care professional in a hospital or clinic setting. If you get this medicine at home, you will be taught how to prepare and give this medicine. Use exactly as directed. Take your medicine at regular intervals. Do not take your medicine more often than directed. It is important that you put your used needles and syringes in a special sharps container. Do not put them in a trash can. If you do not have a sharps container, call your pharmacist or healthcare provider to get one. A special MedGuide will be given to you by the pharmacist with each prescription and refill. Be sure to read this information carefully each time. Talk to your pediatrician regarding the use of this medicine in children. While this medicine may be used in children as young as 1 month of age for selected conditions, precautions do  apply. Overdosage: If you think you have taken too much of this medicine contact a poison control center or emergency room at once. NOTE: This medicine is only for you. Do not share this medicine with others. What if I miss a dose? If you miss a dose, take it as soon as you can. If it is almost time for your next dose, take only that dose. Do not take double or extra doses. What may interact with this medicine? Do not take this medicine with any of the following medications:  epoetin alfa This list may not describe all possible interactions. Give your health care provider a list of all the medicines, herbs, non-prescription drugs, or dietary supplements you use. Also tell them if you smoke, drink alcohol, or use illegal drugs. Some items may interact with your medicine. What should I watch for while using this medicine? Your condition will be monitored carefully while you are receiving this medicine. You may need blood work done while you are taking this medicine. This medicine may cause a decrease in vitamin B6. You should make sure that you get enough vitamin B6 while you are taking this medicine. Discuss the foods you eat and the vitamins you take with your health care professional. What side effects may I notice from receiving this medicine? Side effects that you should report to your doctor or health care professional as soon as possible:  allergic reactions like skin rash, itching or hives, swelling of the face, lips, or tongue  breathing problems  changes in   vision  chest pain  confusion, trouble speaking or understanding  feeling faint or lightheaded, falls  high blood pressure  muscle aches or pains  pain, swelling, warmth in the leg  rapid weight gain  severe headaches  sudden numbness or weakness of the face, arm or leg  trouble walking, dizziness, loss of balance or coordination  seizures (convulsions)  swelling of the ankles, feet, hands  unusually weak or  tired Side effects that usually do not require medical attention (report to your doctor or health care professional if they continue or are bothersome):  diarrhea  fever, chills (flu-like symptoms)  headaches  nausea, vomiting  redness, stinging, or swelling at site where injected This list may not describe all possible side effects. Call your doctor for medical advice about side effects. You may report side effects to FDA at 1-800-FDA-1088. Where should I keep my medicine? Keep out of the reach of children. Store in a refrigerator between 2 and 8 degrees C (36 and 46 degrees F). Do not freeze. Do not shake. Throw away any unused portion if using a single-dose vial. Throw away any unused medicine after the expiration date. NOTE: This sheet is a summary. It may not cover all possible information. If you have questions about this medicine, talk to your doctor, pharmacist, or health care provider.  2021 Elsevier/Gold Standard (2017-11-10 16:44:20)  

## 2021-04-25 ENCOUNTER — Telehealth: Payer: Self-pay | Admitting: Cardiology

## 2021-04-25 DIAGNOSIS — R531 Weakness: Secondary | ICD-10-CM

## 2021-04-25 NOTE — Telephone Encounter (Signed)
Spoke with dr Zigmund Daniel, she works with [employer] and this mutual patient has a follow up with [place] in July. The patient is having weak spells that come on all of a sudden and then go away. He is not having any palpitations just feels weak. She is thinking an event monitor prior to his appointment will be helpful to determine if the weak spells are coming from atrial fib that the patient is unaware of. Aware will forward to dr [name] and make him aware. Can leave a message at her office at [phone] of any decisions made.

## 2021-04-25 NOTE — Telephone Encounter (Signed)
Dr Liston Alba called in requesting to speak with Dr [name] about this pt . She stated she would be unable from 12:50 to 1:40 but anytime after that.   [phone] Cell number

## 2021-04-28 ENCOUNTER — Encounter: Payer: Self-pay | Admitting: *Deleted

## 2021-04-28 NOTE — Telephone Encounter (Signed)
Left message for dr Zigmund Daniel, order for 30 day monitor ordered. Spoke with pt, aware monitor will be mailed to his home.

## 2021-05-02 ENCOUNTER — Ambulatory Visit (INDEPENDENT_AMBULATORY_CARE_PROVIDER_SITE_OTHER): Payer: PPO

## 2021-05-02 DIAGNOSIS — R001 Bradycardia, unspecified: Secondary | ICD-10-CM

## 2021-05-02 DIAGNOSIS — I480 Paroxysmal atrial fibrillation: Secondary | ICD-10-CM

## 2021-05-02 DIAGNOSIS — R531 Weakness: Secondary | ICD-10-CM

## 2021-05-05 ENCOUNTER — Inpatient Hospital Stay: Payer: PPO

## 2021-05-05 ENCOUNTER — Telehealth: Payer: Self-pay

## 2021-05-07 ENCOUNTER — Inpatient Hospital Stay: Payer: PPO

## 2021-05-07 ENCOUNTER — Other Ambulatory Visit: Payer: Self-pay

## 2021-05-07 VITALS — BP 118/47 | HR 56 | Temp 98.1°F | Resp 18

## 2021-05-07 DIAGNOSIS — C187 Malignant neoplasm of sigmoid colon: Secondary | ICD-10-CM

## 2021-05-07 DIAGNOSIS — N289 Disorder of kidney and ureter, unspecified: Secondary | ICD-10-CM | POA: Diagnosis not present

## 2021-05-07 DIAGNOSIS — D631 Anemia in chronic kidney disease: Secondary | ICD-10-CM

## 2021-05-07 DIAGNOSIS — D500 Iron deficiency anemia secondary to blood loss (chronic): Secondary | ICD-10-CM

## 2021-05-07 LAB — CMP (CANCER CENTER ONLY)
ALT: 7 U/L (ref 0–44)
AST: 11 U/L — ABNORMAL LOW (ref 15–41)
Albumin: 4.1 g/dL (ref 3.5–5.0)
Alkaline Phosphatase: 95 U/L (ref 38–126)
Anion gap: 8 (ref 5–15)
BUN: 33 mg/dL — ABNORMAL HIGH (ref 8–23)
CO2: 23 mmol/L (ref 22–32)
Calcium: 9.1 mg/dL (ref 8.9–10.3)
Chloride: 107 mmol/L (ref 98–111)
Creatinine: 1.86 mg/dL — ABNORMAL HIGH (ref 0.61–1.24)
GFR, Estimated: 37 mL/min — ABNORMAL LOW (ref >60)
Glucose, Bld: 132 mg/dL — ABNORMAL HIGH (ref 70–99)
Potassium: 4.7 mmol/L (ref 3.5–5.1)
Sodium: 138 mmol/L (ref 135–145)
Total Bilirubin: 0.7 mg/dL (ref 0.3–1.2)
Total Protein: 6.2 g/dL — ABNORMAL LOW (ref 6.5–8.1)

## 2021-05-07 LAB — CBC WITH DIFFERENTIAL (CANCER CENTER ONLY)
Abs Immature Granulocytes: 0.01 10*3/uL (ref 0.00–0.07)
Basophils Absolute: 0 10*3/uL (ref 0.0–0.1)
Basophils Relative: 0 %
Eosinophils Absolute: 0.1 10*3/uL (ref 0.0–0.5)
Eosinophils Relative: 2 %
HCT: 30.5 % — ABNORMAL LOW (ref 39.0–52.0)
Hemoglobin: 9.8 g/dL — ABNORMAL LOW (ref 13.0–17.0)
Immature Granulocytes: 0 %
Lymphocytes Relative: 28 %
Lymphs Abs: 1.2 10*3/uL (ref 0.7–4.0)
MCH: 34 pg (ref 26.0–34.0)
MCHC: 32.1 g/dL (ref 30.0–36.0)
MCV: 105.9 fL — ABNORMAL HIGH (ref 80.0–100.0)
Monocytes Absolute: 0.4 10*3/uL (ref 0.1–1.0)
Monocytes Relative: 8 %
Neutro Abs: 2.6 10*3/uL (ref 1.7–7.7)
Neutrophils Relative %: 62 %
Platelet Count: 140 10*3/uL — ABNORMAL LOW (ref 150–400)
RBC: 2.88 MIL/uL — ABNORMAL LOW (ref 4.22–5.81)
RDW: 14.3 % (ref 11.5–15.5)
WBC Count: 4.3 10*3/uL (ref 4.0–10.5)
nRBC: 0 % (ref 0.0–0.2)

## 2021-05-07 MED ORDER — DARBEPOETIN ALFA 300 MCG/0.6ML IJ SOSY
PREFILLED_SYRINGE | INTRAMUSCULAR | Status: AC
  Filled 2021-05-07: qty 0.6

## 2021-05-07 MED ORDER — DARBEPOETIN ALFA 300 MCG/0.6ML IJ SOSY
300.0000 ug | PREFILLED_SYRINGE | Freq: Once | INTRAMUSCULAR | Status: AC
  Administered 2021-05-07: 300 ug via SUBCUTANEOUS

## 2021-05-07 NOTE — Patient Instructions (Signed)
Darbepoetin Alfa injection What is this medication? DARBEPOETIN ALFA (dar be POE e tin AL fa) helps your body make more red blood cells. It is used to treat anemia caused by chronic kidney failure and chemotherapy. This medicine may be used for other purposes; ask your health care provider or pharmacist if you have questions. COMMON BRAND NAME(S): Aranesp What should I tell my care team before I take this medication? They need to know if you have any of these conditions: blood clotting disorders or history of blood clots cancer patient not on chemotherapy cystic fibrosis heart disease, such as angina, heart failure, or a history of a heart attack hemoglobin level of 12 g/dL or greater high blood pressure low levels of folate, iron, or vitamin B12 seizures an unusual or allergic reaction to darbepoetin, erythropoietin, albumin, hamster proteins, latex, other medicines, foods, dyes, or preservatives pregnant or trying to get pregnant breast-feeding How should I use this medication? This medicine is for injection into a vein or under the skin. It is usually given by a health care professional in a hospital or clinic setting. If you get this medicine at home, you will be taught how to prepare and give this medicine. Use exactly as directed. Take your medicine at regular intervals. Do not take your medicine more often than directed. It is important that you put your used needles and syringes in a special sharps container. Do not put them in a trash can. If you do not have a sharps container, call your pharmacist or healthcare provider to get one. A special MedGuide will be given to you by the pharmacist with each prescription and refill. Be sure to read this information carefully each time. Talk to your pediatrician regarding the use of this medicine in children. While this medicine may be used in children as young as 1 month of age for selected conditions, precautions do apply. Overdosage: If you  think you have taken too much of this medicine contact a poison control center or emergency room at once. NOTE: This medicine is only for you. Do not share this medicine with others. What if I miss a dose? If you miss a dose, take it as soon as you can. If it is almost time for your next dose, take only that dose. Do not take double or extra doses. What may interact with this medication? Do not take this medicine with any of the following medications: epoetin alfa This list may not describe all possible interactions. Give your health care provider a list of all the medicines, herbs, non-prescription drugs, or dietary supplements you use. Also tell them if you smoke, drink alcohol, or use illegal drugs. Some items may interact with your medicine. What should I watch for while using this medication? Your condition will be monitored carefully while you are receiving this medicine. You may need blood work done while you are taking this medicine. This medicine may cause a decrease in vitamin B6. You should make sure that you get enough vitamin B6 while you are taking this medicine. Discuss the foods you eat and the vitamins you take with your health care professional. What side effects may I notice from receiving this medication? Side effects that you should report to your doctor or health care professional as soon as possible: allergic reactions like skin rash, itching or hives, swelling of the face, lips, or tongue breathing problems changes in vision chest pain confusion, trouble speaking or understanding feeling faint or lightheaded, falls high blood pressure   muscle aches or pains pain, swelling, warmth in the leg rapid weight gain severe headaches sudden numbness or weakness of the face, arm or leg trouble walking, dizziness, loss of balance or coordination seizures (convulsions) swelling of the ankles, feet, hands unusually weak or tired Side effects that usually do not require medical  attention (report to your doctor or health care professional if they continue or are bothersome): diarrhea fever, chills (flu-like symptoms) headaches nausea, vomiting redness, stinging, or swelling at site where injected This list may not describe all possible side effects. Call your doctor for medical advice about side effects. You may report side effects to FDA at 1-800-FDA-1088. Where should I keep my medication? Keep out of the reach of children. Store in a refrigerator between 2 and 8 degrees C (36 and 46 degrees F). Do not freeze. Do not shake. Throw away any unused portion if using a single-dose vial. Throw away any unused medicine after the expiration date. NOTE: This sheet is a summary. It may not cover all possible information. If you have questions about this medicine, talk to your doctor, pharmacist, or health care provider.  2022 Elsevier/Gold Standard (2017-11-10 16:44:20)  

## 2021-05-10 ENCOUNTER — Telehealth: Payer: Self-pay | Admitting: Physician Assistant

## 2021-05-10 NOTE — Telephone Encounter (Addendum)
   Event monitor company [employer] called the answering service after-hours today.   Chart reviewed. Per phone note 04/25/21, patient was recently seen by outside provider for weak spells so event monitor was ordered by Dr. [name]. He has history of paroxysmal atrial fibrillation as well as bradycardia previously requiring discontinuation of amiodarone. He is anticoagulated with apixaban.  Per [employer], at 9:18am CST (10:18 EST) the patient had 9 beats NSVT at 202bpm. Pre- and post-rhythm was NSR. They also reported some artifact around that time. No other events reported today and no sustained arrhythmia. I tried to call home # [phone] but goes to answering machine. His mobile just rings and voicemail is not set up. Wife's number is also the same as home number. I LMOM to call us back. Will also route to Dr. [name] so he is aware.  Charlie Pitter, PA-C

## 2021-05-13 NOTE — Telephone Encounter (Addendum)
Will route to Dr. Jacalyn Lefevre nurse to reach out to pt again to verify if any symptoms. Otherwise, Dr. [name] did acknowledge receipt of message below verbally this weekend.

## 2021-05-13 NOTE — Telephone Encounter (Signed)
Spoke with pt wife, she reports the patient did not have any issues over the weekend.

## 2021-05-21 ENCOUNTER — Telehealth: Payer: Self-pay | Admitting: Family Medicine

## 2021-05-21 NOTE — Progress Notes (Signed)
HPI: FU CAD and PAF. Previously cared for at [facility]. Catheterization 2013 showed a 99% obtuse marginal and 70% PDA. Patient had PCI of his marginal. Note based on records his disease was predominantly distal and not amenable to revascularization. Carotid Doppler September 2014 showed 1-39% bilateral stenosis. Patient also with history of atrial fibrillation. Nuclear study September 2017 showed mild inferolateral ischemia and ejection fraction 52%. Study felt to be low risk and we are treating medically. Renal ultrasound 2016 showed no aneurysm.  Recent monitor May 02, 2019 showed sinus bradycardia with PACs, PVCs and brief PAT.  Heart rate as low as 37.  Amiodarone discontinued.  Last echocardiogram May 2022 showed normal LV function, moderate left atrial enlargement, moderate aortic insufficiency.  Since last seen, patient has some chest pain with activities which is unchanged.  He denies dyspnea, orthopnea, PND.  He continues to have occasional weak spells for several hours that improves with eating.  He also has an occasional episode where he feels transiently dizzy for 1 to 2 seconds and resolve spontaneously.  He is wearing a monitor at present to further evaluate.  Current Outpatient Medications  Medication Sig Dispense Refill   Acetaminophen (TYLENOL PO) Take by mouth daily as needed.      amLODipine (NORVASC) 10 MG tablet Take 1 tablet (10 mg total) by mouth daily. 90 tablet 3   apixaban (ELIQUIS) 5 MG TABS tablet Take 1 tablet (5 mg total) by mouth 2 (two) times daily. 180 tablet 0   colchicine 0.6 MG tablet 2 tabs po on Day 1 of gout flare, then 1 tab QD until gout flare is over. 30 tablet 0   EUTHYROX 88 MCG tablet Take 1 tablet by mouth once daily 90 tablet 0   Ferrous Sulfate (IRON SUPPLEMENT PO) Take 1 tablet by mouth daily.     gabapentin (NEURONTIN) 300 MG capsule Take 600 mg by mouth 2 (two) times daily.      Latanoprost (XELPROS) 0.005 % EMUL Apply 1 drop to eye  at bedtime.     niacin (NIASPAN) 500 MG CR tablet Take 500 mg by mouth daily.     nitroGLYCERIN (NITROSTAT) 0.4 MG SL tablet DISSOLVE ONE TABLET UNDER THE TONGUE EVERY 5 MINUTES AS NEEDED FOR CHEST PAIN 100 tablet prn   omeprazole (PRILOSEC) 40 MG capsule Take 1 capsule by mouth once daily 90 capsule 3   REFRESH CELLUVISC 1 % GEL Apply 1 drop to eye 3 (three) times daily.      telmisartan (MICARDIS) 20 MG tablet Take 1 tablet (20 mg total) by mouth daily. 90 tablet 1   timolol (TIMOPTIC) 0.5 % ophthalmic solution Place 1 drop into both eyes daily.      vitamin B-12 (CYANOCOBALAMIN) 1000 MCG tablet Take 1 tablet by mouth daily.     Docusate Calcium (STOOL SOFTENER PO) Take by mouth as needed. (Patient not taking: Reported on 05/26/2021)     sodium polystyrene (KAYEXALATE) 15 GM/60ML suspension Take 60 mLs (15 g total) by mouth daily. (Patient not taking: Reported on 05/26/2021) 240 mL 0   No current facility-administered medications for this visit.     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 Dr Unk Lightning removed a larger polyp    CORONARY STENT PLACEMENT  04/2011   [facility], [facility]   ESOPHAGOGASTRODUODENOSCOPY     dr Lavina Hamman in [place] and 1989   TRANSURETHRAL RESECTION OF PROSTATE      Social History   Socioeconomic History   Marital status: Married    Spouse name: Not on file   Number of children: 3   Years of education: Not on file   Highest education level: Not on file  Occupational History   Occupation: Retired  Tobacco Use   Smoking status: Former    Types: Cigarettes    Quit date: 10/02/1984    Years since quitting: 36.6   Smokeless tobacco: Never  Vaping Use   Vaping Use: Never used  Substance and Sexual Activity   Alcohol use: No   Drug use: No   Sexual activity: Not on file  Other Topics Concern   Not on file  Social History Narrative   Not on file   Social Determinants of Health   Financial Resource Strain: Not on file  Food Insecurity: Not on file  Transportation Needs: Not on file  Physical Activity: Not on file  Stress: Not on file  Social Connections: Not on file  Intimate Partner Violence: Not on file    Family History  Problem Relation Age of Onset   Heart disease Father    Heart attack Father    Coronary artery disease Brother    Heart attack Brother 26       pacemaker and defibrillator   Coronary artery disease Brother    Leukemia Mother    Hypertension Other        family history of   Heart disease Sister    Stomach cancer Sister    Breast cancer Sister    Lung cancer Sister    Breast cancer Sister    Lung cancer Sister    Colon cancer Neg Hx    Esophageal cancer Neg Hx    Rectal cancer Neg Hx     ROS: no fevers or chills, productive cough, hemoptysis, dysphasia, odynophagia, melena, hematochezia, dysuria, hematuria, rash, seizure activity, orthopnea, PND, pedal edema, claudication. Remaining systems are negative.  Physical Exam: Well-developed well-nourished in no  acute distress.  Skin is warm and dry.  HEENT is normal.  Neck is supple.  Chest is clear to auscultation with normal expansion.  Cardiovascular exam is regular rate and rhythm.  Abdominal exam nontender or distended. No masses palpated. Extremities show no edema. neuro grossly intact  ECG-sinus bradycardia at  a rate of 47, left axis deviation, no ST changes.  Personally reviewed  A/P  1 paroxysmal atrial fibrillation-patient is in sinus today.  Amiodarone discontinued previously due to bradycardia.  Continue apixaban.  2 history of bradycardia-improved following discontinuation of amiodarone.  However patient is having occasional presyncope.  He wonders if this may be vertigo.  He has a monitor in place and we will await results.  If his symptoms correlate with bradycardia would need to consider pacemaker.  3 hypertension-blood pressure controlled.  Continue present medications and follow.  4 hyperlipidemia-intolerant to statins and Zetia.  Not interested in [place].  5 coronary artery disease-patient is not on aspirin given need for anticoagulation.  He is intolerant to statins.  He has occasional exertional chest pain which is chronic by his report.  6 chronic stage III kidney disease-followed by nephrology.  7 aortic insufficiency-plan follow-up echocardiogram May 2023.  Kirk Ruths, MD

## 2021-05-21 NOTE — Chronic Care Management (AMB) (Signed)
  Chronic Care Management   Note  05/21/2021 Name: Roberto Fields MRN: [id number] DOB: 1945-11-28  Roberto Fields is a 75 y.o. year old male who is a primary care patient of Metheney, Rene Kocher, MD. I reached out to Loleta Rose by phone today in response to a referral sent by Roberto Fields PCP, [name], MD.   Roberto Fields was given information about Chronic Care Management services today including:  CCM service includes personalized support from designated clinical staff supervised by his physician, including individualized plan of care and coordination with other care providers 24/7 contact phone numbers for assistance for urgent and routine care needs. Service will only be billed when office clinical staff spend 20 minutes or more in a month to coordinate care. Only one practitioner may furnish and bill the service in a calendar month. The patient may stop CCM services at any time (effective at the end of the month) by phone call to the office staff.   Patient agreed to services and verbal consent obtained.   Follow up plan:   Lauretta Grill [employer] Scheduler

## 2021-05-26 ENCOUNTER — Other Ambulatory Visit: Payer: PPO

## 2021-05-26 ENCOUNTER — Ambulatory Visit (INDEPENDENT_AMBULATORY_CARE_PROVIDER_SITE_OTHER): Payer: PPO | Admitting: Cardiology

## 2021-05-26 ENCOUNTER — Encounter: Payer: Self-pay | Admitting: Cardiology

## 2021-05-26 ENCOUNTER — Ambulatory Visit: Payer: PPO

## 2021-05-26 ENCOUNTER — Ambulatory Visit: Payer: PPO | Admitting: Family

## 2021-05-26 ENCOUNTER — Other Ambulatory Visit: Payer: Self-pay

## 2021-05-26 VITALS — BP 137/56 | HR 47 | Ht 68.0 in | Wt 162.0 lb

## 2021-05-26 DIAGNOSIS — I10 Essential (primary) hypertension: Secondary | ICD-10-CM | POA: Diagnosis not present

## 2021-05-26 DIAGNOSIS — I480 Paroxysmal atrial fibrillation: Secondary | ICD-10-CM

## 2021-05-26 DIAGNOSIS — I2510 Atherosclerotic heart disease of native coronary artery without angina pectoris: Secondary | ICD-10-CM

## 2021-05-26 DIAGNOSIS — I359 Nonrheumatic aortic valve disorder, unspecified: Secondary | ICD-10-CM

## 2021-05-26 MED ORDER — AMLODIPINE BESYLATE 10 MG PO TABS: 10.0000 mg | ORAL_TABLET | Freq: Every day | ORAL | 3 refills | Status: DC

## 2021-05-26 MED ORDER — TELMISARTAN 20 MG PO TABS: 20.0000 mg | ORAL_TABLET | Freq: Every day | ORAL | 3 refills | Status: AC

## 2021-05-26 MED ORDER — APIXABAN 5 MG PO TABS: 5.0000 mg | ORAL_TABLET | Freq: Two times a day (BID) | ORAL | 3 refills | Status: AC

## 2021-05-26 NOTE — Addendum Note (Signed)
Addended by: Cristopher Estimable on: 05/26/2021 11:21 AM   Modules accepted: Orders

## 2021-05-26 NOTE — Patient Instructions (Signed)
  Follow-Up: At [facility], you and your health needs are our priority.  As part of our continuing mission to provide you with exceptional heart care, we have created designated Provider Care Teams.  These Care Teams include your primary Cardiologist (physician) and Advanced Practice Providers (APPs -  Physician Assistants and Nurse Practitioners) who all work together to provide you with the care you need, when you need it.  We recommend signing up for the patient portal called "MyChart".  Sign up information is provided on this After Visit Summary.  MyChart is used to connect with patients for Virtual Visits (Telemedicine).  Patients are able to view lab/test results, encounter notes, upcoming appointments, etc.  Non-urgent messages can be sent to your provider as well.   To learn more about what you can do with MyChart, go to [email or web address].    Your next appointment:   3 month(s)  The format for your next appointment:   In Person  Provider:   Brian Crenshaw, MD    

## 2021-05-30 ENCOUNTER — Telehealth: Payer: Self-pay

## 2021-06-02 ENCOUNTER — Ambulatory Visit: Payer: PPO | Admitting: Family

## 2021-06-02 ENCOUNTER — Ambulatory Visit: Payer: PPO

## 2021-06-02 ENCOUNTER — Other Ambulatory Visit: Payer: PPO

## 2021-06-04 ENCOUNTER — Other Ambulatory Visit: Payer: Self-pay | Admitting: Family Medicine

## 2021-06-04 ENCOUNTER — Other Ambulatory Visit: Payer: Self-pay | Admitting: Cardiology

## 2021-06-04 DIAGNOSIS — R531 Weakness: Secondary | ICD-10-CM

## 2021-06-04 DIAGNOSIS — I480 Paroxysmal atrial fibrillation: Secondary | ICD-10-CM

## 2021-06-04 DIAGNOSIS — E039 Hypothyroidism, unspecified: Secondary | ICD-10-CM

## 2021-06-12 ENCOUNTER — Other Ambulatory Visit: Payer: Self-pay

## 2021-06-12 DIAGNOSIS — E039 Hypothyroidism, unspecified: Secondary | ICD-10-CM

## 2021-06-12 MED ORDER — OMEPRAZOLE 40 MG PO CPDR: 40.0000 mg | DELAYED_RELEASE_CAPSULE | Freq: Every day | ORAL | 0 refills | Status: DC

## 2021-06-12 MED ORDER — LEVOTHYROXINE SODIUM 88 MCG PO TABS: 88.0000 ug | ORAL_TABLET | Freq: Every day | ORAL | 0 refills | Status: DC

## 2021-06-12 NOTE — Telephone Encounter (Signed)
Patient has a 4month follow up appt scheduled for 09/03/21. AM

## 2021-06-12 NOTE — Telephone Encounter (Signed)
Patient will be due for a 60m appt with Metheney in October. Meds sent in to get him through until then. Please call and schedule accordingly.

## 2021-06-20 ENCOUNTER — Telehealth: Payer: Self-pay | Admitting: Pharmacist

## 2021-06-20 NOTE — Chronic Care Management (AMB) (Signed)
Chronic Care Management Pharmacy Assistant   Name: ANJELO PULLMAN  MRN: [id number] DOB: 24-Feb-1946  Roberto Fields is an 75 y.o. year old male who presents for his initial CCM visit with the clinical pharmacist.   Recent office visits:  03/04/21-Catherine Metheney, MD (PCP) Annual exam. Echocardiogram completed. Labs ordered. Follow up in 6 months. 12/30/20-Catherine Madilyn Fireman, MD (PCP) Hypertension follow up. Stop the telmisartan and replace with Losartan HCTZ 1 tab daily for one month.  Recent consult visits:  05/26/21-Brian S. Crenshaw (Cardiology) Follow up visit. Follow up in 3 months. 03/24/21-Peter R. Marin Olp, MD (Oncology) Follow up visit. Labs ordered. Follow up in 3 weeks. 03/11/21-Kirk Murdock (Ophthalmology) Notes not available. 01/29/21-Kishan Govind ([facility]) Notes not available. 01/20/21-Sarah Sallee Lange, NP (Oncology) Follow up visit. Labs ordered. Follow up in 19 weeks. 01/06/21-Lirim Tonuzi (Neurology) Follow up visit.  Follow up in 1 year. 12/31/20-Kirk Murdock (Ophthalmology) Notes Not available.  Hospital visits:  None in previous 6 months  Medications: Outpatient Encounter Medications as of 06/20/2021  Medication Sig   Acetaminophen (TYLENOL PO) Take by mouth daily as needed.    amLODipine (NORVASC) 10 MG tablet Take 1 tablet (10 mg total) by mouth daily.   apixaban (ELIQUIS) 5 MG TABS tablet Take 1 tablet (5 mg total) by mouth 2 (two) times daily.   colchicine 0.6 MG tablet 2 tabs po on Day 1 of gout flare, then 1 tab QD until gout flare is over.   Docusate Calcium (STOOL SOFTENER PO) Take by mouth as needed. (Patient not taking: Reported on 05/26/2021)   Ferrous Sulfate (IRON SUPPLEMENT PO) Take 1 tablet by mouth daily.   gabapentin (NEURONTIN) 300 MG capsule Take 600 mg by mouth 2 (two) times daily.    Latanoprost (XELPROS) 0.005 % EMUL Apply 1 drop to eye at bedtime.   levothyroxine (EUTHYROX) 88 MCG tablet Take 1 tablet (88 mcg total) by  mouth daily.   niacin (NIASPAN) 500 MG CR tablet Take 500 mg by mouth daily.   nitroGLYCERIN (NITROSTAT) 0.4 MG SL tablet DISSOLVE ONE TABLET UNDER THE TONGUE EVERY 5 MINUTES AS NEEDED FOR CHEST PAIN   omeprazole (PRILOSEC) 40 MG capsule Take 1 capsule (40 mg total) by mouth daily.   REFRESH CELLUVISC 1 % GEL Apply 1 drop to eye 3 (three) times daily.    sodium polystyrene (KAYEXALATE) 15 GM/60ML suspension Take 60 mLs (15 g total) by mouth daily. (Patient not taking: Reported on 05/26/2021)   telmisartan (MICARDIS) 20 MG tablet Take 1 tablet (20 mg total) by mouth daily.   timolol (TIMOPTIC) 0.5 % ophthalmic solution Place 1 drop into both eyes daily.    vitamin B-12 (CYANOCOBALAMIN) 1000 MCG tablet Take 1 tablet by mouth daily.   No facility-administered encounter medications on file as of 06/20/2021.   Acetaminophen (TYLENOL PO) Last filled:None noted AmLODipine (NORVASC) 10 MG tablet Last filled:04/11/21 90 DS Apixaban (ELIQUIS) 5 MG TABS tablet Last filled:05/26/21 90 DS Colchicine 0.6 MG tablet Last filled:09/05/20 30 DS Docusate Calcium (STOOL SOFTENER PO) Last filled:None noted Ferrous Sulfate (IRON SUPPLEMENT PO) Last filled:None noted Gabapentin (NEURONTIN) 300 MG capsule Last filled:05/06/21 90 DS Latanoprost (XELPROS) 0.005 % EMUL Last filled:04/15/21 90 DS Levothyroxine (EUTHYROX) 88 MCG tablet Last filled:06/12/21 90 DS Niacin (NIASPAN) 500 MG CR tablet Last filled:09/13/19 0 DS NitroGLYCERIN (NITROSTAT) 0.4 MG SL tablet Last filled:09/13/19 0 DS Omeprazole (PRILOSEC) 40 MG capsule Last filled:06/12/21 90 DS REFRESH CELLUVISC 1 % GEL Last filled:None noted Sodium polystyrene (KAYEXALATE) 15 GM/60ML  suspension Last filled:01/14/21 30 DS Telmisartan (MICARDIS) 20 MG tablet Last filled:04/21/21 90 DS Timolol (TIMOPTIC) 0.5 % ophthalmic solution Last filled:04/21/21 45 DS Vitamin B-12 (CYANOCOBALAMIN) 1000 MCG tablet Last filled:None noted   Star Rating Drugs: Telmisartan  (MICARDIS) 20 MG tablet Last filled:04/21/21 90 DS  Myriam Elta [place], [place]

## 2021-06-23 ENCOUNTER — Telehealth: Payer: Self-pay | Admitting: *Deleted

## 2021-06-23 ENCOUNTER — Other Ambulatory Visit: Payer: Self-pay

## 2021-06-23 ENCOUNTER — Inpatient Hospital Stay (HOSPITAL_BASED_OUTPATIENT_CLINIC_OR_DEPARTMENT_OTHER): Payer: PPO | Admitting: Hematology & Oncology

## 2021-06-23 ENCOUNTER — Inpatient Hospital Stay: Payer: PPO

## 2021-06-23 ENCOUNTER — Encounter: Payer: Self-pay | Admitting: Hematology & Oncology

## 2021-06-23 ENCOUNTER — Inpatient Hospital Stay: Payer: PPO | Attending: Hematology & Oncology

## 2021-06-23 VITALS — BP 119/42 | HR 51 | Temp 97.8°F | Resp 16 | Wt 163.0 lb

## 2021-06-23 DIAGNOSIS — D500 Iron deficiency anemia secondary to blood loss (chronic): Secondary | ICD-10-CM

## 2021-06-23 DIAGNOSIS — D631 Anemia in chronic kidney disease: Secondary | ICD-10-CM | POA: Diagnosis not present

## 2021-06-23 DIAGNOSIS — Z85038 Personal history of other malignant neoplasm of large intestine: Secondary | ICD-10-CM | POA: Insufficient documentation

## 2021-06-23 DIAGNOSIS — N289 Disorder of kidney and ureter, unspecified: Secondary | ICD-10-CM | POA: Insufficient documentation

## 2021-06-23 DIAGNOSIS — C187 Malignant neoplasm of sigmoid colon: Secondary | ICD-10-CM

## 2021-06-23 DIAGNOSIS — Z7901 Long term (current) use of anticoagulants: Secondary | ICD-10-CM | POA: Diagnosis not present

## 2021-06-23 DIAGNOSIS — Z79899 Other long term (current) drug therapy: Secondary | ICD-10-CM | POA: Insufficient documentation

## 2021-06-23 DIAGNOSIS — I480 Paroxysmal atrial fibrillation: Secondary | ICD-10-CM | POA: Insufficient documentation

## 2021-06-23 LAB — CBC WITH DIFFERENTIAL (CANCER CENTER ONLY)
Abs Immature Granulocytes: 0.01 10*3/uL (ref 0.00–0.07)
Basophils Absolute: 0 10*3/uL (ref 0.0–0.1)
Basophils Relative: 0 %
Eosinophils Absolute: 0.3 10*3/uL (ref 0.0–0.5)
Eosinophils Relative: 6 %
HCT: 28.7 % — ABNORMAL LOW (ref 39.0–52.0)
Hemoglobin: 9.4 g/dL — ABNORMAL LOW (ref 13.0–17.0)
Immature Granulocytes: 0 %
Lymphocytes Relative: 28 %
Lymphs Abs: 1.3 10*3/uL (ref 0.7–4.0)
MCH: 33.9 pg (ref 26.0–34.0)
MCHC: 32.8 g/dL (ref 30.0–36.0)
MCV: 103.6 fL — ABNORMAL HIGH (ref 80.0–100.0)
Monocytes Absolute: 0.4 10*3/uL (ref 0.1–1.0)
Monocytes Relative: 9 %
Neutro Abs: 2.6 10*3/uL (ref 1.7–7.7)
Neutrophils Relative %: 57 %
Platelet Count: 163 10*3/uL (ref 150–400)
RBC: 2.77 MIL/uL — ABNORMAL LOW (ref 4.22–5.81)
RDW: 13.2 % (ref 11.5–15.5)
WBC Count: 4.6 10*3/uL (ref 4.0–10.5)
nRBC: 0 % (ref 0.0–0.2)

## 2021-06-23 LAB — CMP (CANCER CENTER ONLY)
ALT: 7 U/L (ref 0–44)
AST: 9 U/L — ABNORMAL LOW (ref 15–41)
Albumin: 4.1 g/dL (ref 3.5–5.0)
Alkaline Phosphatase: 95 U/L (ref 38–126)
Anion gap: 7 (ref 5–15)
BUN: 39 mg/dL — ABNORMAL HIGH (ref 8–23)
CO2: 25 mmol/L (ref 22–32)
Calcium: 9.5 mg/dL (ref 8.9–10.3)
Chloride: 107 mmol/L (ref 98–111)
Creatinine: 2.3 mg/dL — ABNORMAL HIGH (ref 0.61–1.24)
GFR, Estimated: 29 mL/min — ABNORMAL LOW (ref >60)
Glucose, Bld: 92 mg/dL (ref 70–99)
Potassium: 5.5 mmol/L — ABNORMAL HIGH (ref 3.5–5.1)
Sodium: 139 mmol/L (ref 135–145)
Total Bilirubin: 0.5 mg/dL (ref 0.3–1.2)
Total Protein: 6 g/dL — ABNORMAL LOW (ref 6.5–8.1)

## 2021-06-23 LAB — RETICULOCYTES
Immature Retic Fract: 11.4 % (ref 2.3–15.9)
RBC.: 2.79 MIL/uL — ABNORMAL LOW (ref 4.22–5.81)
Retic Count, Absolute: 49.1 10*3/uL (ref 19.0–186.0)
Retic Ct Pct: 1.8 % (ref 0.4–3.1)

## 2021-06-23 MED ORDER — DARBEPOETIN ALFA 300 MCG/0.6ML IJ SOSY
300.0000 ug | PREFILLED_SYRINGE | Freq: Once | INTRAMUSCULAR | Status: DC
  Filled 2021-06-23: qty 0.6

## 2021-06-23 NOTE — Telephone Encounter (Signed)
Per 06/23/21 los - gave patient upcoming appointments -confirmed - print calendar

## 2021-06-23 NOTE — Progress Notes (Signed)
No aranesp injection today per md

## 2021-06-23 NOTE — Progress Notes (Signed)
Hematology and Oncology Follow Up Visit  Roberto Fields [id number] [birth year] 75 y.o. 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  Unfortunately, for visit issues with trying to get the Aranesp.  They cannot afford the Aranesp.  As such, we will have to make an adjustment with respect to when he gets Aranesp.  Currently, he is not symptomatic.  He feels okay.  He has been outside a lot.  He has a garden.  There is no fever.  He is eating well.  He is having no problems with bowels or bladder.  He has little bit of leg swelling which is not surprising.  His last iron studies back in May showed a ferritin of 76 with an iron saturation of 33%.  Currently, I would say his performance status is probably ECOG 1.  Medications:  Allergies as of 06/23/2021       Reactions   Atorvastatin Other (See Comments)   REACTION: Muscle weakness   Nsaids Other (See Comments)   Avoid with current kidney function.     Colesevelam Other (See Comments)   REACTION: joint pain   Pravastatin Other (See Comments)   Muscle aches   Simvastatin Other (See Comments)   REACTION: Myalgias        Medication List        Accurate as of June 23, 2021  1:54 PM. If you have any questions, ask your nurse or doctor.          amLODipine 10 MG tablet Commonly known as: NORVASC Take 1 tablet (10 mg total) by mouth daily.   apixaban 5 MG Tabs tablet Commonly known as: Eliquis Take 1 tablet (5 mg total) by mouth 2 (two) times daily.   colchicine 0.6 MG  tablet 2 tabs po on Day 1 of gout flare, then 1 tab QD until gout flare is over.   gabapentin 300 MG capsule Commonly known as: NEURONTIN Take 600 mg by mouth 2 (two) times daily.   IRON SUPPLEMENT PO Take 1 tablet by mouth daily.   levothyroxine 88 MCG tablet Commonly known as: Euthyrox Take 1 tablet (88 mcg total) by mouth daily.   niacin 500 MG CR tablet Commonly known as: NIASPAN Take 500 mg by mouth daily.   nitroGLYCERIN 0.4 MG SL tablet Commonly known as: NITROSTAT DISSOLVE ONE TABLET UNDER THE TONGUE EVERY 5 MINUTES AS NEEDED FOR CHEST PAIN   omeprazole 40 MG capsule Commonly known as: PRILOSEC Take 1 capsule (40 mg total) by mouth daily.   Refresh Celluvisc 1 % Gel Generic drug: Carboxymethylcellulose Sod PF Apply 1 drop to eye 3 (three) times daily.   sodium polystyrene 15 GM/60ML suspension Commonly known as: KAYEXALATE Take 60 mLs (15 g total) by mouth daily.   STOOL SOFTENER PO Take by mouth as needed.   telmisartan 20 MG tablet Commonly known as: MICARDIS Take 1 tablet (20 mg total) by mouth daily.   timolol 0.5 % ophthalmic solution Commonly known as: TIMOPTIC Place 1 drop into both eyes daily.   TYLENOL PO  Take by mouth daily as needed.   vitamin B-12 1000 MCG tablet Commonly known as: CYANOCOBALAMIN Take 1 tablet by mouth daily.   Xelpros 0.005 % Emul Generic drug: Latanoprost Apply 1 drop to eye at bedtime.        Allergies:  Allergies  Allergen Reactions   Atorvastatin Other (See Comments)    REACTION: Muscle weakness   Nsaids Other (See Comments)    Avoid with current kidney function.     Colesevelam Other (See Comments)    REACTION: joint pain   Pravastatin Other (See Comments)    Muscle aches   Simvastatin Other (See Comments)    REACTION: Myalgias    Past Medical History, Surgical history, Social history, and Family History were reviewed and updated.  Review of Systems: Review of Systems  Constitutional:  Positive  for malaise/fatigue.  HENT: Negative.    Eyes: Negative.   Respiratory:  Positive for shortness of breath.   Cardiovascular:  Positive for palpitations and leg swelling.  Gastrointestinal:  Positive for nausea.  Genitourinary: Negative.   Musculoskeletal:  Positive for joint pain and myalgias.  Skin: Negative.   Neurological: Negative.   Endo/Heme/Allergies: Negative.   Psychiatric/Behavioral: Negative.      Physical Exam:  weight is 163 lb (73.9 kg). His oral temperature is 97.8 F (36.6 C). His blood pressure is 119/42 (abnormal) and his pulse is 51 (abnormal). His respiration is 16 and oxygen saturation is 97%.   Wt Readings from Last 3 Encounters:  06/23/21 163 lb (73.9 kg)  05/26/21 162 lb (73.5 kg)  03/24/21 171 lb 1.9 oz (77.6 kg)    Physical Exam Vitals reviewed.  HENT:     Head: Normocephalic and atraumatic.  Eyes:     Pupils: Pupils are equal, round, and reactive to light.  Cardiovascular:     Rate and Rhythm: Normal rate and regular rhythm.     Heart sounds: Normal heart sounds.  Pulmonary:     Effort: Pulmonary effort is normal.     Breath sounds: Normal breath sounds.  Abdominal:     General: Bowel sounds are normal.     Palpations: Abdomen is soft.  Musculoskeletal:        General: No tenderness or deformity. Normal range of motion.     Cervical back: Normal range of motion.  Lymphadenopathy:     Cervical: No cervical adenopathy.  Skin:    General: Skin is warm and dry.     Findings: No erythema or rash.  Neurological:     Mental Status: He is alert and oriented to person, place, and time.  Psychiatric:        Behavior: Behavior normal.        Thought Content: Thought content normal.        Judgment: Judgment normal.     Lab Results  Component Value Date   WBC 4.6 06/23/2021   HGB 9.4 (L) 06/23/2021   HCT 28.7 (L) 06/23/2021   MCV 103.6 (H) 06/23/2021   PLT 163 06/23/2021   Lab Results  Component Value Date   FERRITIN 76 03/24/2021    IRON 106 03/24/2021   TIBC 318 03/24/2021   UIBC 211 03/24/2021   IRONPCTSAT 33 03/24/2021   Lab Results  Component Value Date   RETICCTPCT 1.8 06/23/2021   RBC 2.79 (L) 06/23/2021   RBC 2.77 (L) 06/23/2021   Lab Results  Component Value Date   KPAFRELGTCHN 64.7 (H) 06/27/2020   LAMBDASER 46.3 (H) 06/27/2020  KAPLAMBRATIO 1.40 06/27/2020   Lab Results  Component Value Date   IGGSERUM 649 06/27/2020   IGA 223 06/27/2020   IGMSERUM 140 06/27/2020   Lab Results  Component Value Date   TOTALPROTELP 6.3 06/27/2020     Chemistry      Component Value Date/Time   NA 139 06/23/2021 1206   NA 142 01/05/2017 0000   K 5.5 (H) 06/23/2021 1206   CL 107 06/23/2021 1206   CO2 25 06/23/2021 1206   BUN 39 (H) 06/23/2021 1206   BUN 24 (A) 01/05/2017 0000   CREATININE 2.30 (H) 06/23/2021 1206   CREATININE 1.66 (H) 09/03/2020 1143   GLU 95 01/05/2017 0000      Component Value Date/Time   CALCIUM 9.5 06/23/2021 1206   CALCIUM 9.5 09/19/2014 0000   ALKPHOS 95 06/23/2021 1206   AST 9 (L) 06/23/2021 1206   ALT 7 06/23/2021 1206   BILITOT 0.5 06/23/2021 1206       Impression and Plan: Roberto Fields is a very pleasant 75 yo caucasian gentleman with multifactorial anemia as well as history of colon cancer treated with resection in November 2020.  Again, we will have to adjust when he gets the Aranesp.  If his hemoglobin gets below 9, then we will go ahead and give the Aranesp.  I just hate the fact that he has a hard time paying for the Aranesp.  Even with insurance, it would cost quite a bit.  I will plan to see him back in a couple months.  He knows that he can come back sooner if he starts to feel fatigued or weak.  I did talk to him about the possibility of being transfused if he cannot afford Aranesp.  I am not sure he really likes that idea.    [name], MD 8/15/20221:54 PM

## 2021-06-24 ENCOUNTER — Telehealth: Payer: Self-pay

## 2021-06-24 LAB — IRON AND TIBC
Iron: 118 ug/dL (ref 42–163)
Saturation Ratios: 43 % (ref 20–55)
TIBC: 275 ug/dL (ref 202–409)
UIBC: 157 ug/dL (ref 117–376)

## 2021-06-24 LAB — TSH: TSH: 0.474 u[IU]/mL (ref 0.320–4.118)

## 2021-06-24 LAB — FERRITIN: Ferritin: 127 ng/mL (ref 24–336)

## 2021-06-24 NOTE — Telephone Encounter (Signed)
Called and informed patient's wife (ok per DPR) of lab results, wife verbalized understanding and denies any questions or concerns at this time.   

## 2021-06-24 NOTE — Telephone Encounter (Signed)
-----   Message from [name], MD sent at 06/24/2021  8:49 AM EDT ----- Call - the iron level is ok!!  Roberto Fields

## 2021-07-02 ENCOUNTER — Ambulatory Visit (INDEPENDENT_AMBULATORY_CARE_PROVIDER_SITE_OTHER): Payer: PPO | Admitting: Pharmacist

## 2021-07-02 ENCOUNTER — Other Ambulatory Visit: Payer: Self-pay

## 2021-07-02 DIAGNOSIS — I700 Atherosclerosis of aorta: Secondary | ICD-10-CM

## 2021-07-02 DIAGNOSIS — I480 Paroxysmal atrial fibrillation: Secondary | ICD-10-CM

## 2021-07-02 DIAGNOSIS — I10 Essential (primary) hypertension: Secondary | ICD-10-CM | POA: Diagnosis not present

## 2021-07-02 NOTE — Progress Notes (Signed)
Chronic Care Management Pharmacy Note  07/02/2021 Name:  Roberto Fields MRN:  [id number] DOB:  24-Dec-1945  Summary: addressed HTN, HLD, Afib  Recommendations/Changes made from today's visit: none  Plan: advised wife to call main office if desires any further follow up from pharmacist or CCM services.  Subjective: Roberto Fields is an 75 y.o. year old male who is a primary patient of Metheney, Rene Kocher, MD.  The CCM team was consulted for assistance with disease management and care coordination needs.    Engaged with patient by telephone for initial visit in response to provider referral for pharmacy case management and/or care coordination services.   Consent to Services:  The patient was given information about Chronic Care Management services, agreed to services, and gave verbal consent prior to initiation of services.  Please see initial visit note for detailed documentation.   Patient Care Team: [name], MD as PCP - General [name] [name], MD as PCP - Cardiology (Cardiology) [name], MD as Referring Physician (Cardiology) [name], MD as Referring Physician (Nephrology) [name], MD (Inactive) as Consulting Physician (Hematology) Karl Luke, MD as Referring Physician (Neurology) Darius Bump, [facility] as Pharmacist (Pharmacist)  Recent office visits:  03/04/21-Catherine Metheney, MD (PCP) Annual exam. Echocardiogram completed. Labs ordered. Follow up in 6 months. 12/30/20-Catherine Madilyn Fireman, MD (PCP) Hypertension follow up. Stop the telmisartan and replace with Losartan HCTZ 1 tab daily for one month.   Recent consult visits:  05/26/21-Brian S. Crenshaw (Cardiology) Follow up visit. Follow up in 3 months. 03/24/21-Peter R. Marin Olp, MD (Oncology) Follow up visit. Labs ordered. Follow up in 3 weeks. 03/11/21-Kirk Murdock (Ophthalmology) Notes not available. 01/29/21-Kishan Govind ([facility]) Notes not  available. 01/20/21-Sarah Sallee Lange, NP (Oncology) Follow up visit. Labs ordered. Follow up in 19 weeks. 01/06/21-Lirim Tonuzi (Neurology) Follow up visit.  Follow up in 1 year. 12/31/20-Kirk Murdock (Ophthalmology) Notes Not available.   Hospital visits:  None in previous 6 months  Objective:  Lab Results  Component Value Date   CREATININE 2.30 (H) 06/23/2021   CREATININE 1.86 (H) 05/07/2021   CREATININE 2.10 (H) 04/15/2021    Lab Results  Component Value Date   HGBA1C 5.5 08/26/2015   Last diabetic Eye exam: No results found for: HMDIABEYEEXA  Last diabetic Foot exam: No results found for: HMDIABFOOTEX      Component Value Date/Time   CHOL 187 05/27/2020 0931   TRIG 130 05/27/2020 0931   HDL 39 (L) 05/27/2020 0931   CHOLHDL 4.8 05/27/2020 0931   VLDL 35 (H) 07/27/2016 0959   LDLCALC 123 (H) 05/27/2020 0931    Hepatic Function Latest Ref Rng & Units 06/23/2021 05/07/2021 04/15/2021  Total Protein 6.5 - 8.1 g/dL 6.0(L) 6.2(L) 6.0(L)  Albumin 3.5 - 5.0 g/dL 4.1 4.1 3.9  AST 15 - 41 U/L 9(L) 11(L) 10(L)  ALT 0 - 44 U/L _0 Alk Phosphatase 38 - 126 U/L 95 95 96  Total Bilirubin 0.3 - 1.2 mg/dL 0.5 0.7 0.5  Bilirubin, Direct 0.0 - 0.2 mg/dL - - -    Lab Results  Component Value Date/Time   TSH 0.474 06/23/2021 12:06 PM   TSH 0.92 03/04/2021 11:48 AM   TSH 2.05 06/18/2020 09:52 AM   FREET4 1.40 09/06/2009 12:00 AM   FREET4 1.14 07/05/2009 10:27 AM    CBC Latest Ref Rng & Units 06/23/2021 05/07/2021 04/15/2021  WBC 4.0 - 10.5 K/uL 4.6 4.3 4.3  Hemoglobin 13.0 - 17.0 g/dL  9.4(L) 9.8(L) 9.5(L)  Hematocrit 39.0 - 52.0 % 28.7(L) 30.5(L) 29.1(L)  Platelets 150 - 400 K/uL 163 140(L) 160    No results found for: VD25OH  Clinical ASCVD: Yes  The ASCVD Risk score Mikey Bussing DC Jr., et al., 2013) failed to calculate for the following reasons:   The patient has a prior MI or stroke diagnosis    Other: (CHADS2VASc if Afib, PHQ9 if depression, MMRC or CAT for COPD, ACT,  DEXA)  Social History   Tobacco Use  Smoking Status Former   Types: Cigarettes   Quit date: 10/02/1984   Years since quitting: 36.7  Smokeless Tobacco Never   BP Readings from Last 3 Encounters:  06/23/21 (!) 119/42  05/26/21 (!) 137/56  05/07/21 (!) 118/47   Pulse Readings from Last 3 Encounters:  06/23/21 (!) 51  05/26/21 (!) 47  05/07/21 (!) 56   Wt Readings from Last 3 Encounters:  06/23/21 163 lb (73.9 kg)  05/26/21 162 lb (73.5 kg)  03/24/21 171 lb 1.9 oz (77.6 kg)    Assessment: Review of patient past medical history, allergies, medications, health status, including review of consultants reports, laboratory and other test data, was performed as part of comprehensive evaluation and provision of chronic care management services.   SDOH:  (Social Determinants of Health) assessments and interventions performed:    CCM Care Plan  Allergies  Allergen Reactions   Atorvastatin Other (See Comments)    REACTION: Muscle weakness   Nsaids Other (See Comments)    Avoid with current kidney function.     Colesevelam Other (See Comments)    REACTION: joint pain   Pravastatin Other (See Comments)    Muscle aches   Simvastatin Other (See Comments)    REACTION: Myalgias    Medications Reviewed Today     Reviewed by Melton Krebs, RN (Registered Nurse) on 06/23/21 at 1227  Med List Status: <None>   Medication Order Taking? Sig Documenting Provider Last Dose Status Informant  Acetaminophen (TYLENOL PO) [id number] No Take by mouth daily as needed.  [provider] Taking Active   amLODipine (NORVASC) 10 MG tablet [id number]  Take 1 tablet (10 mg total) by mouth daily. Lelon Perla, MD  Active   apixaban (ELIQUIS) 5 MG TABS tablet [id number]  Take 1 tablet (5 mg total) by mouth 2 (two) times daily. Lelon Perla, MD  Active   colchicine 0.6 MG tablet [id number] No 2 tabs po on Day 1 of gout flare, then 1 tab QD until gout flare is over. [name], MD  Taking Active   Docusate Calcium (STOOL SOFTENER PO) [id number] No Take by mouth as needed.  Patient not taking: Reported on 05/26/2021   [provider] Not Taking Active   Ferrous Sulfate (IRON SUPPLEMENT PO) [id number] No Take 1 tablet by mouth daily. [provider] Taking Active   gabapentin (NEURONTIN) 300 MG capsule [id number] No Take 600 mg by mouth 2 (two) times daily.  [provider] Taking Active   Latanoprost (XELPROS) 0.005 % EMUL [id number] No Apply 1 drop to eye at bedtime. [provider] Taking Active   levothyroxine (EUTHYROX) 88 MCG tablet [id number]  Take 1 tablet (88 mcg total) by mouth daily. [name], MD  Active   niacin (NIASPAN) 500 MG CR tablet [id number] No Take 500 mg by mouth daily. [provider] Taking Active   nitroGLYCERIN (NITROSTAT) 0.4 MG SL tablet [id number] No DISSOLVE ONE TABLET UNDER THE TONGUE EVERY 5 MINUTES  AS NEEDED FOR CHEST PAIN [name], MD Taking Active   omeprazole (PRILOSEC) 40 MG capsule [id number]  Take 1 capsule (40 mg total) by mouth daily. [name], MD  Active   REFRESH CELLUVISC 1 % GEL [id number] No Apply 1 drop to eye 3 (three) times daily.  [provider] Taking Active   sodium polystyrene (KAYEXALATE) 15 GM/60ML suspension [id number] No Take 60 mLs (15 g total) by mouth daily.  Patient not taking: Reported on 05/26/2021   [name], MD Not Taking Active   telmisartan (MICARDIS) 20 MG tablet [id number]  Take 1 tablet (20 mg total) by mouth daily. Lelon Perla, MD  Active   timolol (TIMOPTIC) 0.5 % ophthalmic solution [id number] No Place 1 drop into both eyes daily.  [provider] Taking Active   vitamin B-12 (CYANOCOBALAMIN) 1000 MCG tablet [id number] No Take 1 tablet by mouth daily. 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   Aortic atherosclerosis ([place]) 09/13/2019   Hyperkalemia due to CKD 4 05/15/2019   Proteinuria 12/19/2018   GERD (gastroesophageal reflux disease) 02/14/2018   Dizzinesses 12/14/2012   CAD (coronary artery disease) 05/11/2011   COPD (chronic obstructive pulmonary disease) ([place]) 05/11/2011   Atrial fibrillation ([place]) 03/04/2011   Angina pectoris ([place]) 01/19/2011   Hypothyroidism 01/28/2010   ERECTILE DYSFUNCTION, 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   History of stroke 04/22/2009   CKD (chronic kidney disease) stage 4, GFR 15-29 ml/min ([place]) 07/26/2007   HYPERLIPIDEMIA NEC/NOS 06/30/2007   VERTIGO, PERIPHERAL NOS 06/30/2007   HYPERTENSION, BENIGN ESSENTIAL 06/30/2007   LOW BACK PAIN, CHRONIC 06/30/2007    Immunization History  Administered Date(s) Administered   Fluad Quad(high Dose 65+) 07/25/2019, 09/03/2020   Influenza Split 08/10/2012   Influenza Whole 08/21/2009, 08/11/2010, 07/14/2011, 08/03/2013   Influenza, High Dose Seasonal PF 08/02/2017, 08/15/2018   Influenza,inj,Quad PF,6+ Mos 07/25/2014, 07/29/2015   Influenza-Unspecified 09/19/2016   Moderna Sars-Covid-2 Vaccination 03/20/2020, 04/26/2020   Pneumococcal Conjugate-13 04/22/2015   Pneumococcal Polysaccharide-23 07/16/2011   Td 11/10/2003   Tdap 02/14/2014   Zoster, Live 05/09/2012    Conditions to be addressed/monitored: Atrial Fibrillation, HTN, and HLD  There are no care plans that you recently modified to display for this patient.   Medication Assistance: None required.  Patient affirms current coverage meets needs.  Patient's preferred pharmacy is:  [facility] [address], [place] [address] Phone: [phone] Fax: [phone]  [facility] [place], [place] [address] Phone: [phone] Fax: [phone]  [facility], [place] [address] [place] [address] Phone: [phone] Fax: [phone]  Uses pill box? No - keeps medicines on a certain shelf and working well Pt endorses 100% compliance  Follow Up:  Patient requests no follow-up at this time.  Plan: The patient has been provided with contact information for the care management team and has been advised to call with any health related questions or concerns.   Darius Bump

## 2021-07-04 NOTE — Patient Instructions (Signed)
Visit Information   PATIENT GOALS:   Goals Addressed             This Visit's Progress    Medication Management       Patient Goals/Self-Care Activities Over the next 365 days, patient will:  take medications as prescribed  Follow Up Plan: The patient has been provided with contact information for the care management team and has been advised to call with any health related questions or concerns.  Patient/wife requested no scheduled follow up at this time.        Consent to CCM Services: [name] was given information about Chronic Care Management services including:  CCM service includes personalized support from designated clinical staff supervised by his physician, including individualized plan of care and coordination with other care providers 24/7 contact phone numbers for assistance for urgent and routine care needs. Service will only be billed when office clinical staff spend 20 minutes or more in a month to coordinate care. Only one practitioner may furnish and bill the service in a calendar month. The patient may stop CCM services at any time (effective at the end of the month) by phone call to the office staff. The patient will be responsible for cost sharing (co-pay) of up to 20% of the service fee (after annual deductible is met).  Patient agreed to services and verbal consent obtained.   The patient verbalized understanding of instructions, educational materials, and care plan provided today and agreed to receive a mailed copy of patient instructions, educational materials, and care plan.   The patient has been provided with contact information for the care management team and has been advised to call with any health related questions or concerns.   Roberto Fields   CLINICAL CARE PLAN: Patient Care Plan: Medication Management     Problem Identified: HTN, HLD, Afib      Long-Range Goal: Disease Progression Prevention   Start Date: 07/03/2021  This Visit's  Progress: On track  Priority: High  Note:   Current Barriers:  None at present  Pharmacist Clinical Goal(s):  Over the next 365 days, patient will maintain control of chronic conditions as evidenced by lab values, medication fill history, and vital signs  through collaboration with PharmD and provider.   Interventions: 1:1 collaboration with [name], MD regarding development and update of comprehensive plan of care as evidenced by provider attestation and co-signature Inter-disciplinary care team collaboration (see longitudinal plan of care) Comprehensive medication review performed; medication list updated in electronic medical record  Hypertension:  Controlled; current treatment:amlodipine 71m daily, telmisartan 273mdaily;   Current home readings: not currently checking  Denies hypotensive/hypertensive symptoms  Recommended continue current regimen,  Hyperlipidemia:  Uncontrolled; current treatment:niacin 50031maily; LDLNLG921/2021)  Medications previously tried: pravastatin, simvastatin, atorvastatin   Counseled on lifestyle/dietary choices to aid cholesterol control Recommended continue current regimen Atrial Fibrillation:  Controlled; current rate/rhythm control: rate controlled without medications; anticoagulant treatment: Eliquis 5mg70mD  Home blood pressure, heart rate readings: pt runs very low HR per wife's report, HR 50-60s all the time  Counseled on s/sx of severe bleeding and when to seek medical attention, provided eliquis cost assistance phone number for wife & patient to call and check their eligibility for assistance as they did not wish to pursue paperwork at this time. Recommended continue current regimen   Patient Goals/Self-Care Activities Over the next 365 days, patient will:  take medications as prescribed  Follow Up Plan: The patient has  been provided with contact information for the care management team and has been advised to call with any  health related questions or concerns.  Patient/wife requested no scheduled follow up at this time.

## 2021-07-15 ENCOUNTER — Telehealth (INDEPENDENT_AMBULATORY_CARE_PROVIDER_SITE_OTHER): Payer: PPO | Admitting: Family Medicine

## 2021-07-15 ENCOUNTER — Encounter: Payer: Self-pay | Admitting: Family Medicine

## 2021-07-15 DIAGNOSIS — J441 Chronic obstructive pulmonary disease with (acute) exacerbation: Secondary | ICD-10-CM | POA: Insufficient documentation

## 2021-07-15 MED ORDER — ALBUTEROL SULFATE HFA 108 (90 BASE) MCG/ACT IN AERS: 2.0000 | INHALATION_SPRAY | Freq: Four times a day (QID) | RESPIRATORY_TRACT | 0 refills | Status: AC | PRN

## 2021-07-15 MED ORDER — PREDNISONE 20 MG PO TABS: 20.0000 mg | ORAL_TABLET | Freq: Two times a day (BID) | ORAL | 0 refills | Status: AC

## 2021-07-15 MED ORDER — DOXYCYCLINE HYCLATE 100 MG PO TABS: 100.0000 mg | ORAL_TABLET | Freq: Two times a day (BID) | ORAL | 0 refills | Status: AC

## 2021-07-15 NOTE — Progress Notes (Signed)
Head congestion, cough x 9/4 morning  Took HT Cold & flu, Extra Strength Tylenol.  Home COVID = negative.

## 2021-07-15 NOTE — Assessment & Plan Note (Signed)
Recommend that he recheck test for COVID.  Instructed to contact clinic if this test returns positive.  Treating for COPD exacerbation with course of prednisone, doxycycline and albuterol inhaler as needed.  Instructed to contact clinic if having new or worsening symptoms.

## 2021-07-15 NOTE — Progress Notes (Signed)
Roberto Fields - 75 y.o. male MRN [id number]  Date of birth: 04/10/1946   This visit type was conducted due to national recommendations for restrictions regarding the COVID-19 Pandemic (e.g. social distancing).  This format is felt to be most appropriate for this patient at this time.  All issues noted in this document were discussed and addressed.  No physical exam was performed (except for noted visual exam findings with Video Visits).  I discussed the limitations of evaluation and management by telemedicine and the availability of in person appointments. The patient expressed understanding and agreed to proceed.  I connected withNAME@ on 07/15/21 at  1:50 PM EDT by a video enabled telemedicine application and verified that I am speaking with the correct person using two identifiers.  Interactive audio and video telecommunications were attempted between this provider and patient, however failed, due to patient having technical difficulties OR patient did not have access to video capability.  We continued and completed visit with audio only.    Present at visit: Luetta Nutting, DO Loleta Rose   Patient Location: Home [place] [address]   Provider location:   [facility]  Chief Complaint  Patient presents with   Cough   Nasal Congestion    HPI  Roberto Fields is a 75 y.o. male who presents via audio/video conferencing for a telehealth visit today.  He has complaint today of nasal congestion, fever, cough, wheezing with increased sputum production and mild dyspnea.  He has not had headache, body aches, nausea, chest pain, vomiting or diarrhea.  He did test for COVID at home and this was negative.  Test was approximately 2 days ago at initial onset of symptoms.  He does have a history of COPD, CAD and CKD.  He is not on any bronchodilators at this time for management of his COPD.  He has taken Tylenol Cold and flu for management of current symptoms.     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 Dr Unk Lightning removed a larger polyp   CORONARY STENT PLACEMENT  04/2011   [facility], [facility]   ESOPHAGOGASTRODUODENOSCOPY     dr Lavina Hamman in [place] and  1989   TRANSURETHRAL RESECTION OF PROSTATE      Family History  Problem Relation Age of Onset   Heart disease Father    Heart attack Father    Coronary artery disease Brother    Heart attack Brother 18       pacemaker and defibrillator   Coronary artery disease Brother    Leukemia Mother    Hypertension Other        family history of   Heart disease Sister    Stomach cancer Sister    Breast cancer Sister    Lung cancer Sister    Breast cancer Sister    Lung cancer Sister    Colon cancer Neg Hx    Esophageal cancer Neg Hx    Rectal cancer Neg Hx     Social History   Socioeconomic History   Marital status: Married    Spouse name: Not on file   Number of children: 3   Years of education:  Not on file   Highest education level: Not on file  Occupational History   Occupation: Retired  Tobacco Use   Smoking status: Former    Types: Cigarettes    Quit date: 10/02/1984    Years since quitting: 36.8   Smokeless tobacco: Never  Vaping Use   Vaping Use: Never used  Substance and Sexual Activity   Alcohol use: No   Drug use: No   Sexual activity: Not on file  Other Topics Concern   Not on file  Social History Narrative   Not on file   Social Determinants of Health   Financial Resource Strain: Not on file  Food Insecurity: Not on file  Transportation Needs: Not on file  Physical Activity: Not on file  Stress: Not on file  Social Connections: Not on file  Intimate Partner Violence: Not on file     Current Outpatient Medications:    Acetaminophen (TYLENOL PO), Take by mouth daily as needed. , Disp: , Rfl:    albuterol (VENTOLIN HFA) 108 (90 Base) MCG/ACT inhaler, Inhale 2 puffs into the lungs every 6 (six) hours as needed for wheezing or shortness of breath., Disp: 8 g, Rfl: 0   amLODipine (NORVASC) 10 MG tablet, Take 1 tablet (10 mg total) by mouth daily., Disp: 90 tablet, Rfl: 3   apixaban (ELIQUIS) 5 MG TABS tablet, Take 1 tablet (5 mg total) by mouth 2 (two) times daily., Disp: 180 tablet, Rfl: 3   colchicine 0.6 MG tablet, 2 tabs po on Day 1 of gout flare, then 1 tab QD until gout flare is over., Disp: 30 tablet, Rfl: 0   Docusate Calcium (STOOL SOFTENER PO), Take by mouth as needed., Disp: , Rfl:    doxycycline (VIBRA-TABS) 100 MG tablet, Take 1 tablet (100 mg total) by mouth 2 (two) times daily for 10 days., Disp: 20 tablet, Rfl: 0   Ferrous Sulfate (IRON SUPPLEMENT PO), Take 1 tablet by mouth daily., Disp: , Rfl:    gabapentin (NEURONTIN) 300 MG capsule, Take 600 mg by mouth 2 (two) times daily. , Disp: , Rfl:    Latanoprost (XELPROS) 0.005 % EMUL, Apply 1 drop to eye at bedtime., Disp: , Rfl:    levothyroxine (EUTHYROX) 88 MCG tablet, Take 1 tablet (88 mcg  total) by mouth daily., Disp: 90 tablet, Rfl: 0   meclizine (ANTIVERT) 25 MG tablet, Take 25 mg by mouth 3 (three) times daily as needed for dizziness., Disp: , Rfl:    niacin (NIASPAN)  500 MG CR tablet, Take 500 mg by mouth daily., Disp: , Rfl:    nitroGLYCERIN (NITROSTAT) 0.4 MG SL tablet, DISSOLVE ONE TABLET UNDER THE TONGUE EVERY 5 MINUTES AS NEEDED FOR CHEST PAIN, Disp: 100 tablet, Rfl: prn   omeprazole (PRILOSEC) 40 MG capsule, Take 1 capsule (40 mg total) by mouth daily., Disp: 90 capsule, Rfl: 0   predniSONE (DELTASONE) 20 MG tablet, Take 1 tablet (20 mg total) by mouth 2 (two) times daily with a meal for 5 days., Disp: 10 tablet, Rfl: 0   REFRESH CELLUVISC 1 % GEL, Apply 1 drop to eye 3 (three) times daily. , Disp: , Rfl:    sodium polystyrene (KAYEXALATE) 15 GM/60ML suspension, Take 60 mLs (15 g total) by mouth daily., Disp: 240 mL, Rfl: 0   telmisartan (MICARDIS) 20 MG tablet, Take 1 tablet (20 mg total) by mouth daily., Disp: 90 tablet, Rfl: 3   timolol (TIMOPTIC) 0.5 % ophthalmic solution, Place 1 drop into both eyes daily. , Disp: , Rfl:    vitamin B-12 (CYANOCOBALAMIN) 1000 MCG tablet, Take 1 tablet by mouth daily., Disp: , Rfl:   EXAM:  VITALS per patient if applicable: BP 412/87   Pulse 70   Temp 98.2 F (36.8 C)   Ht 5\' 8"  (1.727 m)   Wt 163 lb (73.9 kg)   BMI 24.78 kg/m   GENERAL: alert, oriented, in no acute distress   LUNGS: on inspection no signs of respiratory distressno obvious gross SOB, gasping or wheezing  PSYCH/NEURO: pleasant and cooperative, no obvious depression or anxiety, speech and thought processing grossly intact  ASSESSMENT AND PLAN:  Discussed the following assessment and plan:  COPD with exacerbation ([place]) Recommend that he recheck test for COVID.  Instructed to contact clinic if this test returns positive.  Treating for COPD exacerbation with course of prednisone, doxycycline and albuterol inhaler as needed.  Instructed to contact clinic if  having new or worsening symptoms.     I discussed the assessment and treatment plan with the patient. The patient was provided an opportunity to ask questions and all were answered. The patient agreed with the plan and demonstrated an understanding of the instructions.   The patient was advised to call back or seek an in-person evaluation if the symptoms worsen or if the condition fails to improve as anticipated.  30 minutes spent including pre visit preparation, review of prior notes and labs, encounter with patient via telephone visit and same day documentation.   Luetta Nutting, DO

## 2021-08-06 NOTE — Progress Notes (Signed)
HPI: FU CAD and PAF. Previously cared for at [facility]. Catheterization 2013 showed a 99% obtuse marginal and 70% PDA. Patient had PCI of his marginal. Note based on records his disease was predominantly distal and not amenable to revascularization. Carotid Doppler September 2014 showed 1-39% bilateral stenosis. Patient also with history of atrial fibrillation. Nuclear study September 2017 showed mild inferolateral ischemia and ejection fraction 52%. Study felt to be low risk and we are treating medically. Renal ultrasound 2016 showed no aneurysm.  Monitor May 02, 2019 showed sinus bradycardia with PACs, PVCs and brief PAT.  Heart rate as low as 37.  Amiodarone discontinued.  Last echocardiogram May 2022 showed normal LV function, moderate left atrial enlargement, moderate aortic insufficiency.  Monitor July 2022 shows sinus bradycardia, normal sinus rhythm, sinus tachycardia, occasional PACs, nonsustained ventricular tachycardia.  Beta-blocker not added due to history of bradycardia.  Since last seen, he has occasional chest pain relieved with nitroglycerin which is longstanding and unchanged in severity or frequency.  He has some dyspnea on exertion but no orthopnea or PND.  Minimal pedal edema.  He has a "weak spells" that last 2 to 3 hours at a time.  He has had no presyncope or syncope.  Current Outpatient Medications  Medication Sig Dispense Refill   Acetaminophen (TYLENOL PO) Take by mouth daily as needed.      albuterol (VENTOLIN HFA) 108 (90 Base) MCG/ACT inhaler Inhale 2 puffs into the lungs every 6 (six) hours as needed for wheezing or shortness of breath. 8 g 0   amLODipine (NORVASC) 10 MG tablet Take 1 tablet (10 mg total) by mouth daily. 90 tablet 3   apixaban (ELIQUIS) 5 MG TABS tablet Take 1 tablet (5 mg total) by mouth 2 (two) times daily. 180 tablet 3   colchicine 0.6 MG tablet 2 tabs po on Day 1 of gout flare, then 1 tab QD until gout flare is over. 30 tablet 0    Docusate Calcium (STOOL SOFTENER PO) Take by mouth as needed.     Ferrous Sulfate (IRON SUPPLEMENT PO) Take 1 tablet by mouth daily.     gabapentin (NEURONTIN) 300 MG capsule Take 600 mg by mouth 2 (two) times daily.      Latanoprost (XELPROS) 0.005 % EMUL Apply 1 drop to eye at bedtime.     levothyroxine (EUTHYROX) 88 MCG tablet Take 1 tablet (88 mcg total) by mouth daily. 90 tablet 0   meclizine (ANTIVERT) 25 MG tablet Take 25 mg by mouth 3 (three) times daily as needed for dizziness.     niacin (NIASPAN) 500 MG CR tablet Take 500 mg by mouth daily.     nitroGLYCERIN (NITROSTAT) 0.4 MG SL tablet DISSOLVE ONE TABLET UNDER THE TONGUE EVERY 5 MINUTES AS NEEDED FOR CHEST PAIN 100 tablet prn   omeprazole (PRILOSEC) 40 MG capsule Take 1 capsule (40 mg total) by mouth daily. 90 capsule 0   REFRESH CELLUVISC 1 % GEL Apply 1 drop to eye 3 (three) times daily.      sodium polystyrene (KAYEXALATE) 15 GM/60ML suspension Take 60 mLs (15 g total) by mouth daily. 240 mL 0   telmisartan (MICARDIS) 20 MG tablet Take 1 tablet (20 mg total) by mouth daily. 90 tablet 3   timolol (TIMOPTIC) 0.5 % ophthalmic solution Place 1 drop into both eyes daily.      vitamin B-12 (CYANOCOBALAMIN) 1000 MCG tablet Take 1 tablet by mouth daily.     No  current facility-administered medications for this visit.     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 Dr Unk Lightning removed a larger polyp   CORONARY STENT PLACEMENT  04/2011   [facility]AD, [facility]   ESOPHAGOGASTRODUODENOSCOPY     dr Lavina Hamman in [place] and 1989   TRANSURETHRAL RESECTION OF PROSTATE      Social History   Socioeconomic History   Marital status: Married    Spouse name: Not on file   Number of children: 3   Years of education: Not on file   Highest education level: Not on file  Occupational History   Occupation: Retired  Tobacco Use   Smoking status: Former    Types: Cigarettes    Quit date: 10/02/1984    Years since quitting: 36.8   Smokeless tobacco: Never  Vaping Use   Vaping Use: Never used  Substance and Sexual Activity   Alcohol use: No   Drug use: No   Sexual activity: Not on file  Other Topics Concern   Not on file  Social History Narrative   Not on file   Social Determinants of Health   Financial Resource Strain: Not on file  Food Insecurity: Not on file  Transportation Needs: Not on file  Physical Activity: Not on file  Stress: Not on file  Social Connections: Not on file  Intimate Partner Violence: Not on file    Family History  Problem Relation Age of Onset   Heart disease Father    Heart attack Father    Coronary artery disease Brother    Heart attack Brother 55       pacemaker and defibrillator   Coronary artery disease Brother    Leukemia Mother    Hypertension Other        family history of   Heart disease Sister    Stomach cancer Sister    Breast cancer Sister    Lung cancer Sister    Breast cancer Sister    Lung cancer Sister    Colon cancer Neg Hx     Esophageal cancer Neg Hx    Rectal cancer Neg Hx     ROS: no fevers or chills, productive cough, hemoptysis, dysphasia, odynophagia, melena, hematochezia, dysuria, hematuria, rash, seizure activity, orthopnea, PND,  pedal edema, claudication. Remaining systems are negative.  Physical Exam: Well-developed well-nourished in no acute distress.  Skin is warm and dry.  HEENT is normal.  Neck is supple.  Chest is clear to auscultation with normal expansion.  Cardiovascular exam is regular rate and rhythm.  2/6 systolic murmur left sternal border. Abdominal exam nontender or distended. No masses palpated. Extremities show trace edema. neuro grossly intact  A/P  1 paroxysmal atrial fibrillation-patient remains in sinus today.  Continue apixaban.  Amiodarone was discontinued previously due to bradycardia.  2 bradycardia-improved following discontinuation of amiodarone.  He was noted to have nonsustained ventricular tachycardia on recent monitor but beta-blocker was not added given history of bradycardia.  3 hypertension-blood pressure controlled.  Continue present medical regimen.  4 hyperlipidemia-patient is intolerant to statins and Zetia.  He declined Repatha.  5 coronary artery disease-patient is intolerant to statins.  Not on aspirin given need for anticoagulation.  6 aortic insufficiency-we will plan follow-up echocardiogram May 2023.  7 chronic stage III kidney disease-Per nephrology.  Kirk Ruths, MD

## 2021-08-13 ENCOUNTER — Encounter: Payer: Self-pay | Admitting: Cardiology

## 2021-08-13 ENCOUNTER — Other Ambulatory Visit: Payer: Self-pay

## 2021-08-13 ENCOUNTER — Ambulatory Visit: Payer: PPO | Admitting: Cardiology

## 2021-08-13 VITALS — BP 129/56 | HR 61 | Ht 68.0 in | Wt 166.1 lb

## 2021-08-13 DIAGNOSIS — E7800 Pure hypercholesterolemia, unspecified: Secondary | ICD-10-CM | POA: Diagnosis not present

## 2021-08-13 DIAGNOSIS — R531 Weakness: Secondary | ICD-10-CM

## 2021-08-13 DIAGNOSIS — I10 Essential (primary) hypertension: Secondary | ICD-10-CM

## 2021-08-13 DIAGNOSIS — I359 Nonrheumatic aortic valve disorder, unspecified: Secondary | ICD-10-CM

## 2021-08-13 DIAGNOSIS — I480 Paroxysmal atrial fibrillation: Secondary | ICD-10-CM

## 2021-08-13 DIAGNOSIS — I2510 Atherosclerotic heart disease of native coronary artery without angina pectoris: Secondary | ICD-10-CM | POA: Diagnosis not present

## 2021-08-13 NOTE — Patient Instructions (Signed)

## 2021-08-18 ENCOUNTER — Telehealth: Payer: Self-pay | Admitting: *Deleted

## 2021-08-18 ENCOUNTER — Inpatient Hospital Stay: Payer: PPO | Attending: Hematology & Oncology

## 2021-08-18 ENCOUNTER — Inpatient Hospital Stay: Payer: PPO | Admitting: Hematology & Oncology

## 2021-08-18 ENCOUNTER — Other Ambulatory Visit: Payer: Self-pay

## 2021-08-18 ENCOUNTER — Encounter: Payer: Self-pay | Admitting: Hematology & Oncology

## 2021-08-18 ENCOUNTER — Inpatient Hospital Stay: Payer: PPO

## 2021-08-18 VITALS — BP 139/44 | HR 57 | Temp 97.8°F | Resp 18 | Ht 68.0 in | Wt 164.1 lb

## 2021-08-18 DIAGNOSIS — D500 Iron deficiency anemia secondary to blood loss (chronic): Secondary | ICD-10-CM

## 2021-08-18 DIAGNOSIS — I480 Paroxysmal atrial fibrillation: Secondary | ICD-10-CM | POA: Diagnosis not present

## 2021-08-18 DIAGNOSIS — N289 Disorder of kidney and ureter, unspecified: Secondary | ICD-10-CM | POA: Insufficient documentation

## 2021-08-18 DIAGNOSIS — D631 Anemia in chronic kidney disease: Secondary | ICD-10-CM | POA: Diagnosis not present

## 2021-08-18 DIAGNOSIS — Z7901 Long term (current) use of anticoagulants: Secondary | ICD-10-CM | POA: Insufficient documentation

## 2021-08-18 DIAGNOSIS — Z79899 Other long term (current) drug therapy: Secondary | ICD-10-CM | POA: Insufficient documentation

## 2021-08-18 DIAGNOSIS — Z85038 Personal history of other malignant neoplasm of large intestine: Secondary | ICD-10-CM | POA: Diagnosis not present

## 2021-08-18 DIAGNOSIS — C187 Malignant neoplasm of sigmoid colon: Secondary | ICD-10-CM

## 2021-08-18 LAB — CMP (CANCER CENTER ONLY)
ALT: 7 U/L (ref 0–44)
AST: 10 U/L — ABNORMAL LOW (ref 15–41)
Albumin: 4.1 g/dL (ref 3.5–5.0)
Alkaline Phosphatase: 101 U/L (ref 38–126)
Anion gap: 7 (ref 5–15)
BUN: 33 mg/dL — ABNORMAL HIGH (ref 8–23)
CO2: 25 mmol/L (ref 22–32)
Calcium: 9.7 mg/dL (ref 8.9–10.3)
Chloride: 108 mmol/L (ref 98–111)
Creatinine: 2.14 mg/dL — ABNORMAL HIGH (ref 0.61–1.24)
GFR, Estimated: 31 mL/min — ABNORMAL LOW (ref >60)
Glucose, Bld: 89 mg/dL (ref 70–99)
Potassium: 5.1 mmol/L (ref 3.5–5.1)
Sodium: 140 mmol/L (ref 135–145)
Total Bilirubin: 0.4 mg/dL (ref 0.3–1.2)
Total Protein: 6.4 g/dL — ABNORMAL LOW (ref 6.5–8.1)

## 2021-08-18 LAB — CBC WITH DIFFERENTIAL (CANCER CENTER ONLY)
Abs Immature Granulocytes: 0.01 10*3/uL (ref 0.00–0.07)
Basophils Absolute: 0 10*3/uL (ref 0.0–0.1)
Basophils Relative: 0 %
Eosinophils Absolute: 0.1 10*3/uL (ref 0.0–0.5)
Eosinophils Relative: 2 %
HCT: 25.3 % — ABNORMAL LOW (ref 39.0–52.0)
Hemoglobin: 8.2 g/dL — ABNORMAL LOW (ref 13.0–17.0)
Immature Granulocytes: 0 %
Lymphocytes Relative: 29 %
Lymphs Abs: 1.2 10*3/uL (ref 0.7–4.0)
MCH: 34.6 pg — ABNORMAL HIGH (ref 26.0–34.0)
MCHC: 32.4 g/dL (ref 30.0–36.0)
MCV: 106.8 fL — ABNORMAL HIGH (ref 80.0–100.0)
Monocytes Absolute: 0.4 10*3/uL (ref 0.1–1.0)
Monocytes Relative: 9 %
Neutro Abs: 2.4 10*3/uL (ref 1.7–7.7)
Neutrophils Relative %: 60 %
Platelet Count: 184 10*3/uL (ref 150–400)
RBC: 2.37 MIL/uL — ABNORMAL LOW (ref 4.22–5.81)
RDW: 13.7 % (ref 11.5–15.5)
WBC Count: 4 10*3/uL (ref 4.0–10.5)
nRBC: 0 % (ref 0.0–0.2)

## 2021-08-18 LAB — RETICULOCYTES
Immature Retic Fract: 14.8 % (ref 2.3–15.9)
RBC.: 2.37 MIL/uL — ABNORMAL LOW (ref 4.22–5.81)
Retic Count, Absolute: 69.9 10*3/uL (ref 19.0–186.0)
Retic Ct Pct: 3 % (ref 0.4–3.1)

## 2021-08-18 MED ORDER — DARBEPOETIN ALFA 300 MCG/0.6ML IJ SOSY
300.0000 ug | PREFILLED_SYRINGE | Freq: Once | INTRAMUSCULAR | Status: AC
  Administered 2021-08-18: 300 ug via SUBCUTANEOUS
  Filled 2021-08-18: qty 0.6

## 2021-08-18 NOTE — Patient Instructions (Signed)
Darbepoetin Alfa injection What is this medicine? DARBEPOETIN ALFA (dar be POE e tin AL fa) helps your body make more red blood cells. It is used to treat anemia caused by chronic kidney failure and chemotherapy. This medicine may be used for other purposes; ask your health care provider or pharmacist if you have questions. COMMON BRAND NAME(S): Aranesp What should I tell my health care provider before I take this medicine? They need to know if you have any of these conditions:  blood clotting disorders or history of blood clots  cancer patient not on chemotherapy  cystic fibrosis  heart disease, such as angina, heart failure, or a history of a heart attack  hemoglobin level of 12 g/dL or greater  high blood pressure  low levels of folate, iron, or vitamin B12  seizures  an unusual or allergic reaction to darbepoetin, erythropoietin, albumin, hamster proteins, latex, other medicines, foods, dyes, or preservatives  pregnant or trying to get pregnant  breast-feeding How should I use this medicine? This medicine is for injection into a vein or under the skin. It is usually given by a health care professional in a hospital or clinic setting. If you get this medicine at home, you will be taught how to prepare and give this medicine. Use exactly as directed. Take your medicine at regular intervals. Do not take your medicine more often than directed. It is important that you put your used needles and syringes in a special sharps container. Do not put them in a trash can. If you do not have a sharps container, call your pharmacist or healthcare provider to get one. A special MedGuide will be given to you by the pharmacist with each prescription and refill. Be sure to read this information carefully each time. Talk to your pediatrician regarding the use of this medicine in children. While this medicine may be used in children as young as 1 month of age for selected conditions, precautions do  apply. Overdosage: If you think you have taken too much of this medicine contact a poison control center or emergency room at once. NOTE: This medicine is only for you. Do not share this medicine with others. What if I miss a dose? If you miss a dose, take it as soon as you can. If it is almost time for your next dose, take only that dose. Do not take double or extra doses. What may interact with this medicine? Do not take this medicine with any of the following medications:  epoetin alfa This list may not describe all possible interactions. Give your health care provider a list of all the medicines, herbs, non-prescription drugs, or dietary supplements you use. Also tell them if you smoke, drink alcohol, or use illegal drugs. Some items may interact with your medicine. What should I watch for while using this medicine? Your condition will be monitored carefully while you are receiving this medicine. You may need blood work done while you are taking this medicine. This medicine may cause a decrease in vitamin B6. You should make sure that you get enough vitamin B6 while you are taking this medicine. Discuss the foods you eat and the vitamins you take with your health care professional. What side effects may I notice from receiving this medicine? Side effects that you should report to your doctor or health care professional as soon as possible:  allergic reactions like skin rash, itching or hives, swelling of the face, lips, or tongue  breathing problems  changes in   vision  chest pain  confusion, trouble speaking or understanding  feeling faint or lightheaded, falls  high blood pressure  muscle aches or pains  pain, swelling, warmth in the leg  rapid weight gain  severe headaches  sudden numbness or weakness of the face, arm or leg  trouble walking, dizziness, loss of balance or coordination  seizures (convulsions)  swelling of the ankles, feet, hands  unusually weak or  tired Side effects that usually do not require medical attention (report to your doctor or health care professional if they continue or are bothersome):  diarrhea  fever, chills (flu-like symptoms)  headaches  nausea, vomiting  redness, stinging, or swelling at site where injected This list may not describe all possible side effects. Call your doctor for medical advice about side effects. You may report side effects to FDA at 1-800-FDA-1088. Where should I keep my medicine? Keep out of the reach of children. Store in a refrigerator between 2 and 8 degrees C (36 and 46 degrees F). Do not freeze. Do not shake. Throw away any unused portion if using a single-dose vial. Throw away any unused medicine after the expiration date. NOTE: This sheet is a summary. It may not cover all possible information. If you have questions about this medicine, talk to your doctor, pharmacist, or health care provider.  2021 Elsevier/Gold Standard (2017-11-10 16:44:20)  

## 2021-08-18 NOTE — Telephone Encounter (Signed)
Per 08/18/21 los - gave upcoming appointments - confirmed

## 2021-08-18 NOTE — Progress Notes (Signed)
Hematology and Oncology Follow Up Visit  Roberto Fields [id number] 03-10-1946 75 y.o. 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  Unfortunately, his hemoglobin is less than 9 now.  Hemoglobin is 8.2.  He is really not that symptomatic.  However, he clearly needs to have Aranesp.  I realize that this has been a financial burden for them.  I really want to try to make life a little easier for him.  However, at hemoglobin 8.2, that is just too low.  He has had no bleeding.  He is on anticoagulation.  He has paroxysmal atrial fibrillation.  He has had no obvious change in bowel or bladder habits.  He has occasional diarrhea.   He has had no cough.  There is no shortness of breath.  He has had no rashes.  There is been no leg swelling.  When we last saw him, his ferritin was 127 with an iron saturation of 43%.  Currently, I would say his performance status is probably ECOG 1.  Medications:  Allergies as of 08/18/2021       Reactions   Atorvastatin Other (See Comments)   REACTION: Muscle weakness   Nsaids Other (See Comments)   Avoid with current kidney function.     Colesevelam Other (See Comments)   REACTION: joint pain   Pravastatin Other (See Comments)   Muscle aches   Simvastatin Other (See Comments)   REACTION: Myalgias        Medication List        Accurate as of August 18, 2021 12:49 PM. If you have any questions, ask your nurse or doctor.          STOP taking these medications    sodium polystyrene 15 GM/60ML  suspension Commonly known as: KAYEXALATE Stopped by: [name], MD       TAKE these medications    albuterol 108 (90 Base) MCG/ACT inhaler Commonly known as: VENTOLIN HFA Inhale 2 puffs into the lungs every 6 (six) hours as needed for wheezing or shortness of breath.   amLODipine 10 MG tablet Commonly known as: NORVASC Take 1 tablet (10 mg total) by mouth daily.   apixaban 5 MG Tabs tablet Commonly known as: Eliquis Take 1 tablet (5 mg total) by mouth 2 (two) times daily.   colchicine 0.6 MG tablet 2 tabs po on Day 1 of gout flare, then 1 tab QD until gout flare is over.   gabapentin 300 MG capsule Commonly known as: NEURONTIN Take 600 mg by mouth 2 (two) times daily.   IRON SUPPLEMENT PO Take 1 tablet by mouth daily.   levothyroxine 88 MCG tablet Commonly known as: Euthyrox Take 1 tablet (88 mcg total) by mouth daily.   meclizine 25 MG tablet Commonly known as: ANTIVERT Take 25 mg by mouth 3 (three) times daily as needed for dizziness.   niacin 500 MG CR tablet Commonly known as: NIASPAN Take 500 mg by mouth daily.   nitroGLYCERIN 0.4 MG SL tablet Commonly known as: NITROSTAT DISSOLVE ONE TABLET UNDER  THE TONGUE EVERY 5 MINUTES AS NEEDED FOR CHEST PAIN   omeprazole 40 MG capsule Commonly known as: PRILOSEC Take 1 capsule (40 mg total) by mouth daily.   Refresh Celluvisc 1 % Gel Generic drug: Carboxymethylcellulose Sod PF Apply 1 drop to eye 3 (three) times daily.   STOOL SOFTENER PO Take by mouth as needed.   telmisartan 20 MG tablet Commonly known as: MICARDIS Take 1 tablet (20 mg total) by mouth daily.   timolol 0.5 % ophthalmic solution Commonly known as: TIMOPTIC Place 1 drop into both eyes daily.   TYLENOL PO Take by mouth daily as needed.   vitamin B-12 1000 MCG tablet Commonly known as: CYANOCOBALAMIN Take 1 tablet by mouth daily.   Xelpros 0.005 % Emul Generic drug: Latanoprost Apply 1 drop to eye at bedtime.         Allergies:  Allergies  Allergen Reactions   Atorvastatin Other (See Comments)    REACTION: Muscle weakness   Nsaids Other (See Comments)    Avoid with current kidney function.     Colesevelam Other (See Comments)    REACTION: joint pain   Pravastatin Other (See Comments)    Muscle aches   Simvastatin Other (See Comments)    REACTION: Myalgias    Past Medical History, Surgical history, Social history, and Family History were reviewed and updated.  Review of Systems: Review of Systems  Constitutional:  Positive for malaise/fatigue.  HENT: Negative.    Eyes: Negative.   Respiratory:  Positive for shortness of breath.   Cardiovascular:  Positive for palpitations and leg swelling.  Gastrointestinal:  Positive for nausea.  Genitourinary: Negative.   Musculoskeletal:  Positive for joint pain and myalgias.  Skin: Negative.   Neurological: Negative.   Endo/Heme/Allergies: Negative.   Psychiatric/Behavioral: Negative.      Physical Exam:  height is 5\' 8"  (1.727 m) and weight is 164 lb 1.3 oz (74.4 kg). His oral temperature is 97.8 F (36.6 C). His blood pressure is 139/44 (abnormal) and his pulse is 57 (abnormal). His respiration is 18 and oxygen saturation is 100%.   Wt Readings from Last 3 Encounters:  08/18/21 164 lb 1.3 oz (74.4 kg)  08/13/21 166 lb 1.9 oz (75.4 kg)  07/15/21 163 lb (73.9 kg)    Physical Exam Vitals reviewed.  HENT:     Head: Normocephalic and atraumatic.  Eyes:     Pupils: Pupils are equal, round, and reactive to light.  Cardiovascular:     Rate and Rhythm: Normal rate and regular rhythm.     Heart sounds: Normal heart sounds.  Pulmonary:     Effort: Pulmonary effort is normal.     Breath sounds: Normal breath sounds.  Abdominal:     General: Bowel sounds are normal.     Palpations: Abdomen is soft.  Musculoskeletal:        General: No tenderness or deformity. Normal range of motion.     Cervical back: Normal range of motion.   Lymphadenopathy:     Cervical: No cervical adenopathy.  Skin:    General: Skin is warm and dry.     Findings: No erythema or rash.  Neurological:     Mental Status: He is alert and oriented to person, place, and time.  Psychiatric:        Behavior: Behavior normal.        Thought Content: Thought content normal.        Judgment: Judgment normal.     Lab Results  Component Value Date   WBC 4.0 08/18/2021   HGB 8.2 (L) 08/18/2021   HCT 25.3 (L) 08/18/2021   MCV 106.8 (H) 08/18/2021   PLT 184 08/18/2021   Lab Results  Component Value Date   FERRITIN 127 06/23/2021   IRON 118 06/23/2021   TIBC 275 06/23/2021   UIBC 157 06/23/2021   IRONPCTSAT 43 06/23/2021   Lab Results  Component Value Date   RETICCTPCT 3.0 08/18/2021   RBC 2.37 (L) 08/18/2021   RBC 2.37 (L) 08/18/2021   Lab Results  Component Value Date   KPAFRELGTCHN 64.7 (H) 06/27/2020   LAMBDASER 46.3 (H) 06/27/2020   KAPLAMBRATIO 1.40 06/27/2020   Lab Results  Component Value Date   IGGSERUM 649 06/27/2020   IGA 223 06/27/2020   IGMSERUM 140 06/27/2020   Lab Results  Component Value Date   TOTALPROTELP 6.3 06/27/2020     Chemistry      Component Value Date/Time   NA 140 08/18/2021 1204   NA 142 01/05/2017 0000   K 5.1 08/18/2021 1204   CL 108 08/18/2021 1204   CO2 25 08/18/2021 1204   BUN 33 (H) 08/18/2021 1204   BUN 24 (A) 01/05/2017 0000   CREATININE 2.14 (H) 08/18/2021 1204   CREATININE 1.66 (H) 09/03/2020 1143   GLU 95 01/05/2017 0000      Component Value Date/Time   CALCIUM 9.7 08/18/2021 1204   CALCIUM 9.5 09/19/2014 0000   ALKPHOS 101 08/18/2021 1204   AST 10 (L) 08/18/2021 1204   ALT 7 08/18/2021 1204   BILITOT 0.4 08/18/2021 1204       Impression and Plan: Roberto Fields is a very pleasant 75 yo caucasian gentleman with multifactorial anemia as well as history of colon cancer treated with resection in November 2020.  He will get Aranesp today.  Again we want to give Aranesp when  hemoglobin less than 9.  I think that if we get his hemoglobin around 10, this would be wonderful.  I would like to have him come back to see me in about 5 weeks or so.  I want to make sure that we try to get his blood up as much as possible before the holidays begin.   [name], MD 10/10/202212:49 PM

## 2021-08-18 NOTE — Telephone Encounter (Signed)
Contacted Madalyn Rob regarding Aranesp papers that patient has filled out but wasn't sure who he gave them to.  Colletta [place] will contact patient to initiate paperwork.

## 2021-08-19 LAB — IRON AND TIBC
Iron: 84 ug/dL (ref 42–163)
Saturation Ratios: 28 % (ref 20–55)
TIBC: 299 ug/dL (ref 202–409)
UIBC: 215 ug/dL (ref 117–376)

## 2021-08-19 LAB — CEA (IN HOUSE-CHCC): CEA (CHCC-In House): <1 ng/mL (ref 0.00–5.00)

## 2021-08-19 LAB — FERRITIN: Ferritin: 73 ng/mL (ref 24–336)

## 2021-09-01 ENCOUNTER — Other Ambulatory Visit: Payer: Self-pay | Admitting: Family Medicine

## 2021-09-03 ENCOUNTER — Encounter: Payer: Self-pay | Admitting: Family Medicine

## 2021-09-03 ENCOUNTER — Ambulatory Visit (INDEPENDENT_AMBULATORY_CARE_PROVIDER_SITE_OTHER): Payer: PPO | Admitting: Family Medicine

## 2021-09-03 VITALS — BP 136/39 | HR 53 | Ht 68.0 in | Wt 162.0 lb

## 2021-09-03 DIAGNOSIS — M65331 Trigger finger, right middle finger: Secondary | ICD-10-CM | POA: Diagnosis not present

## 2021-09-03 DIAGNOSIS — I10 Essential (primary) hypertension: Secondary | ICD-10-CM | POA: Diagnosis not present

## 2021-09-03 DIAGNOSIS — Z23 Encounter for immunization: Secondary | ICD-10-CM

## 2021-09-03 DIAGNOSIS — I700 Atherosclerosis of aorta: Secondary | ICD-10-CM | POA: Diagnosis not present

## 2021-09-03 DIAGNOSIS — D500 Iron deficiency anemia secondary to blood loss (chronic): Secondary | ICD-10-CM | POA: Diagnosis not present

## 2021-09-03 DIAGNOSIS — N184 Chronic kidney disease, stage 4 (severe): Secondary | ICD-10-CM | POA: Diagnosis not present

## 2021-09-03 NOTE — Progress Notes (Signed)
Established Patient Office Visit  Subjective:  Patient ID: Roberto Fields, male    DOB: 1945/12/31  Age: 75 y.o. MRN: [id number]  CC: No chief complaint on file.   HPI Roberto Fields presents for a month follow-up.  Hypertension- Pt denies chest pain, SOB, dizziness, or heart palpitations.  Taking meds as directed w/o problems.  Denies medication side effects.    F/U CKD-last serum creatinine this month with is 2.1 with a BUN of 33.  He is actually been pretty stable.  GFR of 31.  Recent iron levels look good.  Is a follow-up with nephrology next month.  Has been getting iron infusions intermittently.  Also been trying to increase his iron through his diet and has been taking an iron tablet,  He also complains of a trigger finger on his right hand he says been bothering him for about 2 to 3 months.  It feels a little bit sore over his palm.  No recent injury or trauma.  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 Dr Unk Lightning removed a larger polyp   CORONARY STENT PLACEMENT  04/2011   [facility], [facility]    ESOPHAGOGASTRODUODENOSCOPY     dr gibbs in [place] and 1989   TRANSURETHRAL RESECTION OF PROSTATE      Family History  Problem Relation Age of Onset   Heart disease Father    Heart attack Father    Coronary artery disease Brother    Heart attack Brother 18       pacemaker and defibrillator   Coronary artery disease Brother    Leukemia Mother    Hypertension Other        family history of   Heart disease Sister    Stomach cancer Sister    Breast cancer Sister    Lung cancer Sister    Breast cancer Sister    Lung cancer Sister    Colon cancer Neg Hx    Esophageal cancer Neg Hx    Rectal cancer Neg Hx     Social History   Socioeconomic History   Marital status: Married    Spouse name: Not on file   Number of children: 3   Years of education: Not on file   Highest education level:  Not on file  Occupational History   Occupation: Retired  Tobacco Use   Smoking status: Former    Types: Cigarettes    Quit date: 10/02/1984    Years since quitting: 36.9   Smokeless tobacco: Never  Vaping Use   Vaping Use: Never used  Substance and Sexual Activity   Alcohol use: No   Drug use: No   Sexual activity: Not on file  Other Topics Concern   Not on file  Social History Narrative   Not on file   Social Determinants of Health   Financial Resource Strain: Not on file  Food Insecurity: Not on file  Transportation Needs: Not on file  Physical Activity: Not on file  Stress: Not on file  Social Connections: Not on file  Intimate Partner Violence: Not on file    Outpatient Medications Prior to Visit  Medication Sig Dispense Refill   Acetaminophen (TYLENOL PO) Take by mouth daily as needed.      albuterol (VENTOLIN HFA) 108 (90 Base) MCG/ACT inhaler Inhale 2 puffs into the lungs every 6 (six) hours as needed for wheezing or shortness of breath. 8 g 0   amLODipine (NORVASC) 10 MG tablet Take 1 tablet  (10 mg total) by mouth daily. 90 tablet 3   apixaban (ELIQUIS) 5 MG TABS tablet Take 1 tablet (5 mg total) by mouth 2 (two) times daily. 180 tablet 3   colchicine 0.6 MG tablet 2 tabs po on Day 1 of gout flare, then 1 tab QD until gout flare is over. 30 tablet 0   Docusate Calcium (STOOL SOFTENER PO) Take by mouth as needed. (Patient not taking: Reported on 08/18/2021)     Ferrous Sulfate (IRON SUPPLEMENT PO) Take 1 tablet by mouth daily.     gabapentin (NEURONTIN) 300 MG capsule Take 600 mg by mouth 2 (two) times daily.      Latanoprost (XELPROS) 0.005 % EMUL Apply 1 drop to eye at bedtime.     levothyroxine (EUTHYROX) 88 MCG tablet Take 1 tablet (88 mcg total) by mouth daily. 90 tablet 0   meclizine (ANTIVERT) 25 MG tablet Take 25 mg by mouth 3 (three) times daily as needed for dizziness.     niacin (NIASPAN) 500 MG CR tablet Take 500 mg by mouth daily.     nitroGLYCERIN (NITROSTAT) 0.4 MG SL tablet DISSOLVE ONE TABLET UNDER THE TONGUE EVERY 5 MINUTES AS NEEDED FOR CHEST PAIN (Patient not taking: Reported on 08/18/2021) 100 tablet prn   omeprazole (PRILOSEC) 40 MG capsule Take 1 capsule by mouth once daily 90 capsule 0   REFRESH CELLUVISC 1 % GEL Apply 1 drop to eye 3 (three) times daily.      telmisartan (MICARDIS) 20 MG tablet Take 1 tablet (20 mg total) by mouth daily. 90 tablet 3   timolol (TIMOPTIC) 0.5 % ophthalmic solution Place 1 drop into both eyes daily.      vitamin B-12 (CYANOCOBALAMIN) 1000 MCG tablet Take 1 tablet by mouth daily.     No facility-administered medications prior to visit.    Allergies  Allergen Reactions   Atorvastatin Other (See Comments)    REACTION: Muscle weakness   Nsaids Other (See Comments)    Avoid with current kidney function.     Colesevelam Other (See Comments)    REACTION: joint pain   Pravastatin Other (See Comments)    Muscle aches   Simvastatin Other (See Comments)    REACTION: Myalgias    ROS Review of Systems  Objective:     Physical Exam Constitutional:      Appearance: Normal appearance. He is well-developed.  HENT:     Head: Normocephalic and atraumatic.  Cardiovascular:     Rate and Rhythm: Normal rate and regular rhythm.     Heart sounds: Normal heart sounds.  Pulmonary:     Effort: Pulmonary effort is normal.     Breath sounds: Normal breath sounds.  Musculoskeletal:     Comments: Triggering of his middle finger on his right hand.  Skin:    General: Skin is warm and dry.  Neurological:     Mental Status: He is alert and oriented to person, place, and time. Mental status is at baseline.  Psychiatric:        Behavior: Behavior normal.    BP (!) 136/39   Pulse (!) 53   Ht 5\' 8"  (1.727 m)   Wt 162 lb (73.5 kg)   SpO2 98%   BMI 24.63 kg/m  Wt Readings from Last 3 Encounters:  09/03/21 162 lb (73.5 kg)  08/18/21 164 lb 1.3 oz (74.4 kg)  08/13/21 166 lb 1.9 oz (75.4 kg)     Health Maintenance Due  Topic Date Due   Zoster Vaccines- Shingrix (1 of 2) Never done   COVID-19 Vaccine (3 - Moderna risk series) 05/24/2020    There are no preventive care reminders to display for this patient.  Lab Results  Component Value Date   TSH 0.474 06/23/2021   Lab Results  Component Value Date   WBC 4.0 08/18/2021   HGB 8.2 (L) 08/18/2021   HCT 25.3 (L) 08/18/2021   MCV 106.8 (H) 08/18/2021   PLT 184 08/18/2021   Lab Results  Component Value Date   NA 140 08/18/2021   K 5.1 08/18/2021   CO2 25 08/18/2021   GLUCOSE 89 08/18/2021   BUN 33 (H) 08/18/2021   CREATININE 2.14 (H) 08/18/2021   BILITOT 0.4 08/18/2021   ALKPHOS 101 08/18/2021   AST 10 (L) 08/18/2021   ALT 7 08/18/2021   PROT 6.4 (L) 08/18/2021   ALBUMIN 4.1 08/18/2021   CALCIUM 9.7 08/18/2021   ANIONGAP 7 08/18/2021   GFR 36.14 (L) 08/19/2020   Lab Results  Component Value Date   CHOL 187 05/27/2020   Lab Results  Component Value Date   HDL 39 (L) 05/27/2020   Lab Results  Component Value Date   LDLCALC 123 (H)  05/27/2020   Lab Results  Component Value Date   TRIG 130 05/27/2020   Lab Results  Component Value Date   CHOLHDL 4.8 05/27/2020   Lab Results  Component Value Date   HGBA1C 5.5 08/26/2015      Assessment & Plan:   Problem List Items Addressed This Visit       Cardiovascular and Mediastinum   HYPERTENSION, BENIGN ESSENTIAL    Blood pressures okay today.  Diastolics a little bit on the low end but that is a typical trend for him.  Just encouraged him to make sure he is hydrating.  Recent labs are up to date though he is due for fasting lipid panel.      Relevant Orders   Lipid Panel w/reflex Direct LDL   Aortic atherosclerosis (HCC) - Primary   Relevant Orders   Lipid Panel w/reflex Direct LDL     Genitourinary   CKD (chronic kidney disease) stage 4, GFR 15-29 ml/min (HCC)    Keep follow-up with nephrology next month.  Recent renal function  was stable which is fantastic.        Other   Iron deficiency anemia due to chronic blood loss (Chronic)    Recently received an infusion has a follow-up in December with heme-onc.      Other Visit Diagnoses     Trigger middle finger of right hand       Need for immunization against influenza       Relevant Orders   Flu Vaccine QUAD High Dose(Fluad) (Completed)       Trigger finger-discussed diagnosis and recommended treatment.  I like for him to get in with one of our sports med docs to discuss possible treatment with an injection.  No orders of the defined types were placed in this encounter.   Follow-up: Return in about 6 months (around 03/04/2022).    Beatrice Lecher, MD

## 2021-09-03 NOTE — Assessment & Plan Note (Signed)
Blood pressures okay today.  Diastolics a little bit on the low end but that is a typical trend for him.  Just encouraged him to make sure he is hydrating.  Recent labs are up to date though he is due for fasting lipid panel.

## 2021-09-03 NOTE — Assessment & Plan Note (Signed)
Keep follow-up with nephrology next month.  Recent renal function was stable which is fantastic.

## 2021-09-03 NOTE — Assessment & Plan Note (Signed)
Recently received an infusion has a follow-up in December with heme-onc.

## 2021-09-04 LAB — LIPID PANEL W/REFLEX DIRECT LDL
Cholesterol: 176 mg/dL (ref <200)
HDL: 36 mg/dL — ABNORMAL LOW (ref >=40)
LDL Cholesterol (Calc): 119 mg/dL (calc) — ABNORMAL HIGH
Non-HDL Cholesterol (Calc): 140 mg/dL (calc) — ABNORMAL HIGH (ref <130)
Total CHOL/HDL Ratio: 4.9 (calc) (ref <5.0)
Triglycerides: 104 mg/dL (ref <150)

## 2021-09-05 NOTE — Progress Notes (Signed)
Call pt: cholesterol is still elevated but better than last time. I know he can't take statins.  Would he be will to try one of the new cholesterol drugs that is not a statin, like Praluent?

## 2021-09-09 ENCOUNTER — Other Ambulatory Visit: Payer: Self-pay | Admitting: Family Medicine

## 2021-09-09 DIAGNOSIS — E039 Hypothyroidism, unspecified: Secondary | ICD-10-CM

## 2021-09-12 ENCOUNTER — Telehealth: Payer: Self-pay | Admitting: *Deleted

## 2021-09-12 NOTE — Telephone Encounter (Signed)
Fax from pharmacy to change manufacture due to NTI. Will call and advise pt of this and have him to come back in for TSH check in 6 weeks.   LVM advising pt of change and to come in to have labs done in 6 wks to check thyroid on this medication.

## 2021-09-16 DIAGNOSIS — H527 Unspecified disorder of refraction: Secondary | ICD-10-CM | POA: Diagnosis not present

## 2021-09-16 DIAGNOSIS — H18513 Endothelial corneal dystrophy, bilateral: Secondary | ICD-10-CM | POA: Diagnosis not present

## 2021-09-16 DIAGNOSIS — H16223 Keratoconjunctivitis sicca, not specified as Sjogren's, bilateral: Secondary | ICD-10-CM | POA: Diagnosis not present

## 2021-09-16 DIAGNOSIS — H02836 Dermatochalasis of left eye, unspecified eyelid: Secondary | ICD-10-CM | POA: Diagnosis not present

## 2021-09-16 DIAGNOSIS — H02833 Dermatochalasis of right eye, unspecified eyelid: Secondary | ICD-10-CM | POA: Diagnosis not present

## 2021-09-16 DIAGNOSIS — Z961 Presence of intraocular lens: Secondary | ICD-10-CM | POA: Diagnosis not present

## 2021-09-16 DIAGNOSIS — H401133 Primary open-angle glaucoma, bilateral, severe stage: Secondary | ICD-10-CM | POA: Diagnosis not present

## 2021-09-22 DIAGNOSIS — N1831 Chronic kidney disease, stage 3a: Secondary | ICD-10-CM | POA: Diagnosis not present

## 2021-09-29 ENCOUNTER — Telehealth: Payer: Self-pay | Admitting: Cardiology

## 2021-09-29 ENCOUNTER — Ambulatory Visit: Payer: PPO | Admitting: Cardiology

## 2021-09-29 ENCOUNTER — Other Ambulatory Visit: Payer: Self-pay | Admitting: Family Medicine

## 2021-09-29 ENCOUNTER — Other Ambulatory Visit: Payer: Self-pay

## 2021-09-29 ENCOUNTER — Encounter: Payer: Self-pay | Admitting: Cardiology

## 2021-09-29 VITALS — BP 132/68 | HR 49 | Ht 68.0 in | Wt 167.8 lb

## 2021-09-29 DIAGNOSIS — I10 Essential (primary) hypertension: Secondary | ICD-10-CM | POA: Insufficient documentation

## 2021-09-29 DIAGNOSIS — I351 Nonrheumatic aortic (valve) insufficiency: Secondary | ICD-10-CM | POA: Diagnosis not present

## 2021-09-29 DIAGNOSIS — I25118 Atherosclerotic heart disease of native coronary artery with other forms of angina pectoris: Secondary | ICD-10-CM | POA: Diagnosis not present

## 2021-09-29 DIAGNOSIS — I480 Paroxysmal atrial fibrillation: Secondary | ICD-10-CM

## 2021-09-29 DIAGNOSIS — N184 Chronic kidney disease, stage 4 (severe): Secondary | ICD-10-CM | POA: Diagnosis not present

## 2021-09-29 MED ORDER — ISOSORBIDE MONONITRATE ER 30 MG PO TB24: 15.0000 mg | ORAL_TABLET | Freq: Every day | ORAL | 3 refills | Status: AC

## 2021-09-29 NOTE — Patient Instructions (Addendum)
Medication Instructions:  Your physician has recommended you make the following change in your medication:  START: Imdur 15 mg once daily *If you need a refill on your cardiac medications before your next appointment, please call your pharmacy*   Lab Work: Your physician recommends that you return for lab work in:  TOMORROW: BMET, Mag, CBC , Troponin  If you have labs (blood work) drawn today and your tests are completely normal, you will receive your results only by: MyChart Message (if you have MyChart) OR A paper copy in the mail If you have any lab test that is abnormal or we need to change your treatment, we will call you to review the results.   Testing/Procedures: None   Follow-Up: At [facility], you and your health needs are our priority.  As part of our continuing mission to provide you with exceptional heart care, we have created designated Provider Care Teams.  These Care Teams include your primary Cardiologist (physician) and Advanced Practice Providers (APPs -  Physician Assistants and Nurse Practitioners) who all work together to provide you with the care you need, when you need it.  We recommend signing up for the patient portal called "MyChart".  Sign up information is provided on this After Visit Summary.  MyChart is used to connect with patients for Virtual Visits (Telemedicine).  Patients are able to view lab/test results, encounter notes, upcoming appointments, etc.  Non-urgent messages can be sent to your provider as well.   To learn more about what you can do with MyChart, go to [email or web address].    Your next appointment:   3 month(s)  The format for your next appointment:   In Person  Provider:   Kirk Ruths, MD     Other Instructions

## 2021-09-29 NOTE — Telephone Encounter (Signed)
Pt c/o of Chest Pain: STAT if CP now or developed within 24 hours  1. Are you having CP right now? Only just barely. Patient said probably 1/10 now   2. Are you experiencing any other symptoms (ex. SOB, nausea, vomiting, sweating)? no  3. How long have you been experiencing CP? Started about 9:00 am. Pt would call it a 10/10  4. Is your CP continuous or coming and going? continuous  5. Have you taken Nitroglycerin? Yes. Patient took 93    Wife also took the patient's BP and HR  10:29 am: 96/67  10:43 am : 100/58 HR 48 10:53 am: 105/66 HR 53 11:13 am: 117/57 HR 48

## 2021-09-29 NOTE — Progress Notes (Signed)
Cardiology Office Note:    Date:  09/29/2021   ID:  Roberto Fields, DOB April 27, 1946, MRN [id number]  PCP:  [name], MD  Cardiologist:  Kirk Ruths, MD  Electrophysiologist:  None   Referring MD: [name], *   " I had chest pain this morning"  History of Present Illness:    Roberto Fields is a 75 y.o. male with a hx of coronary artery disease heart catheterization in 2013 showed obtuse marginal 99% with 70% PDA is status post PCI to the marginal, per records he had distal disease mostly does not amenable to revascularization, carotid artery with 1 to 39% bilateral stenosis, history of atrial fibrillation on anticoagulation, ischemic cardiomyopathy EF 52%, most recent stress test in 2017 showed mild inferior lateral ischemia which was deemed low risk and continuation of medical management.   The patient follows with Dr. [name] and was last seen by him in October 2022 at which time from a cardiovascular standpoint he appears to be stable.  He had no anginal symptoms.  He comes in today after calling the office to be seen for reported chest pain.  The patient says this morning as he was getting ready for his day he started experience midsternal chest pressure-like sensation.  His wife was with him in the room said that when this started she gave him some nitroglycerin which did not resolve the pain at first and he ended up getting 3 nitroglycerin.  The pain resolved.  During his encounter he denies any chest discomfort.  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 Dr Unk Lightning removed a larger polyp   CORONARY STENT PLACEMENT  04/2011   [facility], [facility]   ESOPHAGOGASTRODUODENOSCOPY     dr gibbs in [place] and 1989   TRANSURETHRAL RESECTION OF PROSTATE      Current Medications: Current Meds  Medication Sig   Acetaminophen (TYLENOL PO) Take by mouth daily as needed.    albuterol (VENTOLIN HFA) 108 (90 Base) MCG/ACT inhaler Inhale 2 puffs into the lungs every 6 (six) hours as needed for wheezing or shortness of breath.   amLODipine (NORVASC) 5 MG tablet Take 5 mg by mouth daily.   apixaban (ELIQUIS) 5 MG TABS tablet Take 1 tablet (5 mg  total) by mouth 2 (two) times daily.   Ferrous Sulfate (IRON SUPPLEMENT PO) Take 1 tablet by mouth daily.   furosemide (LASIX) 20 MG tablet Take 20 mg by mouth daily.   gabapentin (NEURONTIN) 300 MG capsule Take 600 mg by mouth 2 (two) times daily.    isosorbide mononitrate (IMDUR) 30 MG 24 hr tablet Take 0.5 tablets (15 mg total) by mouth daily.   Latanoprost (XELPROS) 0.005 % EMUL Apply 1 drop to eye at bedtime.   levothyroxine (SYNTHROID) 88 MCG tablet Take 1 tablet by mouth once daily   meclizine (ANTIVERT) 25 MG tablet Take 25 mg by mouth 3 (three) times daily as needed for dizziness.   niacin (NIASPAN) 500 MG CR tablet Take 500 mg by mouth daily.   nitroGLYCERIN (NITROSTAT) 0.4 MG SL tablet DISSOLVE ONE TABLET UNDER THE TONGUE EVERY 5 MINUTES AS NEEDED FOR CHEST PAIN    omeprazole (PRILOSEC) 40 MG capsule Take 1 capsule by mouth once daily   REFRESH CELLUVISC 1 % GEL Apply 1 drop to eye 3 (three) times daily.    telmisartan (MICARDIS) 20 MG tablet Take 1 tablet (20 mg total) by mouth daily.   timolol (TIMOPTIC) 0.5 % ophthalmic solution Place 1 drop into both eyes daily.    vitamin B-12 (CYANOCOBALAMIN) 1000 MCG tablet Take 1 tablet by mouth daily.     Allergies:   Atorvastatin, Nsaids, Colesevelam, Pravastatin, and Simvastatin   Social History   Socioeconomic History   Marital status: Married    Spouse name: Not on file   Number of children: 3   Years of education: Not on file   Highest education level: Not on file  Occupational History   Occupation: Retired  Tobacco Use   Smoking status: Former    Types: Cigarettes    Quit date: 10/02/1984    Years since quitting: 37.0   Smokeless tobacco: Never  Vaping Use   Vaping Use: Never used  Substance and Sexual Activity   Alcohol use: No   Drug use: No   Sexual activity: Not on file  Other Topics Concern   Not on file  Social History Narrative   Not on file   Social Determinants of Health   Financial Resource Strain: Not on file  Food Insecurity: Not on file  Transportation Needs: Not on file  Physical Activity: Not on file  Stress: Not on file  Social Connections: Not on file     Family History: The patient's family history includes Breast cancer in his sister and sister; Coronary artery disease in his brother and brother; Heart attack in his father; Heart attack (age of onset: 2) in his brother; Heart disease in his father and sister; Hypertension in an other family member; Leukemia in his mother; Lung cancer in his sister and sister; Stomach cancer in his sister. There is no history of Colon cancer, Esophageal cancer, or Rectal cancer.  ROS:   Review of Systems  Constitution: Negative for decreased appetite, fever and weight gain.  HENT: Negative for congestion, ear discharge,  hoarse voice and sore throat.   Eyes: Negative for discharge, redness, vision loss in right eye and visual halos.  Cardiovascular: Negative for chest pain, dyspnea on exertion, leg swelling, orthopnea and palpitations.  Respiratory: Negative for cough, hemoptysis, shortness of breath and snoring.   Endocrine: Negative for heat intolerance and polyphagia.  Hematologic/Lymphatic: Negative for bleeding problem. Does not bruise/bleed easily.  Skin: Negative for flushing, nail changes, rash and suspicious lesions.  Musculoskeletal:  Negative for arthritis, joint pain, muscle cramps, myalgias, neck pain and stiffness.  Gastrointestinal: Negative for abdominal pain, bowel incontinence, diarrhea and excessive appetite.  Genitourinary: Negative for decreased libido, genital sores and incomplete emptying.  Neurological: Negative for brief paralysis, focal weakness, headaches and loss of balance.  Psychiatric/Behavioral: Negative for altered mental status, depression and suicidal ideas.  Allergic/Immunologic: Negative for HIV exposure and persistent infections.    EKGs/Labs/Other Studies Reviewed:    The following studies were reviewed today:   EKG:  The ekg ordered today demonstrates sinus bradycardia, heart rate 49 bpm.  Echocardiogram Mar 19, 2021 IMPRESSIONS   1. Left ventricular ejection fraction, by estimation, is 55 to 60%. The  left ventricle has normal function. The left ventricle has no regional  wall motion abnormalities. Left ventricular diastolic parameters were  normal. The average left ventricular  global longitudinal strain is -20.0 %. The global longitudinal strain is  normal.   2. Right ventricular systolic function is normal. The right ventricular  size is normal.   3. Left atrial size was moderately dilated.   4. The mitral valve is normal in structure. Trivial mitral valve  regurgitation.   5. The aortic valve is tricuspid. Aortic valve regurgitation is moderate.  Aortic  regurgitation PHT measures 360 msec.   FINDINGS   Left Ventricle: Left ventricular ejection fraction, by estimation, is 55 to 60%. The left ventricle has normal function. The left ventricle has no regional wall motion abnormalities. The average left ventricular global longitudinal strain is -20.0 %.  The global longitudinal strain is normal. The left ventricular internal cavity size was normal in size. There is no left ventricular hypertrophy.  Left ventricular diastolic parameters were normal. Normal left ventricular filling pressure.  Right Ventricle: The right ventricular size is normal. No increase in right ventricular wall thickness. Right ventricular systolic function is normal.   Left Atrium: Left atrial size was moderately dilated.   Right Atrium: Right atrial size was normal in size.   Pericardium: There is no evidence of pericardial effusion.   Mitral Valve: The mitral valve is normal in structure. Trivial mitral  valve regurgitation.   Tricuspid Valve: The tricuspid valve is normal in structure. Tricuspid  valve regurgitation is trivial.   Aortic Valve: The aortic valve is tricuspid. Aortic valve regurgitation is  moderate. Aortic regurgitation PHT measures 360 msec. Aortic valve mean  gradient measures 5.6 mmHg. Aortic valve peak gradient measures 10.3 mmHg.  Aortic valve area, by VTI  measures 2.33 cm.   Pulmonic Valve: The pulmonic valve was grossly normal. Pulmonic valve  regurgitation is not visualized.   Aorta: The aortic root and ascending aorta are structurally normal, with  no evidence of dilitation.   IAS/Shunts: No atrial level shunt detected by color flow Doppler.   Recent Labs: 06/23/2021: TSH 0.474 08/18/2021: ALT 7; BUN 33; Creatinine 2.14; Hemoglobin 8.2; Platelet Count 184; Potassium 5.1; Sodium 140  Recent Lipid Panel    Component Value Date/Time   CHOL 176 09/03/2021 0000   TRIG 104 09/03/2021 0000   HDL 36 (L) 09/03/2021 0000   CHOLHDL 4.9  09/03/2021 0000   VLDL 35 (H) 07/27/2016 0959   LDLCALC 119 (H) 09/03/2021 0000    Physical Exam:    VS:  BP 132/68   Pulse (!) 49   Ht 5\' 8"  (1.727 m)   Wt 167 lb 12.8 oz (76.1 kg)   SpO2 99%   BMI 25.51 kg/m     Wt Readings from Last 3  Encounters:  09/29/21 167 lb 12.8 oz (76.1 kg)  09/03/21 162 lb (73.5 kg)  08/18/21 164 lb 1.3 oz (74.4 kg)     GEN: Well nourished, well developed in no acute distress HEENT: Normal NECK: No JVD; No carotid bruits LYMPHATICS: No lymphadenopathy CARDIAC: S1S2 noted,RRR, no murmurs, rubs, gallops RESPIRATORY:  Clear to auscultation without rales, wheezing or rhonchi  ABDOMEN: Soft, non-tender, non-distended, +bowel sounds, no guarding. EXTREMITIES: No edema, No cyanosis, no clubbing MUSCULOSKELETAL:  No deformity  SKIN: Warm and dry NEUROLOGIC:  Alert and oriented x 3, non-focal PSYCHIATRIC:  Normal affect, good insight  ASSESSMENT:    1. Coronary artery disease of native artery of native heart with stable angina pectoris ([place])   2. Primary hypertension   3. Paroxysmal atrial fibrillation (HCC)   4. CKD (chronic kidney disease) stage 4, GFR 15-29 ml/min (HCC)   5. Nonrheumatic aortic valve insufficiency    PLAN:    Thankfully he is not experiencing any chest pain in the office right now, we will continue at this point is optimize his medical therapy start low-dose antianginals with Imdur 15 mg daily.  At this time we will get blood work which will include reassessment of high-sensitivity troponin.  His blood pressure was manually taken by me which now appears to be within normal limits.  Paroxysmal atrial fibrillation-continue his anticoagulation the patient has had long standing bradycardia and is off beta-blocker for this reason.  His heart rate in the office today is 49 bpm.  According to the patient and his wife this is what he usually runs at home.   Hyperlipidemia-he has been intolerant to statins and Zetia.  He has declined  PSK 9 inhibitors.  Aortic regurgitation-appears to be euvolemic no signs of heart failure.  His echo is pending for May 2023.  No need for any urgent repeat.   The patient is in agreement with the above plan. The patient left the office in stable condition.  The patient will follow up Dr. [name].   Medication Adjustments/Labs and Tests Ordered: Current medicines are reviewed at length with the patient today.  Concerns regarding medicines are outlined above.  Orders Placed This Encounter  Procedures   Basic Metabolic Panel (BMET)   Magnesium   CBC with Differential/Platelet   Troponin T   EKG 12-Lead   Meds ordered this encounter  Medications   isosorbide mononitrate (IMDUR) 30 MG 24 hr tablet    Sig: Take 0.5 tablets (15 mg total) by mouth daily.    Dispense:  45 tablet    Refill:  3    Patient Instructions  Medication Instructions:  Your physician has recommended you make the following change in your medication:  START: Imdur 15 mg once daily *If you need a refill on your cardiac medications before your next appointment, please call your pharmacy*   Lab Work: Your physician recommends that you return for lab work in:  TOMORROW: BMET, Mag, CBC , Troponin  If you have labs (blood work) drawn today and your tests are completely normal, you will receive your results only by: MyChart Message (if you have MyChart) OR A paper copy in the mail If you have any lab test that is abnormal or we need to change your treatment, we will call you to review the results.   Testing/Procedures: None   Follow-Up: At [facility], you and your health needs are our priority.  As part of our continuing mission to provide you with exceptional heart care, we  have created designated Provider Care Teams.  These Care Teams include your primary Cardiologist (physician) and Advanced Practice Providers (APPs -  Physician Assistants and Nurse Practitioners) who all work together to provide you with  the care you need, when you need it.  We recommend signing up for the patient portal called "MyChart".  Sign up information is provided on this After Visit Summary.  MyChart is used to connect with patients for Virtual Visits (Telemedicine).  Patients are able to view lab/test results, encounter notes, upcoming appointments, etc.  Non-urgent messages can be sent to your provider as well.   To learn more about what you can do with MyChart, go to [email or web address].    Your next appointment:   3 month(s)  The format for your next appointment:   In Person  Provider:   Kirk Ruths, MD     Other Instructions     Adopting a Healthy Lifestyle.  Know what a healthy weight is for you (roughly BMI <25) and aim to maintain this   Aim for 7+ servings of fruits and vegetables daily   65-80+ fluid ounces of water or unsweet tea for healthy kidneys   Limit to max 1 drink of alcohol per day; avoid smoking/tobacco   Limit animal fats in diet for cholesterol and heart health - choose grass fed whenever available   Avoid highly processed foods, and foods high in saturated/trans fats   Aim for low stress - take time to unwind and care for your mental health   Aim for 150 min of moderate intensity exercise weekly for heart health, and weights twice weekly for bone health   Aim for 7-9 hours of sleep daily   When it comes to diets, agreement about the perfect plan isnt easy to find, even among the experts. Experts at the [place] developed an idea known as the Healthy Eating Plate. Just imagine a plate divided into logical, healthy portions.   The emphasis is on diet quality:   Load up on vegetables and fruits - one-half of your plate: Aim for color and variety, and remember that potatoes dont count.   Go for whole grains - one-quarter of your plate: Whole wheat, barley, wheat berries, quinoa, oats, brown rice, and foods made with them. If you want pasta, go  with whole wheat pasta.   Protein power - one-quarter of your plate: Fish, chicken, beans, and nuts are all healthy, versatile protein sources. Limit red meat.   The diet, however, does go beyond the plate, offering a few other suggestions.   Use healthy plant oils, such as olive, canola, soy, corn, sunflower and peanut. Check the labels, and avoid partially hydrogenated oil, which have unhealthy trans fats.   If youre thirsty, drink water. Coffee and tea are good in moderation, but skip sugary drinks and limit milk and dairy products to one or two daily servings.   The type of carbohydrate in the diet is more important than the amount. Some sources of carbohydrates, such as vegetables, fruits, whole grains, and beans-are healthier than others.   Finally, stay active  Signed, Berniece Salines, DO  09/29/2021 5:14 PM    [facility]

## 2021-09-29 NOTE — Telephone Encounter (Signed)
Spoke with wife and patient was getting dressed when he started having chest pains radiating to arm  Had to take 3 NTG every 15 min, with each one CP improved  Chest pain was at 0 when speaking with wife  Denies any change in breathing, nausea  or dizziness Dr Antionette Fairy nephrologist started Lasix 20 mg daily and decreased Amlodipine to 5 mg daily  No NTG in 1-2 months  Advised wife that normally we recommend calling 911 if 3 NTG needed but would discuss with DOD Spoke with Dr Harriet Masson and she can see patient today at 4:30, go to ED if pain returns  Advised wife, verbalized understanding

## 2021-09-30 ENCOUNTER — Telehealth: Payer: Self-pay

## 2021-09-30 DIAGNOSIS — I25118 Atherosclerotic heart disease of native coronary artery with other forms of angina pectoris: Secondary | ICD-10-CM | POA: Diagnosis not present

## 2021-09-30 NOTE — Telephone Encounter (Signed)
Called pt to see if he was able to get to the drawn. No answer at this time.

## 2021-09-30 NOTE — Telephone Encounter (Signed)
Called pt he states he just went to the doctor's office to have the blood work drawn.

## 2021-10-05 ENCOUNTER — Telehealth: Payer: Self-pay | Admitting: Cardiology

## 2021-10-05 NOTE — Telephone Encounter (Signed)
Pt's troponin T very mildly elevated at 0.01  normal <0.01   - I called pt but no answer and left message.   Will attempt to check pt in future.

## 2021-10-14 DIAGNOSIS — I25118 Atherosclerotic heart disease of native coronary artery with other forms of angina pectoris: Secondary | ICD-10-CM | POA: Diagnosis not present

## 2021-10-20 ENCOUNTER — Inpatient Hospital Stay: Payer: PPO | Admitting: Hematology & Oncology

## 2021-10-20 ENCOUNTER — Ambulatory Visit: Payer: PPO

## 2021-10-20 ENCOUNTER — Encounter: Payer: Self-pay | Admitting: Hematology & Oncology

## 2021-10-20 ENCOUNTER — Inpatient Hospital Stay: Payer: PPO | Attending: Hematology & Oncology

## 2021-10-20 ENCOUNTER — Other Ambulatory Visit: Payer: Self-pay

## 2021-10-20 VITALS — BP 132/41 | HR 58 | Temp 97.8°F | Resp 17 | Wt 170.1 lb

## 2021-10-20 DIAGNOSIS — I480 Paroxysmal atrial fibrillation: Secondary | ICD-10-CM | POA: Insufficient documentation

## 2021-10-20 DIAGNOSIS — Z7901 Long term (current) use of anticoagulants: Secondary | ICD-10-CM | POA: Insufficient documentation

## 2021-10-20 DIAGNOSIS — D631 Anemia in chronic kidney disease: Secondary | ICD-10-CM | POA: Diagnosis not present

## 2021-10-20 DIAGNOSIS — D500 Iron deficiency anemia secondary to blood loss (chronic): Secondary | ICD-10-CM

## 2021-10-20 DIAGNOSIS — Z79899 Other long term (current) drug therapy: Secondary | ICD-10-CM | POA: Diagnosis not present

## 2021-10-20 DIAGNOSIS — N289 Disorder of kidney and ureter, unspecified: Secondary | ICD-10-CM | POA: Diagnosis not present

## 2021-10-20 DIAGNOSIS — Z85038 Personal history of other malignant neoplasm of large intestine: Secondary | ICD-10-CM | POA: Insufficient documentation

## 2021-10-20 LAB — CBC WITH DIFFERENTIAL (CANCER CENTER ONLY)
Abs Immature Granulocytes: 0.02 10*3/uL (ref 0.00–0.07)
Basophils Absolute: 0 10*3/uL (ref 0.0–0.1)
Basophils Relative: 0 %
Eosinophils Absolute: 0.1 10*3/uL (ref 0.0–0.5)
Eosinophils Relative: 2 %
HCT: 29.2 % — ABNORMAL LOW (ref 39.0–52.0)
Hemoglobin: 9.3 g/dL — ABNORMAL LOW (ref 13.0–17.0)
Immature Granulocytes: 1 %
Lymphocytes Relative: 26 %
Lymphs Abs: 0.9 10*3/uL (ref 0.7–4.0)
MCH: 34.4 pg — ABNORMAL HIGH (ref 26.0–34.0)
MCHC: 31.8 g/dL (ref 30.0–36.0)
MCV: 108.1 fL — ABNORMAL HIGH (ref 80.0–100.0)
Monocytes Absolute: 0.3 10*3/uL (ref 0.1–1.0)
Monocytes Relative: 8 %
Neutro Abs: 2.3 10*3/uL (ref 1.7–7.7)
Neutrophils Relative %: 63 %
Platelet Count: 150 10*3/uL (ref 150–400)
RBC: 2.7 MIL/uL — ABNORMAL LOW (ref 4.22–5.81)
RDW: 13.8 % (ref 11.5–15.5)
WBC Count: 3.5 10*3/uL — ABNORMAL LOW (ref 4.0–10.5)
nRBC: 0 % (ref 0.0–0.2)

## 2021-10-20 LAB — IRON AND TIBC
Iron: 87 ug/dL (ref 42–163)
Saturation Ratios: 26 % (ref 20–55)
TIBC: 339 ug/dL (ref 202–409)
UIBC: 252 ug/dL (ref 117–376)

## 2021-10-20 LAB — CMP (CANCER CENTER ONLY)
ALT: 10 U/L (ref 0–44)
AST: 10 U/L — ABNORMAL LOW (ref 15–41)
Albumin: 4.2 g/dL (ref 3.5–5.0)
Alkaline Phosphatase: 110 U/L (ref 38–126)
Anion gap: 7 (ref 5–15)
BUN: 32 mg/dL — ABNORMAL HIGH (ref 8–23)
CO2: 27 mmol/L (ref 22–32)
Calcium: 9.5 mg/dL (ref 8.9–10.3)
Chloride: 109 mmol/L (ref 98–111)
Creatinine: 2.23 mg/dL — ABNORMAL HIGH (ref 0.61–1.24)
GFR, Estimated: 30 mL/min — ABNORMAL LOW (ref >60)
Glucose, Bld: 98 mg/dL (ref 70–99)
Potassium: 4.9 mmol/L (ref 3.5–5.1)
Sodium: 143 mmol/L (ref 135–145)
Total Bilirubin: 0.4 mg/dL (ref 0.3–1.2)
Total Protein: 6.3 g/dL — ABNORMAL LOW (ref 6.5–8.1)

## 2021-10-20 LAB — RETICULOCYTES
Immature Retic Fract: 10.5 % (ref 2.3–15.9)
RBC.: 2.67 MIL/uL — ABNORMAL LOW (ref 4.22–5.81)
Retic Count, Absolute: 75.3 10*3/uL (ref 19.0–186.0)
Retic Ct Pct: 2.8 % (ref 0.4–3.1)

## 2021-10-20 LAB — FERRITIN: Ferritin: 37 ng/mL (ref 24–336)

## 2021-10-20 NOTE — Progress Notes (Signed)
Hematology and Oncology Follow Up Visit  Roberto Fields [id number] 1946-04-19 75 y.o. 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 is here today with his wife for follow-up.  So far, he has been doing pretty well.  We last saw him 2 months ago.  He and his wife had a nice and quiet Thanksgiving.  He is on diuretic right now.  He has had some leg swelling according to his wife.  The kidney doctor put him on the diuretic.  He has had no issues with his appetite.  He has had no problems with nausea or vomiting.  He has had no issues with COVID.  Is last iron studies back in October showed a ferritin of 73 with iron saturation of 28%.  He has had no chest pain.  He has had no fatigue.  He has had no rashes.  Overall, his performance status is ECOG 1.   Medications:  Allergies as of 10/20/2021       Reactions   Atorvastatin Other (See Comments)   REACTION: Muscle weakness   Nsaids Other (See Comments)   Avoid with current kidney function.     Colesevelam Other (See Comments)   REACTION: joint pain   Pravastatin Other (See Comments)   Muscle aches   Simvastatin Other (See Comments)   REACTION: Myalgias        Medication List        Accurate as of October 20, 2021  9:35 AM. If you have any questions, ask your nurse or doctor.          albuterol 108 (90 Base) MCG/ACT inhaler Commonly known as: VENTOLIN HFA Inhale 2 puffs into the lungs every 6 (six) hours as needed for wheezing or shortness of breath.   amLODipine 10 MG tablet Commonly known as: NORVASC Take 1  tablet (10 mg total) by mouth daily.   amLODipine 5 MG tablet Commonly known as: NORVASC Take 5 mg by mouth daily.   apixaban 5 MG Tabs tablet Commonly known as: Eliquis Take 1 tablet (5 mg total) by mouth 2 (two) times daily.   colchicine 0.6 MG tablet 2 tabs po on Day 1 of gout flare, then 1 tab QD until gout flare is over.   furosemide 20 MG tablet Commonly known as: LASIX Take 20 mg by mouth daily.   gabapentin 300 MG capsule Commonly known as: NEURONTIN Take 600 mg by mouth 2 (two) times daily.   IRON SUPPLEMENT PO Take 1 tablet by mouth daily.   isosorbide mononitrate 30 MG 24 hr tablet Commonly known as: IMDUR Take 0.5 tablets (15 mg total) by mouth daily.   levothyroxine 88 MCG tablet Commonly known as: SYNTHROID Take 1 tablet by mouth once daily   meclizine 25 MG tablet Commonly known as: ANTIVERT Take 25 mg by mouth 3 (three) times daily as needed for dizziness.   niacin 500 MG CR tablet Commonly known as: NIASPAN Take 500 mg by mouth daily.   nitroGLYCERIN 0.4 MG SL tablet Commonly known as: NITROSTAT DISSOLVE 1 TABLET UNDER THE TONGUE EVERY 5 MINUTES AS  NEEDED FOR CHEST PAIN   omeprazole 40 MG capsule Commonly known as: PRILOSEC Take 1 capsule by mouth once daily   Refresh Celluvisc 1 % Gel Generic drug: Carboxymethylcellulose Sod PF Apply 1 drop to eye 3 (three) times daily.   telmisartan 20 MG tablet Commonly known as: MICARDIS Take 1 tablet (20 mg total) by mouth daily.   timolol 0.5 % ophthalmic solution Commonly known as: TIMOPTIC Place 1 drop into both eyes daily.   TYLENOL PO Take by mouth daily as needed.   vitamin B-12 1000 MCG tablet Commonly known as: CYANOCOBALAMIN Take 1 tablet by mouth daily.   Xelpros 0.005 % Emul Generic drug: Latanoprost Apply 1 drop to eye at bedtime.        Allergies:  Allergies  Allergen Reactions   Atorvastatin Other (See Comments)    REACTION: Muscle weakness   Nsaids Other (See  Comments)    Avoid with current kidney function.     Colesevelam Other (See Comments)    REACTION: joint pain   Pravastatin Other (See Comments)    Muscle aches   Simvastatin Other (See Comments)    REACTION: Myalgias    Past Medical History, Surgical history, Social history, and Family History were reviewed and updated.  Review of Systems: Review of Systems  Constitutional:  Positive for malaise/fatigue.  HENT: Negative.    Eyes: Negative.   Respiratory:  Positive for shortness of breath.   Cardiovascular:  Positive for palpitations and leg swelling.  Gastrointestinal:  Positive for nausea.  Genitourinary: Negative.   Musculoskeletal:  Positive for joint pain and myalgias.  Skin: Negative.   Neurological: Negative.   Endo/Heme/Allergies: Negative.   Psychiatric/Behavioral: Negative.      Physical Exam:  weight is 170 lb 1.9 oz (77.2 kg). His oral temperature is 97.8 F (36.6 C). His blood pressure is 132/41 (abnormal) and his pulse is 58 (abnormal). His respiration is 17 and oxygen saturation is 98%.   Wt Readings from Last 3 Encounters:  10/20/21 170 lb 1.9 oz (77.2 kg)  09/29/21 167 lb 12.8 oz (76.1 kg)  09/03/21 162 lb (73.5 kg)    Physical Exam Vitals reviewed.  HENT:     Head: Normocephalic and atraumatic.  Eyes:     Pupils: Pupils are equal, round, and reactive to light.  Cardiovascular:     Rate and Rhythm: Normal rate and regular rhythm.     Heart sounds: Normal heart sounds.  Pulmonary:     Effort: Pulmonary effort is normal.     Breath sounds: Normal breath sounds.  Abdominal:     General: Bowel sounds are normal.     Palpations: Abdomen is soft.  Musculoskeletal:        General: No tenderness or deformity. Normal range of motion.     Cervical back: Normal range of motion.  Lymphadenopathy:     Cervical: No cervical adenopathy.  Skin:    General: Skin is warm and dry.     Findings: No erythema or rash.  Neurological:     Mental Status: He is  alert and oriented to person, place, and time.  Psychiatric:        Behavior: Behavior normal.        Thought Content: Thought content normal.        Judgment: Judgment normal.     Lab Results  Component Value Date   WBC 3.5 (L) 10/20/2021   HGB 9.3 (L) 10/20/2021   HCT 29.2 (L) 10/20/2021   MCV  108.1 (H) 10/20/2021   PLT 150 10/20/2021   Lab Results  Component Value Date   FERRITIN 73 08/18/2021   IRON 84 08/18/2021   TIBC 299 08/18/2021   UIBC 215 08/18/2021   IRONPCTSAT 28 08/18/2021   Lab Results  Component Value Date   RETICCTPCT 2.8 10/20/2021   RBC 2.70 (L) 10/20/2021   RBC 2.67 (L) 10/20/2021   Lab Results  Component Value Date   KPAFRELGTCHN 64.7 (H) 06/27/2020   LAMBDASER 46.3 (H) 06/27/2020   KAPLAMBRATIO 1.40 06/27/2020   Lab Results  Component Value Date   IGGSERUM 649 06/27/2020   IGA 223 06/27/2020   IGMSERUM 140 06/27/2020   Lab Results  Component Value Date   TOTALPROTELP 6.3 06/27/2020     Chemistry      Component Value Date/Time   NA 143 10/20/2021 0847   NA 142 01/05/2017 0000   K 4.9 10/20/2021 0847   CL 109 10/20/2021 0847   CO2 27 10/20/2021 0847   BUN 32 (H) 10/20/2021 0847   BUN 24 (A) 01/05/2017 0000   CREATININE 2.23 (H) 10/20/2021 0847   CREATININE 1.66 (H) 09/03/2020 1143   GLU 95 01/05/2017 0000      Component Value Date/Time   CALCIUM 9.5 10/20/2021 0847   CALCIUM 9.5 09/19/2014 0000   ALKPHOS 110 10/20/2021 0847   AST 10 (L) 10/20/2021 0847   ALT 10 10/20/2021 0847   BILITOT 0.4 10/20/2021 0847       Impression and Plan: Mr. Worlds is a very pleasant 75 yo caucasian gentleman with multifactorial anemia as well as history of colon cancer treated with resection in November 2020.  We will hold on Aranesp.  I think that we can get him through the winter.  I know is hard for him to get here.  I just want to make things easier for him.  His blood count is better.  We will see what his iron levels look like.   [name], MD 12/12/20229:35 AM

## 2021-10-24 ENCOUNTER — Other Ambulatory Visit: Payer: Self-pay | Admitting: Family Medicine

## 2021-11-17 ENCOUNTER — Ambulatory Visit (INDEPENDENT_AMBULATORY_CARE_PROVIDER_SITE_OTHER): Payer: PPO | Admitting: Family Medicine

## 2021-11-17 DIAGNOSIS — Z0000 Encounter for general adult medical examination without abnormal findings: Secondary | ICD-10-CM

## 2021-11-17 NOTE — Progress Notes (Signed)
MEDICARE ANNUAL WELLNESS VISIT  11/17/2021  Telephone Visit Disclaimer This Medicare AWV was conducted by telephone due to national recommendations for restrictions regarding the COVID-19 Pandemic (e.g. social distancing).  I verified, using two identifiers, that I am speaking with Roberto Fields or their authorized healthcare agent. I discussed the limitations, risks, security, and privacy concerns of performing an evaluation and management service by telephone and the potential availability of an in-person appointment in the future. The patient expressed understanding and agreed to proceed.  Location of Patient: Home Location of Provider (nurse):  In the office.  Subjective:    Roberto Fields is a 76 y.o. male patient of Metheney, Rene Kocher, MD who had a Medicare Annual Wellness Visit today via telephone. Roberto Fields is Retired and lives with their spouse. he had 3 children but one has deceased . he reports that he is socially active and does interact with friends/family regularly. he is minimally physically active and enjoys watching television.  Patient Care Team: [name], MD as PCP - General [name] [name], MD as PCP - Cardiology (Cardiology) [name], MD as Referring Physician (Cardiology) [name], MD as Referring Physician (Nephrology) [name], MD (Inactive) as Consulting Physician (Hematology) Karl Luke, MD as Referring Physician (Neurology) Darius Bump, [facility] as Pharmacist (Pharmacist)  Advanced Directives 11/17/2021 10/20/2021 08/18/2021 06/23/2021 03/24/2021 01/20/2021 10/28/2020  Does Patient Have a Medical Advance Directive? No No No No No No No  Would patient like information on creating a medical advance directive? No - Patient declined No - Patient declined No - Patient declined - No - Patient declined No - Patient declined No - Patient declined    Hospital Utilization Over the Past 12 Months: # of hospitalizations or ER  visits: 0 # of surgeries: 0  Review of Systems    Patient reports that his overall health is unchanged compared to last year.  History obtained from chart review and his wife. Patient has some hearing loss.  Patient Reported Readings (BP, Pulse, CBG, Weight, etc) none  Pain Assessment Pain : No/denies pain     Current Medications & Allergies (verified) Allergies as of 11/17/2021       Reactions   Atorvastatin Other (See Comments)   REACTION: Muscle weakness   Nsaids Other (See Comments)   Avoid with current kidney function.     Colesevelam Other (See Comments)   REACTION: joint pain   Pravastatin Other (See Comments)   Muscle aches   Simvastatin Other (See Comments)   REACTION: Myalgias        Medication List        Accurate as of November 17, 2021 11:03 AM. If you have any questions, ask your nurse or doctor.          albuterol 108 (90 Base) MCG/ACT inhaler Commonly known as: VENTOLIN HFA Inhale 2 puffs into the lungs every 6 (six) hours as needed for wheezing or shortness of breath.   amLODipine 10 MG tablet Commonly known as: NORVASC Take 1 tablet (10 mg total) by mouth daily.   amLODipine 5 MG tablet Commonly known as: NORVASC Take 5 mg by mouth daily.   apixaban 5 MG Tabs tablet Commonly known as: Eliquis Take 1 tablet (5 mg total) by mouth 2 (two) times daily.   colchicine 0.6 MG tablet 2 tabs po on Day 1 of gout flare, then 1 tab QD until gout flare is over.   furosemide 20 MG tablet Commonly known  as: LASIX Take 20 mg by mouth daily.   gabapentin 300 MG capsule Commonly known as: NEURONTIN Take 600 mg by mouth 2 (two) times daily.   IRON SUPPLEMENT PO Take 1 tablet by mouth daily.   isosorbide mononitrate 30 MG 24 hr tablet Commonly known as: IMDUR Take 0.5 tablets (15 mg total) by mouth daily.   levothyroxine 88 MCG tablet Commonly known as: SYNTHROID Take 1 tablet by mouth once daily   meclizine 25 MG tablet Commonly known as:  ANTIVERT TAKE 1 TABLET BY MOUTH THREE TIMES DAILY AS NEEDED FOR DIZZINESS   niacin 500 MG CR tablet Commonly known as: NIASPAN Take 500 mg by mouth daily.   nitroGLYCERIN 0.4 MG SL tablet Commonly known as: NITROSTAT DISSOLVE 1 TABLET UNDER THE TONGUE EVERY 5 MINUTES AS NEEDED FOR CHEST PAIN   omeprazole 40 MG capsule Commonly known as: PRILOSEC Take 1 capsule by mouth once daily   Refresh Celluvisc 1 % Gel Generic drug: Carboxymethylcellulose Sod PF Apply 1 drop to eye 3 (three) times daily.   telmisartan 20 MG tablet Commonly known as: MICARDIS Take 1 tablet (20 mg total) by mouth daily.   timolol 0.5 % ophthalmic solution Commonly known as: TIMOPTIC Place 1 drop into both eyes daily.   TYLENOL PO Take by mouth daily as needed.   vitamin B-12 1000 MCG tablet Commonly known as: CYANOCOBALAMIN Take 1 tablet by mouth daily.   Xelpros 0.005 % Emul Generic drug: Latanoprost Apply 1 drop to eye at bedtime.        History (reviewed): Past Medical History:  Diagnosis Date   Arthritis    Atrial fibrillation ([place])    Blood transfusion without reported diagnosis    BPH (benign prostatic hyperplasia)    CAD (coronary artery disease)    Cancer of sigmoid colon ([place]) 06/27/2020   Cataract    bilateral cataracts removed   COPD (chronic obstructive pulmonary disease) ([facility])    CVA (cerebral infarction)    Dry eye    Erythropoietin deficiency anemia 06/27/2020   Glaucoma    History of blood clots    History of colon polyps    Hyperlipidemia    Hypertension    Hypothyroid    Iron deficiency anemia due to chronic blood loss 06/27/2020   Myocardial infarction [facility]) 2010   Renal insufficiency    stage 4    Stomach ulcer    Stroke ([place]) 2010   Tuberculosis    at age 76   Ulcer 1992   HX peptic ulcer- DX: by endoscopy   Past Surgical History:  Procedure Laterality Date   arm surgery     COLONOSCOPY     Dr Lavina Hamman in [place]. Dr Unk Lightning removed a larger polyp    CORONARY STENT PLACEMENT  04/2011   [facility], [facility]   ESOPHAGOGASTRODUODENOSCOPY     dr gibbs in [place] and 1989   TRANSURETHRAL RESECTION OF PROSTATE     Family History  Problem Relation Age of Onset   Heart disease Father    Heart attack Father    Coronary artery disease Brother    Heart attack Brother 61       pacemaker and defibrillator   Coronary artery disease Brother    Leukemia Mother    Hypertension Other        family history of   Heart disease  Sister    Stomach cancer Sister    Breast cancer Sister    Lung cancer Sister    Breast cancer Sister    Lung cancer Sister    Colon cancer Neg Hx    Esophageal cancer Neg Hx    Rectal cancer Neg Hx    Social History   Socioeconomic History   Marital status: Married    Spouse name: Drexel Iha   Number of children: 3   Years of education: 14   Highest education level: Associate degree: academic program  Occupational History   Occupation: Retired  Tobacco Use   Smoking status: Former    Types: Cigarettes    Quit date: 10/02/1984    Years since quitting: 37.1   Smokeless tobacco: Never  Vaping Use   Vaping Use: Never used  Substance and Sexual Activity   Alcohol use: No   Drug use: No   Sexual activity: Not on file  Other Topics Concern   Not on file  Social History Narrative   Lives with his wife. He had three children but one has deceased. He enjoys watching televison.   Social Determinants of Health   Financial Resource Strain: Low Risk    Difficulty of Paying Living Expenses: Not hard at all  Food Insecurity: No Food Insecurity   Worried About Charity fundraiser in the Last Year: Never true   [place] in the Last Year: Never true  Transportation Needs: No Transportation Needs   Lack of Transportation (Medical): No   Lack of Transportation (Non-Medical): No  Physical Activity: Inactive   Days of Exercise  per Week: 0 days   Minutes of Exercise per Session: 0 min  Stress: No Stress Concern Present   Feeling of Stress : Not at all  Social Connections: Moderately Integrated   Frequency of Communication with Friends and Family: Once a week   Frequency of Social Gatherings with Friends and Family: Never   Attends Religious Services: More than 4 times per year   Active Member of [employer] or Organizations: Yes   Attends Music therapist: More than 4 times per year   Marital Status: Married    Activities of Daily Living In your present state of health, do you have any difficulty performing the following activities: 11/17/2021  Hearing? Y  Comment he has some hearing loss.  Vision? Y  Comment he can't see that well.  Difficulty concentrating or making decisions? Y  Comment some hearing loss.  Walking or climbing stairs? N  Dressing or bathing? N  Doing errands, shopping? Y  Comment he doesn't drive; his daughter helps with the appointments.  Preparing Food and eating ? Y  Comment his wife helps prepare food but he can feed himself.  Using the Toilet? N  In the past six months, have you accidently leaked urine? N  Do you have problems with loss of bowel control? N  Managing your Medications? Y  Comment wife helps him with the medications  Managing your Finances? N  Housekeeping or managing your Housekeeping? Y  Comment wife helps with that.  Some recent data might be hidden    Patient Education/ Literacy How often do you need to have someone help you when you read instructions, pamphlets, or other written materials from your doctor or pharmacy?: 1 - Never What is the last grade level you completed in school?: Associates degree in drafting.  Exercise Current Exercise Habits: The patient does not participate in  regular exercise at present, Exercise limited by: None identified  Diet Patient reports consuming 3 meals a day and 1 snack(s) a day Patient reports that his primary  diet is: Regular Patient reports that she does have regular access to food.   Depression Screen PHQ 2/9 Scores 11/17/2021 09/03/2021 09/03/2020 07/25/2019 12/19/2018 06/20/2018 08/02/2017  PHQ - 2 Score 0 0 0 0 0 0 0     Fall Risk Fall Risk  11/17/2021 09/03/2020 07/25/2019 06/20/2018 08/02/2017  Falls in the past year? 0 0 0 No No  Number falls in past yr: 0 - 0 - -  Injury with Fall? 0 - 0 - -  Risk for fall due to : No Fall Risks No Fall Risks - - -  Follow up Falls evaluation completed;Education provided - - - -     Objective:  Roberto Fields seemed alert and oriented and he participated appropriately during our telephone visit.  Blood Pressure Weight BMI  BP Readings from Last 3 Encounters:  10/20/21 (!) 132/41  09/29/21 132/68  09/03/21 (!) 136/39   Wt Readings from Last 3 Encounters:  10/20/21 170 lb 1.9 oz (77.2 kg)  09/29/21 167 lb 12.8 oz (76.1 kg)  09/03/21 162 lb (73.5 kg)   BMI Readings from Last 1 Encounters:  10/20/21 25.87 kg/m    *Unable to obtain current vital signs, weight, and BMI due to telephone visit type  Hearing/Vision  Jeneen Rinks did not seem to have difficulty with hearing/understanding during the telephone conversation Reports that he has had a formal eye exam by an eye care professional within the past year Reports that he has not had a formal hearing evaluation within the past year *Unable to fully assess hearing and vision during telephone visit type  Cognitive Function: 6CIT Screen 11/17/2021  What Year? 0 points  What month? 0 points  What time? 0 points  Count back from 20 4 points  Months in reverse 4 points  Repeat phrase 2 points  Total Score 10   (Normal:0-7, Significant for Dysfunction: >8)  Normal Cognitive Function Screening: No. His score was a 10. MD notified.   Immunization & Health Maintenance Record Immunization History  Administered Date(s) Administered   Fluad Quad(high Dose 65+) 07/25/2019, 09/03/2020, 09/03/2021   Influenza  Split 08/10/2012   Influenza Whole 08/21/2009, 08/11/2010, 07/14/2011, 08/03/2013   Influenza, High Dose Seasonal PF 08/02/2017, 08/15/2018   Influenza,inj,Quad PF,6+ Mos 07/25/2014, 07/29/2015   Influenza-Unspecified 09/19/2016   Moderna Covid-19 Vaccine Bivalent Booster 70yrs & up 06/18/2021   Moderna Sars-Covid-2 Vaccination 03/20/2020, 04/26/2020   Pneumococcal Conjugate-13 04/22/2015   Pneumococcal Polysaccharide-23 07/16/2011   Td 11/10/2003   Tdap 02/14/2014   Zoster, Live 05/09/2012    Health Maintenance  Topic Date Due   COVID-19 Vaccine (3 - Moderna risk series) 12/03/2021 (Originally 06/18/2021)   Zoster Vaccines- Shingrix (1 of 2) 02/15/2022 (Originally 03/12/1965)   COLONOSCOPY (Pts 45-71yrs Insurance coverage will need to be confirmed)  08/20/2023   TETANUS/TDAP  02/15/2024   Pneumonia Vaccine 49+ Years old  Completed   INFLUENZA VACCINE  Completed   Hepatitis C Screening  Completed   HPV VACCINES  Aged Out       Assessment  This is a routine wellness examination for Roberto Fields.  Health Maintenance: Due or Overdue There are no preventive care reminders to display for this patient.   Roberto Fields does not need a referral for Community Assistance: Care Management:   no Social Work:  no Prescription Assistance:  no Nutrition/Diabetes Education:  no   Plan:  Personalized Goals  Goals Addressed               This Visit's Progress     Patient Stated (pt-stated)        11/17/2021 AWV Goal: Exercise for General Health  Patient will verbalize understanding of the benefits of increased physical activity: Exercising regularly is important. It will improve your overall fitness, flexibility, and endurance. Regular exercise also will improve your overall health. It can help you control your weight, reduce stress, and improve your bone density. Over the next year, patient will increase physical activity as tolerated with a goal of at least 150 minutes of  moderate physical activity per week.  You can tell that you are exercising at a moderate intensity if your heart starts beating faster and you start breathing faster but can still hold a conversation. Moderate-intensity exercise ideas include: Walking 1 mile (1.6 km) in about 15 minutes Biking Hiking Golfing Dancing Water aerobics Patient will verbalize understanding of everyday activities that increase physical activity by providing examples like the following: Yard work, such as: Sales promotion account executive Gardening Washing windows or floors Patient will be able to explain general safety guidelines for exercising:  Before you start a new exercise program, talk with your health care provider. Do not exercise so much that you hurt yourself, feel dizzy, or get very short of breath. Wear comfortable clothes and wear shoes with good support. Drink plenty of water while you exercise to prevent dehydration or heat stroke. Work out until your breathing and your heartbeat get faster.        Personalized Health Maintenance & Screening Recommendations  Shingrix vaccine  Lung Cancer Screening Recommended: no (Low Dose CT Chest recommended if Age 37-80 years, 30 pack-year currently smoking OR have quit w/in past 15 years) Hepatitis C Screening recommended: no HIV Screening recommended: no  Advanced Directives: Written information was not prepared per patient's request.  Referrals & Orders No orders of the defined types were placed in this encounter.   Follow-up Plan Follow-up with [name], MD as planned Schedule your shingles vaccine at the pharmacy.  Follow up on memory test (6 CIT) at next in office visit. Patient did not want to schedule his next year's medicare wellness visit at this time.  Please schedule medicare wellness visit in one year. AVS printed and mailed to the patient.   I have  personally reviewed and noted the following in the patients chart:   Medical and social history Use of alcohol, tobacco or illicit drugs  Current medications and supplements Functional ability and status Nutritional status Physical activity Advanced directives List of other physicians Hospitalizations, surgeries, and ER visits in previous 12 months Vitals Screenings to include cognitive, depression, and falls Referrals and appointments  In addition, I have reviewed and discussed with Roberto Fields certain preventive protocols, quality metrics, and best practice recommendations. A written personalized care plan for preventive services as well as general preventive health recommendations is available and can be mailed to the patient at his request.      Tinnie Gens, RN  11/17/2021

## 2021-11-17 NOTE — Patient Instructions (Addendum)
[facility] Maintenance Summary and Written Plan of Care  [name] ,  Thank you for allowing me to perform your Medicare Annual Wellness Visit and for your ongoing commitment to your health.   Health Maintenance & Immunization History Health Maintenance  Topic Date Due   COVID-19 Vaccine (3 - Moderna risk series) 12/03/2021 (Originally 06/18/2021)   Zoster Vaccines- Shingrix (1 of 2) 02/15/2022 (Originally 03/12/1965)   COLONOSCOPY (Pts 45-33yrs Insurance coverage will need to be confirmed)  08/20/2023   TETANUS/TDAP  02/15/2024   Pneumonia Vaccine 28+ Years old  Completed   INFLUENZA VACCINE  Completed   Hepatitis C Screening  Completed   HPV VACCINES  Aged Out   Immunization History  Administered Date(s) Administered   Fluad Quad(high Dose 65+) 07/25/2019, 09/03/2020, 09/03/2021   Influenza Split 08/10/2012   Influenza Whole 08/21/2009, 08/11/2010, 07/14/2011, 08/03/2013   Influenza, High Dose Seasonal PF 08/02/2017, 08/15/2018   Influenza,inj,Quad PF,6+ Mos 07/25/2014, 07/29/2015   Influenza-Unspecified 09/19/2016   Moderna Covid-19 Vaccine Bivalent Booster 68yrs & up 06/18/2021   Moderna Sars-Covid-2 Vaccination 03/20/2020, 04/26/2020   Pneumococcal Conjugate-13 04/22/2015   Pneumococcal Polysaccharide-23 07/16/2011   Td 11/10/2003   Tdap 02/14/2014   Zoster, Live 05/09/2012    These are the patient goals that we discussed:  Goals Addressed               This Visit's Progress     Patient Stated (pt-stated)        11/17/2021 AWV Goal: Exercise for General Health  Patient will verbalize understanding of the benefits of increased physical activity: Exercising regularly is important. It will improve your overall fitness, flexibility, and endurance. Regular exercise also will improve your overall health. It can help you control your weight, reduce stress, and improve your bone density. Over the next year, patient will increase physical  activity as tolerated with a goal of at least 150 minutes of moderate physical activity per week.  You can tell that you are exercising at a moderate intensity if your heart starts beating faster and you start breathing faster but can still hold a conversation. Moderate-intensity exercise ideas include: Walking 1 mile (1.6 km) in about 15 minutes Biking Hiking Golfing Dancing Water aerobics Patient will verbalize understanding of everyday activities that increase physical activity by providing examples like the following: Yard work, such as: Sales promotion account executive Gardening Washing windows or floors Patient will be able to explain general safety guidelines for exercising:  Before you start a new exercise program, talk with your health care provider. Do not exercise so much that you hurt yourself, feel dizzy, or get very short of breath. Wear comfortable clothes and wear shoes with good support. Drink plenty of water while you exercise to prevent dehydration or heat stroke. Work out until your breathing and your heartbeat get faster.          This is a list of Health Maintenance Items that are overdue or due now: Shingrix vaccine  Orders/Referrals Placed Today: No orders of the defined types were placed in this encounter.  (Contact our referral department at [phone] if you have not spoken with someone about your referral appointment within the next 5 days)    Follow-up Plan Follow-up with [name], MD as planned Schedule your shingles vaccine at the pharmacy.  Follow up on memory test (6 CIT) at next in office visit. Patient did not  want to schedule his next year's medicare wellness visit at this time.  Please schedule Medicare wellness visit in one year. AVS printed and mailed to the patient.      Health Maintenance, Male Adopting a healthy lifestyle and getting preventive  care are important in promoting health and wellness. Ask your health care provider about: The right schedule for you to have regular tests and exams. Things you can do on your own to prevent diseases and keep yourself healthy. What should I know about diet, weight, and exercise? Eat a healthy diet  Eat a diet that includes plenty of vegetables, fruits, low-fat dairy products, and lean protein. Do not eat a lot of foods that are high in solid fats, added sugars, or sodium. Maintain a healthy weight Body mass index (BMI) is a measurement that can be used to identify possible weight problems. It estimates body fat based on height and weight. Your health care provider can help determine your BMI and help you achieve or maintain a healthy weight. Get regular exercise Get regular exercise. This is one of the most important things you can do for your health. Most adults should: Exercise for at least 150 minutes each week. The exercise should increase your heart rate and make you sweat (moderate-intensity exercise). Do strengthening exercises at least twice a week. This is in addition to the moderate-intensity exercise. Spend less time sitting. Even light physical activity can be beneficial. Watch cholesterol and blood lipids Have your blood tested for lipids and cholesterol at 78 years of age, then have this test every 5 years. You may need to have your cholesterol levels checked more often if: Your lipid or cholesterol levels are high. You are older than 78 years of age. You are at high risk for heart disease. What should I know about cancer screening? Many types of cancers can be detected early and may often be prevented. Depending on your health history and family history, you may need to have cancer screening at various ages. This may include screening for: Colorectal cancer. Prostate cancer. Skin cancer. Lung cancer. What should I know about heart disease, diabetes, and high blood  pressure? Blood pressure and heart disease High blood pressure causes heart disease and increases the risk of stroke. This is more likely to develop in people who have high blood pressure readings or are overweight. Talk with your health care provider about your target blood pressure readings. Have your blood pressure checked: Every 3-5 years if you are 9-68 years of age. Every year if you are 35 years old or older. If you are between the ages of 26 and 45 and are a current or former smoker, ask your health care provider if you should have a one-time screening for abdominal aortic aneurysm (AAA). Diabetes Have regular diabetes screenings. This checks your fasting blood sugar level. Have the screening done: Once every three years after age 62 if you are at a normal weight and have a low risk for diabetes. More often and at a younger age if you are overweight or have a high risk for diabetes. What should I know about preventing infection? Hepatitis B If you have a higher risk for hepatitis B, you should be screened for this virus. Talk with your health care provider to find out if you are at risk for hepatitis B infection. Hepatitis C Blood testing is recommended for: Everyone born from [birth year] through 1967. Anyone with known risk factors for hepatitis C. Sexually transmitted infections (STIs) You  should be screened each year for STIs, including gonorrhea and chlamydia, if: You are sexually active and are younger than 78 years of age. You are older than 78 years of age and your health care provider tells you that you are at risk for this type of infection. Your sexual activity has changed since you were last screened, and you are at increased risk for chlamydia or gonorrhea. Ask your health care provider if you are at risk. Ask your health care provider about whether you are at high risk for HIV. Your health care provider may recommend a prescription medicine to help prevent HIV infection. If you  choose to take medicine to prevent HIV, you should first get tested for HIV. You should then be tested every 3 months for as long as you are taking the medicine. Follow these instructions at home: Alcohol use Do not drink alcohol if your health care provider tells you not to drink. If you drink alcohol: Limit how much you have to 0-2 drinks a day. Know how much alcohol is in your drink. In the [place] one drink equals one 12 oz bottle of beer (355 mL), one 5 oz glass of wine (148 mL), or one 1 oz glass of hard liquor (44 mL). Lifestyle Do not use any products that contain nicotine or tobacco. These products include cigarettes, chewing tobacco, and vaping devices, such as e-cigarettes. If you need help quitting, ask your health care provider. Do not use street drugs. Do not share needles. Ask your health care provider for help if you need support or information about quitting drugs. General instructions Schedule regular health, dental, and eye exams. Stay current with your vaccines. Tell your health care provider if: You often feel depressed. You have ever been abused or do not feel safe at home. Summary Adopting a healthy lifestyle and getting preventive care are important in promoting health and wellness. Follow your health care provider's instructions about healthy diet, exercising, and getting tested or screened for diseases. Follow your health care provider's instructions on monitoring your cholesterol and blood pressure. This information is not intended to replace advice given to you by your health care provider. Make sure you discuss any questions you have with your health care provider. Document Revised: 03/17/2021 Document Reviewed: 03/17/2021 Elsevier Patient Education  [place].  Hearing Loss Hearing loss is a partial or total loss of the ability to hear. This can be temporary or permanent, and it can happen in one or both ears. Medical care is necessary to treat  hearing loss properly and to prevent the condition from getting worse. Your hearing may partially or completely come back, depending on what caused your hearing loss and how severe it is. In some cases, hearing loss is permanent. What are the causes? Common causes of hearing loss include: Too much wax in the ear canal. Infection of the ear canal or middle ear. Fluid in the middle ear. Injury to the ear or surrounding area. An object stuck in the ear. A history of prolonged exposure to loud sounds, such as music. Less common causes of hearing loss include: Tumors in the ear. Viral or bacterial infections, such as meningitis. A hole in the eardrum (perforated eardrum). Problems with the hearing nerve that sends signals between the brain and the ear. Certain medicines. What are the signs or symptoms? Symptoms of this condition may include: Difficulty telling the difference between sounds. Difficulty following a conversation when there is background noise. Lack of response to  sounds in your environment. This may be most noticeable when you do not respond to startling sounds. Needing to turn up the volume on the television, radio, or other devices. Ringing in the ears. Dizziness. How is this diagnosed? This condition is diagnosed based on: A physical exam. A hearing test (audiometry). The audiometry test will be performed by a hearing specialist (audiologist). You may also be referred to an ear, nose, and throat (ENT) specialist (otolaryngologist). How is this treated? Treatment for hearing loss may include: Ear wax removal. Medicines to treat or prevent infection (antibiotics). Medicines to reduce inflammation (corticosteroids). Hearing aids for hearing loss related to nerve damage. Follow these instructions at home: If you were prescribed an antibiotic medicine, take it as told by your health care provider. Do not stop taking the antibiotic even if you start to feel better. Take  over-the-counter and prescription medicines only as told by your health care provider. Avoid loud noises. Return to your normal activities as told by your health care provider. Ask your health care provider what activities are safe for you. Keep all follow-up visits as told by your health care provider. This is important. Contact a health care provider if: You feel dizzy. You develop new symptoms. You vomit or feel nauseous. You have a fever. Get help right away if: You develop sudden changes in your vision. You have severe ear pain. You have new or increased weakness. You have a severe headache. Summary Hearing loss is a decreased ability to hear sounds around you. It can be temporary or permanent. Treatment will depend on the cause of your hearing loss. It may include ear wax removal, medicines, or a hearing aid. Your hearing may partially or completely come back, depending on what caused your hearing loss and how severe it is. Keep all follow-up visits as told by your health care provider. This is important. This information is not intended to replace advice given to you by your health care provider. Make sure you discuss any questions you have with your health care provider. Document Revised: 07/26/2018 Document Reviewed: 07/26/2018 Elsevier Patient Education  [place].

## 2021-11-24 ENCOUNTER — Other Ambulatory Visit: Payer: Self-pay | Admitting: Family Medicine

## 2021-11-26 ENCOUNTER — Other Ambulatory Visit: Payer: Self-pay

## 2021-11-26 DIAGNOSIS — E039 Hypothyroidism, unspecified: Secondary | ICD-10-CM

## 2021-11-26 LAB — TSH: TSH: 3.04 mIU/L (ref 0.40–4.50)

## 2021-11-26 NOTE — Progress Notes (Signed)
Your lab work is within acceptable range and there are no concerning findings.   ?

## 2021-11-26 NOTE — Progress Notes (Signed)
Ordered labs

## 2021-12-11 ENCOUNTER — Other Ambulatory Visit: Payer: Self-pay | Admitting: Family Medicine

## 2021-12-11 DIAGNOSIS — E039 Hypothyroidism, unspecified: Secondary | ICD-10-CM

## 2021-12-30 DIAGNOSIS — Z87891 Personal history of nicotine dependence: Secondary | ICD-10-CM | POA: Diagnosis not present

## 2021-12-30 DIAGNOSIS — Z8719 Personal history of other diseases of the digestive system: Secondary | ICD-10-CM | POA: Diagnosis not present

## 2021-12-30 DIAGNOSIS — I129 Hypertensive chronic kidney disease with stage 1 through stage 4 chronic kidney disease, or unspecified chronic kidney disease: Secondary | ICD-10-CM | POA: Diagnosis not present

## 2021-12-30 DIAGNOSIS — K625 Hemorrhage of anus and rectum: Secondary | ICD-10-CM | POA: Diagnosis not present

## 2021-12-30 DIAGNOSIS — K648 Other hemorrhoids: Secondary | ICD-10-CM | POA: Diagnosis not present

## 2021-12-30 DIAGNOSIS — I482 Chronic atrial fibrillation, unspecified: Secondary | ICD-10-CM | POA: Diagnosis not present

## 2021-12-30 DIAGNOSIS — Z9049 Acquired absence of other specified parts of digestive tract: Secondary | ICD-10-CM | POA: Diagnosis not present

## 2021-12-30 DIAGNOSIS — Z85030 Personal history of malignant carcinoid tumor of large intestine: Secondary | ICD-10-CM | POA: Diagnosis not present

## 2021-12-30 DIAGNOSIS — K5521 Angiodysplasia of colon with hemorrhage: Secondary | ICD-10-CM | POA: Diagnosis not present

## 2021-12-30 DIAGNOSIS — I999 Unspecified disorder of circulatory system: Secondary | ICD-10-CM | POA: Diagnosis not present

## 2021-12-30 DIAGNOSIS — Z85038 Personal history of other malignant neoplasm of large intestine: Secondary | ICD-10-CM | POA: Diagnosis not present

## 2021-12-30 DIAGNOSIS — D72819 Decreased white blood cell count, unspecified: Secondary | ICD-10-CM | POA: Diagnosis not present

## 2021-12-30 DIAGNOSIS — K5790 Diverticulosis of intestine, part unspecified, without perforation or abscess without bleeding: Secondary | ICD-10-CM | POA: Diagnosis not present

## 2021-12-30 DIAGNOSIS — K921 Melena: Secondary | ICD-10-CM | POA: Diagnosis not present

## 2021-12-30 DIAGNOSIS — I4891 Unspecified atrial fibrillation: Secondary | ICD-10-CM | POA: Diagnosis not present

## 2021-12-30 DIAGNOSIS — I69354 Hemiplegia and hemiparesis following cerebral infarction affecting left non-dominant side: Secondary | ICD-10-CM | POA: Diagnosis not present

## 2021-12-30 DIAGNOSIS — D62 Acute posthemorrhagic anemia: Secondary | ICD-10-CM | POA: Diagnosis not present

## 2021-12-30 DIAGNOSIS — Z7901 Long term (current) use of anticoagulants: Secondary | ICD-10-CM | POA: Diagnosis not present

## 2021-12-30 DIAGNOSIS — Z8673 Personal history of transient ischemic attack (TIA), and cerebral infarction without residual deficits: Secondary | ICD-10-CM | POA: Diagnosis not present

## 2021-12-30 DIAGNOSIS — N184 Chronic kidney disease, stage 4 (severe): Secondary | ICD-10-CM | POA: Diagnosis not present

## 2021-12-30 DIAGNOSIS — D649 Anemia, unspecified: Secondary | ICD-10-CM | POA: Diagnosis not present

## 2021-12-30 DIAGNOSIS — N179 Acute kidney failure, unspecified: Secondary | ICD-10-CM | POA: Diagnosis not present

## 2021-12-30 DIAGNOSIS — D696 Thrombocytopenia, unspecified: Secondary | ICD-10-CM | POA: Diagnosis not present

## 2021-12-30 DIAGNOSIS — K635 Polyp of colon: Secondary | ICD-10-CM | POA: Diagnosis not present

## 2021-12-30 LAB — HM COLONOSCOPY

## 2021-12-31 DIAGNOSIS — K5730 Diverticulosis of large intestine without perforation or abscess without bleeding: Secondary | ICD-10-CM | POA: Diagnosis not present

## 2021-12-31 DIAGNOSIS — D696 Thrombocytopenia, unspecified: Secondary | ICD-10-CM | POA: Diagnosis not present

## 2021-12-31 DIAGNOSIS — Z85030 Personal history of malignant carcinoid tumor of large intestine: Secondary | ICD-10-CM | POA: Diagnosis not present

## 2021-12-31 DIAGNOSIS — I482 Chronic atrial fibrillation, unspecified: Secondary | ICD-10-CM | POA: Diagnosis not present

## 2021-12-31 DIAGNOSIS — D649 Anemia, unspecified: Secondary | ICD-10-CM | POA: Diagnosis not present

## 2021-12-31 DIAGNOSIS — D123 Benign neoplasm of transverse colon: Secondary | ICD-10-CM | POA: Diagnosis not present

## 2021-12-31 DIAGNOSIS — K641 Second degree hemorrhoids: Secondary | ICD-10-CM | POA: Diagnosis not present

## 2021-12-31 DIAGNOSIS — D62 Acute posthemorrhagic anemia: Secondary | ICD-10-CM | POA: Diagnosis not present

## 2021-12-31 DIAGNOSIS — K5521 Angiodysplasia of colon with hemorrhage: Secondary | ICD-10-CM | POA: Diagnosis not present

## 2021-12-31 DIAGNOSIS — K921 Melena: Secondary | ICD-10-CM | POA: Diagnosis not present

## 2021-12-31 DIAGNOSIS — K635 Polyp of colon: Secondary | ICD-10-CM | POA: Diagnosis not present

## 2021-12-31 DIAGNOSIS — K5520 Angiodysplasia of colon without hemorrhage: Secondary | ICD-10-CM | POA: Diagnosis not present

## 2022-01-02 DIAGNOSIS — Z9049 Acquired absence of other specified parts of digestive tract: Secondary | ICD-10-CM | POA: Diagnosis not present

## 2022-01-02 DIAGNOSIS — D649 Anemia, unspecified: Secondary | ICD-10-CM | POA: Diagnosis not present

## 2022-01-02 DIAGNOSIS — D6869 Other thrombophilia: Secondary | ICD-10-CM | POA: Diagnosis not present

## 2022-01-02 DIAGNOSIS — I480 Paroxysmal atrial fibrillation: Secondary | ICD-10-CM | POA: Diagnosis not present

## 2022-01-02 DIAGNOSIS — R531 Weakness: Secondary | ICD-10-CM | POA: Diagnosis not present

## 2022-01-02 DIAGNOSIS — J449 Chronic obstructive pulmonary disease, unspecified: Secondary | ICD-10-CM | POA: Diagnosis not present

## 2022-01-02 DIAGNOSIS — Z85038 Personal history of other malignant neoplasm of large intestine: Secondary | ICD-10-CM | POA: Diagnosis not present

## 2022-01-05 ENCOUNTER — Other Ambulatory Visit: Payer: Self-pay

## 2022-01-05 ENCOUNTER — Ambulatory Visit (INDEPENDENT_AMBULATORY_CARE_PROVIDER_SITE_OTHER): Payer: PPO

## 2022-01-05 ENCOUNTER — Ambulatory Visit (INDEPENDENT_AMBULATORY_CARE_PROVIDER_SITE_OTHER): Payer: PPO | Admitting: Family Medicine

## 2022-01-05 ENCOUNTER — Encounter: Payer: Self-pay | Admitting: Family Medicine

## 2022-01-05 VITALS — BP 145/50 | Resp 18 | Ht 68.0 in

## 2022-01-05 DIAGNOSIS — M79675 Pain in left toe(s): Secondary | ICD-10-CM | POA: Diagnosis not present

## 2022-01-05 DIAGNOSIS — M25572 Pain in left ankle and joints of left foot: Secondary | ICD-10-CM | POA: Diagnosis not present

## 2022-01-05 DIAGNOSIS — D709 Neutropenia, unspecified: Secondary | ICD-10-CM | POA: Diagnosis not present

## 2022-01-05 DIAGNOSIS — M109 Gout, unspecified: Secondary | ICD-10-CM

## 2022-01-05 DIAGNOSIS — D500 Iron deficiency anemia secondary to blood loss (chronic): Secondary | ICD-10-CM

## 2022-01-05 DIAGNOSIS — I10 Essential (primary) hypertension: Secondary | ICD-10-CM

## 2022-01-05 NOTE — Assessment & Plan Note (Signed)
BP up a little today.  Will monitor.

## 2022-01-05 NOTE — Progress Notes (Signed)
Established Patient Office Visit -hospital follow-up  Subjective:  Patient ID: Roberto Fields, male    DOB: 1945/11/19  Age: 76 y.o. MRN: [id number]  CC:  Chief Complaint  Patient presents with   [facility]. Patient would like to discuss if he should continue holding Eliquis or should he restart it.    Foot Concern    Pin and swelling in left foot for several days    Discoloration    Fingertips and lips turning blue     HPI Roberto Fields presents for hospital follow-up.  He was admitted to [facility] on February 21 and discharged home the following day on February 22.  He was admitted for rectal bleeding with bright red blood per rectum while on Eliquis for A-fib and stroke.  Upon admission hemoglobin was 7.1.  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  He was diagnosed with severe anemia.  Given 1 unit of packed red blood cells and discharged home the following day and told to hold his Eliquis.  Unfortunately he had another large bleeding polyp.  They are planning on doing another colonoscopy in 6 months.  He just feels extremely weak and tired.  Creatinine improved during admission and discharged home with a serum creatinine of 2.04.  Also noted on colonoscopy precancerous serrated polyp removed and it was quite large.  It had to be removed in pieces so they recommended a follow-up colonoscopy in 6 months.  He is holding his Eliquis until his hemoglobin comes back up.  Within a day or 2 of coming home he started to notice a lot of pain and swelling in his left great toe.  His wife noticed yesterday that his lips actually looked blue.  But he seems a little better this morning.  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 Dr Unk Lightning removed a larger polyp   CORONARY STENT PLACEMENT  04/2011   LAD, [facility]   ESOPHAGOGASTRODUODENOSCOPY     dr gibbs in [place] and 1989   TRANSURETHRAL RESECTION OF PROSTATE      Family History  Problem Relation Age of Onset   Heart disease Father  Heart attack Father    Coronary artery disease Brother    Heart attack Brother 22       pacemaker and defibrillator   Coronary artery disease Brother    Leukemia Mother    Hypertension Other        family history of   Heart disease Sister    Stomach cancer Sister    Breast cancer Sister    Lung cancer Sister    Breast cancer Sister    Lung cancer Sister    Colon cancer Neg Hx    Esophageal cancer Neg Hx    Rectal cancer Neg Hx     Social History   Socioeconomic History   Marital status: Married    Spouse name: Roberto Fields   Number of children: 3   Years of education: 14   Highest education level: Associate degree: academic program  Occupational History   Occupation: Retired  Tobacco Use   Smoking status: Former    Types: Cigarettes    Quit date: 10/02/1984    Years since quitting: 37.2   Smokeless tobacco:  Never  Vaping Use   Vaping Use: Never used  Substance and Sexual Activity   Alcohol use: No   Drug use: No   Sexual activity: Not on file  Other Topics Concern   Not on file  Social History Narrative   Lives with his wife. He had three children but one has deceased. He enjoys watching televison.   Social Determinants of Health   Financial Resource Strain: Low Risk    Difficulty of Paying Living Expenses: Not hard at all  Food Insecurity: No Food Insecurity   Worried About Charity fundraiser in the Last Year: Never true   [place] in the Last Year: Never true  Transportation Needs: No Transportation Needs   Lack of Transportation (Medical): No   Lack of Transportation (Non-Medical): No  Physical Activity: Inactive   Days of Exercise per Week: 0 days   Minutes of Exercise per Session: 0 min  Stress: No Stress Concern Present   Feeling of Stress : Not at all  Social Connections: Moderately Integrated   Frequency of Communication with Friends and Family: Once a week   Frequency of Social Gatherings with Friends and Family: Never   Attends Religious Services: More than 4 times per year   Active Member of [employer] or Organizations: Yes   Attends Music therapist: More than 4 times per year   Marital Status: Married  Human resources officer Violence: Not At Risk   Fear of Current or Ex-Partner: No   Emotionally Abused: No   Physically Abused: No   Sexually Abused: No    Outpatient Medications Prior to Visit  Medication Sig Dispense Refill   Acetaminophen (TYLENOL PO) Take by mouth daily as needed.      albuterol (VENTOLIN HFA) 108 (90 Base) MCG/ACT inhaler Inhale 2 puffs into the lungs every 6 (six) hours as needed for wheezing or shortness of breath. 8 g 0   amLODipine (NORVASC) 5 MG tablet Take 5 mg by mouth daily.     colchicine 0.6 MG tablet 2 tabs po on Day 1 of gout flare, then 1 tab QD until gout flare is over. 30 tablet 0   Ferrous Sulfate (IRON SUPPLEMENT  PO) Take 1 tablet by mouth daily.     furosemide (LASIX) 20 MG tablet Take 20 mg by mouth daily.     gabapentin (NEURONTIN) 300 MG capsule  Take 600 mg by mouth 2 (two) times daily.      Latanoprost (XELPROS) 0.005 % EMUL Apply 1 drop to eye at bedtime.     levothyroxine (SYNTHROID) 88 MCG tablet Take 1 tablet by mouth once daily 90 tablet 3   meclizine (ANTIVERT) 25 MG tablet TAKE 1 TABLET BY MOUTH THREE TIMES DAILY AS NEEDED FOR DIZZINESS 90 tablet 0   niacin (NIASPAN) 500 MG CR tablet Take 500 mg by mouth daily.     nitroGLYCERIN (NITROSTAT) 0.4 MG SL tablet DISSOLVE 1 TABLET UNDER THE TONGUE EVERY 5 MINUTES AS NEEDED FOR CHEST PAIN 100 tablet 0   omeprazole (PRILOSEC) 40 MG capsule Take 1 capsule by mouth once daily 90 capsule 0   REFRESH CELLUVISC 1 % GEL Apply 1 drop to eye 3 (three) times daily.      telmisartan (MICARDIS) 20 MG tablet Take 1 tablet (20 mg total) by mouth daily. 90 tablet 3   timolol (TIMOPTIC) 0.5 % ophthalmic solution Place 1 drop into both eyes daily.      vitamin B-12 (CYANOCOBALAMIN) 1000 MCG tablet Take 1 tablet by mouth daily.     apixaban (ELIQUIS) 5 MG TABS tablet Take 1 tablet (5 mg total) by mouth 2 (two) times daily. (Patient not taking: Reported on 01/05/2022) 180 tablet 3   isosorbide mononitrate (IMDUR) 30 MG 24 hr tablet Take 0.5 tablets (15 mg total) by mouth daily. 45 tablet 3   amLODipine (NORVASC) 10 MG tablet Take 1 tablet (10 mg total) by mouth daily. 90 tablet 3   No facility-administered medications prior to visit.    Allergies  Allergen Reactions   Atorvastatin Other (See Comments)    REACTION: Muscle weakness   Nsaids Other (See Comments)    Avoid with current kidney function.     Colesevelam Other (See Comments)    REACTION: joint pain   Pravastatin Other (See Comments)    Muscle aches   Simvastatin Other (See Comments)    REACTION: Myalgias    ROS Review of Systems    Objective:    Physical Exam Constitutional:       Appearance: Normal appearance. He is well-developed.  HENT:     Head: Normocephalic and atraumatic.  Cardiovascular:     Rate and Rhythm: Normal rate and regular rhythm.     Heart sounds: Murmur heard.  Pulmonary:     Effort: Pulmonary effort is normal.     Breath sounds: Normal breath sounds.  Musculoskeletal:     Comments: Left great toe is swollen and erythematous there is some bruising as well near the PIP joint.  Skin:    General: Skin is warm and dry.     Comments: Nailbeds are blue on exam and fingertips are pale.  Neurological:     Mental Status: He is alert and oriented to person, place, and time. Mental status is at baseline.  Psychiatric:        Behavior: Behavior normal.    BP (!) 145/50    Resp 18    Ht 5\' 8"  (1.727 m)    BMI 25.87 kg/m  Wt Readings from Last 3 Encounters:  10/20/21 170 lb 1.9 oz (77.2 kg)  09/29/21 167 lb 12.8 oz (76.1 kg)  09/03/21 162 lb (73.5 kg)     Health Maintenance Due  Topic Date Due   COVID-19 Vaccine (3 - Moderna risk series) 06/18/2021    There are no preventive care reminders to display for this patient.  Lab  Results  Component Value Date   TSH 3.04 11/26/2021   Lab Results  Component Value Date   WBC 3.5 (L) 10/20/2021   HGB 9.3 (L) 10/20/2021   HCT 29.2 (L) 10/20/2021   MCV 108.1 (H) 10/20/2021   PLT 150 10/20/2021   Lab Results  Component Value Date   NA 143 10/20/2021   K 4.9 10/20/2021   CO2 27 10/20/2021   GLUCOSE 98 10/20/2021   BUN 32 (H) 10/20/2021   CREATININE 2.23 (H) 10/20/2021   BILITOT 0.4 10/20/2021   ALKPHOS 110 10/20/2021   AST 10 (L) 10/20/2021   ALT 10 10/20/2021   PROT 6.3 (L) 10/20/2021   ALBUMIN 4.2 10/20/2021   CALCIUM 9.5 10/20/2021   ANIONGAP 7 10/20/2021   GFR 36.14 (L) 08/19/2020   Lab Results  Component Value Date   CHOL 176 09/03/2021   Lab Results  Component Value Date   HDL 36 (L) 09/03/2021   Lab Results  Component Value Date   LDLCALC 119 (H) 09/03/2021   Lab  Results  Component Value Date   TRIG 104 09/03/2021   Lab Results  Component Value Date   CHOLHDL 4.9 09/03/2021   Lab Results  Component Value Date   HGBA1C 5.5 08/26/2015      Assessment & Plan:   Problem List Items Addressed This Visit       Cardiovascular and Mediastinum   Primary hypertension - Primary   Relevant Orders   COMPLETE METABOLIC PANEL WITH GFR   HYPERTENSION, BENIGN ESSENTIAL    BP up a little today.  Will monitor.          Other   Gout   Relevant Orders   CBC with Differential/Platelet   COMPLETE METABOLIC PANEL WITH GFR   Uric acid   Other Visit Diagnoses     Blood loss anemia       Relevant Orders   CBC with Differential/Platelet   Great toe pain, left       Relevant Orders   DG Toe Great Left   Neutropenia, unspecified type ([place])           Blood Loss anemia-we will check CBC today.  Hemoglobin at discharge on February 22 was 8.5.  Blood cell count was also low at 1.5.  Great toe pain-suspect gout flare but there is some bruising so we will make sure there is no underlying fracture or injury.  Neutropenia-I am also concerned about his white blood cell count.  On December 12 his white blood cell count was 3.5 and upon discharge from the hospital it was 1.5.  We will repeat a CBC today to see if it is trending upward.  No orders of the defined types were placed in this encounter.  I spent 45 minutes on the day of the encounter to include pre-visit record review, face-to-face time with the patient and post visit ordering of test.   Follow-up: No follow-ups on file.    Beatrice Lecher, MD

## 2022-01-06 LAB — CBC WITH DIFFERENTIAL/PLATELET
Absolute Monocytes: 183 cells/uL — ABNORMAL LOW (ref 200–950)
Basophils Absolute: 11 cells/uL (ref 0–200)
Basophils Relative: 0.5 %
Eosinophils Absolute: 11 cells/uL — ABNORMAL LOW (ref 15–500)
Eosinophils Relative: 0.5 %
HCT: 24.1 % — ABNORMAL LOW (ref 38.5–50.0)
Hemoglobin: 7.8 g/dL — ABNORMAL LOW (ref 13.2–17.1)
Lymphs Abs: 596 cells/uL — ABNORMAL LOW (ref 850–3900)
MCH: 34.4 pg — ABNORMAL HIGH (ref 27.0–33.0)
MCHC: 32.4 g/dL (ref 32.0–36.0)
MCV: 106.2 fL — ABNORMAL HIGH (ref 80.0–100.0)
MPV: 11.9 fL (ref 7.5–12.5)
Monocytes Relative: 8.7 %
Neutro Abs: 1930 cells/uL (ref 1500–7800)
Neutrophils Relative %: 91.9 %
Platelets: 100 10*3/uL — ABNORMAL LOW (ref 140–400)
RBC: 2.27 10*6/uL — ABNORMAL LOW (ref 4.20–5.80)
RDW: 14.4 % (ref 11.0–15.0)
Total Lymphocyte: 28.4 %
WBC: 2.1 10*3/uL — ABNORMAL LOW (ref 3.8–10.8)

## 2022-01-06 LAB — COMPLETE METABOLIC PANEL WITH GFR
AG Ratio: 1.7 (calc) (ref 1.0–2.5)
ALT: 16 U/L (ref 9–46)
AST: 13 U/L (ref 10–35)
Albumin: 4 g/dL (ref 3.6–5.1)
Alkaline phosphatase (APISO): 90 U/L (ref 35–144)
BUN/Creatinine Ratio: 13 (calc) (ref 6–22)
BUN: 31 mg/dL — ABNORMAL HIGH (ref 7–25)
CO2: 24 mmol/L (ref 20–32)
Calcium: 9.1 mg/dL (ref 8.6–10.3)
Chloride: 111 mmol/L — ABNORMAL HIGH (ref 98–110)
Creat: 2.33 mg/dL — ABNORMAL HIGH (ref 0.70–1.28)
Globulin: 2.3 g/dL (calc) (ref 1.9–3.7)
Glucose, Bld: 106 mg/dL — ABNORMAL HIGH (ref 65–99)
Potassium: 5.6 mmol/L — ABNORMAL HIGH (ref 3.5–5.3)
Sodium: 142 mmol/L (ref 135–146)
Total Bilirubin: 0.8 mg/dL (ref 0.2–1.2)
Total Protein: 6.3 g/dL (ref 6.1–8.1)
eGFR: 28 mL/min/{1.73_m2} — ABNORMAL LOW (ref >=60)

## 2022-01-06 LAB — URIC ACID: Uric Acid, Serum: 9.8 mg/dL — ABNORMAL HIGH (ref 4.0–8.0)

## 2022-01-06 NOTE — Progress Notes (Signed)
Call patient: He is even more anemic than 2 months ago his hemoglobin was 9 in December it is now 7.8.  I think he is definitely get a have to restart his Aranesp or maybe additional treatment.  When is his next appoint with Dr. Marin Olp?  If it is not in the next couple of weeks then we need to go ahead and get him in urgently with Dr. Marin Olp.  Potassium is a little bit elevated.  I know he has not taken his Lasix in a while.  But go ahead and have him take a dose today and then I want to recheck his potassium on Friday before the weekend..  His uric acid levels are also very high so I suspect that the swelling in his foot is probably a gout flare.  I think he used to be on allopurinol and we can consider restarting that after the flare is gone but for now please start the colchicine.

## 2022-01-07 ENCOUNTER — Other Ambulatory Visit: Payer: Self-pay

## 2022-01-07 ENCOUNTER — Telehealth: Payer: Self-pay

## 2022-01-07 DIAGNOSIS — C187 Malignant neoplasm of sigmoid colon: Secondary | ICD-10-CM

## 2022-01-07 NOTE — Progress Notes (Signed)
Call patient: X-ray of the toe looks good no sign of fracture or injury I do think this is his gout so would definitely start the colchicine if they have not already.

## 2022-01-07 NOTE — Telephone Encounter (Signed)
Received VM from pt's daughter Roberto Fields stating they got a call "saying his blood level is 7. Do we need to take dad to the hospital or is there something you can do for him?" ?Reviewed labs in [place]. Pt's hgb 7.8. scheduled to see Dr Marin Olp on Monday, 3/6. Per Dr Marin Olp, message to scheduling to move pt's appts up to tomorrow for lab/MD/possible blood transfusion. Deisy, scheduler contacted [place] with this information. dph ?

## 2022-01-08 ENCOUNTER — Telehealth: Payer: Self-pay | Admitting: *Deleted

## 2022-01-08 ENCOUNTER — Other Ambulatory Visit: Payer: Self-pay | Admitting: *Deleted

## 2022-01-08 ENCOUNTER — Inpatient Hospital Stay: Payer: PPO | Attending: Hematology & Oncology

## 2022-01-08 ENCOUNTER — Inpatient Hospital Stay: Payer: PPO

## 2022-01-08 ENCOUNTER — Other Ambulatory Visit: Payer: Self-pay

## 2022-01-08 ENCOUNTER — Inpatient Hospital Stay: Payer: PPO | Admitting: Hematology & Oncology

## 2022-01-08 ENCOUNTER — Encounter: Payer: Self-pay | Admitting: Hematology & Oncology

## 2022-01-08 VITALS — BP 135/50 | HR 57 | Temp 97.7°F | Resp 16

## 2022-01-08 VITALS — BP 98/38 | HR 59 | Temp 97.8°F | Resp 17 | Wt 169.0 lb

## 2022-01-08 DIAGNOSIS — N1832 Chronic kidney disease, stage 3b: Secondary | ICD-10-CM | POA: Diagnosis not present

## 2022-01-08 DIAGNOSIS — I480 Paroxysmal atrial fibrillation: Secondary | ICD-10-CM | POA: Insufficient documentation

## 2022-01-08 DIAGNOSIS — D631 Anemia in chronic kidney disease: Secondary | ICD-10-CM | POA: Diagnosis not present

## 2022-01-08 DIAGNOSIS — D500 Iron deficiency anemia secondary to blood loss (chronic): Secondary | ICD-10-CM | POA: Diagnosis not present

## 2022-01-08 DIAGNOSIS — C187 Malignant neoplasm of sigmoid colon: Secondary | ICD-10-CM

## 2022-01-08 DIAGNOSIS — Z85038 Personal history of other malignant neoplasm of large intestine: Secondary | ICD-10-CM | POA: Insufficient documentation

## 2022-01-08 DIAGNOSIS — Z7901 Long term (current) use of anticoagulants: Secondary | ICD-10-CM | POA: Insufficient documentation

## 2022-01-08 LAB — CBC WITH DIFFERENTIAL (CANCER CENTER ONLY)
Abs Immature Granulocytes: 0.02 10*3/uL (ref 0.00–0.07)
Basophils Absolute: 0 10*3/uL (ref 0.0–0.1)
Basophils Relative: 0 %
Eosinophils Absolute: 0 10*3/uL (ref 0.0–0.5)
Eosinophils Relative: 1 %
HCT: 23.5 % — ABNORMAL LOW (ref 39.0–52.0)
Hemoglobin: 7.5 g/dL — ABNORMAL LOW (ref 13.0–17.0)
Immature Granulocytes: 1 %
Lymphocytes Relative: 39 %
Lymphs Abs: 0.7 10*3/uL (ref 0.7–4.0)
MCH: 34.7 pg — ABNORMAL HIGH (ref 26.0–34.0)
MCHC: 31.9 g/dL (ref 30.0–36.0)
MCV: 108.8 fL — ABNORMAL HIGH (ref 80.0–100.0)
Monocytes Absolute: 0.1 10*3/uL (ref 0.1–1.0)
Monocytes Relative: 8 %
Neutro Abs: 0.9 10*3/uL — ABNORMAL LOW (ref 1.7–7.7)
Neutrophils Relative %: 51 %
Platelet Count: 141 10*3/uL — ABNORMAL LOW (ref 150–400)
RBC: 2.16 MIL/uL — ABNORMAL LOW (ref 4.22–5.81)
RDW: 15.1 % (ref 11.5–15.5)
Smear Review: DECREASED
WBC Count: 1.8 10*3/uL — ABNORMAL LOW (ref 4.0–10.5)
nRBC: 0 % (ref 0.0–0.2)

## 2022-01-08 LAB — CMP (CANCER CENTER ONLY)
ALT: 21 U/L (ref 0–44)
AST: 20 U/L (ref 15–41)
Albumin: 3.5 g/dL (ref 3.5–5.0)
Alkaline Phosphatase: 108 U/L (ref 38–126)
Anion gap: 10 (ref 5–15)
BUN: 41 mg/dL — ABNORMAL HIGH (ref 8–23)
CO2: 24 mmol/L (ref 22–32)
Calcium: 8.9 mg/dL (ref 8.9–10.3)
Chloride: 107 mmol/L (ref 98–111)
Creatinine: 2.49 mg/dL — ABNORMAL HIGH (ref 0.61–1.24)
GFR, Estimated: 26 mL/min — ABNORMAL LOW (ref >60)
Glucose, Bld: 123 mg/dL — ABNORMAL HIGH (ref 70–99)
Potassium: 5 mmol/L (ref 3.5–5.1)
Sodium: 141 mmol/L (ref 135–145)
Total Bilirubin: 0.4 mg/dL (ref 0.3–1.2)
Total Protein: 6.4 g/dL — ABNORMAL LOW (ref 6.5–8.1)

## 2022-01-08 LAB — IRON AND IRON BINDING CAPACITY (CC-WL,HP ONLY)
Iron: 77 ug/dL (ref 45–182)
Saturation Ratios: 25 % (ref 17.9–39.5)
TIBC: 312 ug/dL (ref 250–450)
UIBC: 235 ug/dL (ref 117–376)

## 2022-01-08 LAB — PREPARE RBC (CROSSMATCH)

## 2022-01-08 LAB — SAMPLE TO BLOOD BANK

## 2022-01-08 LAB — FERRITIN: Ferritin: 239 ng/mL (ref 24–336)

## 2022-01-08 MED ORDER — DARBEPOETIN ALFA 300 MCG/0.6ML IJ SOSY
300.0000 ug | PREFILLED_SYRINGE | Freq: Once | INTRAMUSCULAR | Status: AC
  Administered 2022-01-08: 300 ug via SUBCUTANEOUS
  Filled 2022-01-08: qty 0.6

## 2022-01-08 MED ORDER — SODIUM CHLORIDE 0.9% IV SOLUTION: 250.0000 mL | Freq: Once | INTRAVENOUS | Status: DC

## 2022-01-08 NOTE — Telephone Encounter (Signed)
Per 01/08/22 los - gave upcoming appointments - confirmed ?

## 2022-01-08 NOTE — Progress Notes (Signed)
Hematology and Oncology Follow Up Visit  Roberto Fields [id number] November 17, 1945 76 y.o. 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  Unfortunately, he recently had a bout of GI bleeding.  He had rectal bleeding.  He had a colonoscopy which did show diverticulosis.  He had a polyp.  I think he had angiodysplasia.  That was all apparently treated.  He has hemoglobin of 7.5.  He clearly is going to need to be transfused.  He is not able to mount much of an erythropoietic response because of his renal insufficiency.  He is quite weak.  He has had no cough.  He has had no fever.  He has had no nausea or vomiting.  His last iron studies back in December showed a ferritin of 37 with iron saturation of 26%.  Currently, I would say his performance status is probably ECOG 2.     Medications:  Allergies as of 01/08/2022       Reactions   Atorvastatin Other (See Comments)   REACTION: Muscle weakness   Nsaids Other (See Comments)   Avoid with current kidney function.     Colesevelam Other (See Comments)   REACTION: joint pain   Pravastatin Other (See Comments)   Muscle aches   Simvastatin Other (See Comments)   REACTION: Myalgias        Medication List        Accurate as of January 08, 2022  8:56 AM. If you have any questions, ask your nurse or doctor.          albuterol 108 (90 Base) MCG/ACT inhaler Commonly known as: VENTOLIN HFA Inhale 2 puffs into the lungs every 6 (six) hours as needed for wheezing or shortness of breath.    amLODipine 5 MG tablet Commonly known as: NORVASC Take 5 mg by mouth daily.   apixaban 5 MG Tabs tablet Commonly known as: Eliquis Take 1 tablet (5 mg total) by mouth 2 (two) times daily.   colchicine 0.6 MG tablet 2 tabs po on Day 1 of gout flare, then 1 tab QD until gout flare is over.   furosemide 20 MG tablet Commonly known as: LASIX Take 20 mg by mouth daily.   gabapentin 300 MG capsule Commonly known as: NEURONTIN Take 600 mg by mouth 2 (two) times daily.   IRON SUPPLEMENT PO Take 1 tablet by mouth daily.   isosorbide mononitrate 30 MG 24 hr tablet Commonly known as: IMDUR Take 0.5 tablets (15 mg total) by mouth daily.   levothyroxine 88 MCG tablet Commonly known as: SYNTHROID Take 1 tablet by mouth once daily   meclizine 25 MG tablet Commonly known as: ANTIVERT TAKE 1 TABLET BY MOUTH THREE TIMES DAILY AS NEEDED FOR DIZZINESS   niacin 500 MG CR tablet Commonly known as: NIASPAN Take 500 mg by mouth daily.   nitroGLYCERIN 0.4 MG SL tablet Commonly known as: NITROSTAT DISSOLVE 1 TABLET UNDER THE TONGUE EVERY 5 MINUTES AS NEEDED FOR CHEST PAIN   omeprazole 40 MG capsule Commonly known as: PRILOSEC  Take 1 capsule by mouth once daily   Refresh Celluvisc 1 % Gel Generic drug: Carboxymethylcellulose Sod PF Apply 1 drop to eye 3 (three) times daily.   telmisartan 20 MG tablet Commonly known as: MICARDIS Take 1 tablet (20 mg total) by mouth daily.   timolol 0.5 % ophthalmic solution Commonly known as: TIMOPTIC Place 1 drop into both eyes daily.   TYLENOL PO Take by mouth daily as needed.   vitamin B-12 1000 MCG tablet Commonly known as: CYANOCOBALAMIN Take 1 tablet by mouth daily.   Xelpros 0.005 % Emul Generic drug: Latanoprost Apply 1 drop to eye at bedtime.        Allergies:  Allergies  Allergen Reactions   Atorvastatin Other (See Comments)    REACTION: Muscle weakness   Nsaids Other (See Comments)    Avoid with current kidney function.      Colesevelam Other (See Comments)    REACTION: joint pain   Pravastatin Other (See Comments)    Muscle aches   Simvastatin Other (See Comments)    REACTION: Myalgias    Past Medical History, Surgical history, Social history, and Family History were reviewed and updated.  Review of Systems: Review of Systems  Constitutional:  Positive for malaise/fatigue.  HENT: Negative.    Eyes: Negative.   Respiratory:  Positive for shortness of breath.   Cardiovascular:  Positive for palpitations and leg swelling.  Gastrointestinal:  Positive for nausea.  Genitourinary: Negative.   Musculoskeletal:  Positive for joint pain and myalgias.  Skin: Negative.   Neurological: Negative.   Endo/Heme/Allergies: Negative.   Psychiatric/Behavioral: Negative.      Physical Exam:  weight is 169 lb (76.7 kg). His oral temperature is 97.8 F (36.6 C). His blood pressure is 98/38 (abnormal) and his pulse is 59 (abnormal). His respiration is 17 and oxygen saturation is 100%.   Wt Readings from Last 3 Encounters:  01/08/22 169 lb (76.7 kg)  10/20/21 170 lb 1.9 oz (77.2 kg)  09/29/21 167 lb 12.8 oz (76.1 kg)    Physical Exam Vitals reviewed.  HENT:     Head: Normocephalic and atraumatic.  Eyes:     Pupils: Pupils are equal, round, and reactive to light.  Cardiovascular:     Rate and Rhythm: Normal rate and regular rhythm.     Heart sounds: Normal heart sounds.  Pulmonary:     Effort: Pulmonary effort is normal.     Breath sounds: Normal breath sounds.  Abdominal:     General: Bowel sounds are normal.     Palpations: Abdomen is soft.  Musculoskeletal:        General: No tenderness or deformity. Normal range of motion.     Cervical back: Normal range of motion.  Lymphadenopathy:     Cervical: No cervical adenopathy.  Skin:    General: Skin is warm and dry.     Findings: No erythema or rash.  Neurological:     Mental Status: He is alert and oriented to person, place, and time.   Psychiatric:        Behavior: Behavior normal.        Thought Content: Thought content normal.        Judgment: Judgment normal.     Lab Results  Component Value Date   WBC 1.8 (L) 01/08/2022   HGB 7.5 (L) 01/08/2022   HCT 23.5 (L) 01/08/2022   MCV 108.8 (H) 01/08/2022   PLT 141 (L) 01/08/2022   Lab Results  Component Value  Date   FERRITIN 37 10/20/2021   IRON 87 10/20/2021   TIBC 339 10/20/2021   UIBC 252 10/20/2021   IRONPCTSAT 26 10/20/2021   Lab Results  Component Value Date   RETICCTPCT 2.8 10/20/2021   RBC 2.16 (L) 01/08/2022   Lab Results  Component Value Date   KPAFRELGTCHN 64.7 (H) 06/27/2020   LAMBDASER 46.3 (H) 06/27/2020   KAPLAMBRATIO 1.40 06/27/2020   Lab Results  Component Value Date   IGGSERUM 649 06/27/2020   IGA 223 06/27/2020   IGMSERUM 140 06/27/2020   Lab Results  Component Value Date   TOTALPROTELP 6.3 06/27/2020     Chemistry      Component Value Date/Time   NA 142 01/05/2022 0000   NA 142 01/05/2017 0000   K 5.6 (H) 01/05/2022 0000   CL 111 (H) 01/05/2022 0000   CO2 24 01/05/2022 0000   BUN 31 (H) 01/05/2022 0000   BUN 24 (A) 01/05/2017 0000   CREATININE 2.33 (H) 01/05/2022 0000   GLU 95 01/05/2017 0000      Component Value Date/Time   CALCIUM 9.1 01/05/2022 0000   CALCIUM 9.5 09/19/2014 0000   ALKPHOS 110 10/20/2021 0847   AST 13 01/05/2022 0000   AST 10 (L) 10/20/2021 0847   ALT 16 01/05/2022 0000   ALT 10 10/20/2021 0847   BILITOT 0.8 01/05/2022 0000   BILITOT 0.4 10/20/2021 0847       Impression and Plan: Roberto Fields is a very pleasant 76 yo caucasian gentleman with multifactorial anemia as well as history of colon cancer treated with resection in November 2020.  Thankfully, there is GI bleed is nothing related to the colon cancer that had back 2 years ago.  I am grateful for this.  Again, we will give him 2 units of blood today.  I think he really would benefit from this.  He has renal insufficiency so he will  get Aranesp today.  We will have to see what his iron studies look like.  I know it is hard for he and his wife to get to our office.  It would be nice if we could do everything today but according to Medicare rules, I do not think we were able to do Aranesp and iron the same day.  At least, we are giving him the blood.  This will help make him feel a whole lot better.  I would like to get him back probably at in a month just for follow-up.  Everything starts to stabilize, then we can move his appointments out.    [name], MD 3/2/20238:56 AM

## 2022-01-08 NOTE — Patient Instructions (Signed)
Darbepoetin Alfa injection What is this medication? DARBEPOETIN ALFA (dar be POE e tin  AL fa) helps your body make more red blood cells. It is used to treat anemia caused by chronic kidney failure and chemotherapy. This medicine may be used for other purposes; ask your health care provider or pharmacist if you have questions. COMMON BRAND NAME(S): Aranesp What should I tell my care team before I take this medication? They need to know if you have any of these conditions: blood clotting disorders or history of blood clots cancer patient not on chemotherapy cystic fibrosis heart disease, such as angina, heart failure, or a history of a heart attack hemoglobin level of 12 g/dL or greater high blood pressure low levels of folate, iron, or vitamin B12 seizures an unusual or allergic reaction to darbepoetin, erythropoietin, albumin, hamster proteins, latex, other medicines, foods, dyes, or preservatives pregnant or trying to get pregnant breast-feeding How should I use this medication? This medicine is for injection into a vein or under the skin. It is usually given by a health care professional in a hospital or clinic setting. If you get this medicine at home, you will be taught how to prepare and give this medicine. Use exactly as directed. Take your medicine at regular intervals. Do not take your medicine more often than directed. It is important that you put your used needles and syringes in a special sharps container. Do not put them in a trash can. If you do not have a sharps container, call your pharmacist or healthcare provider to get one. A special MedGuide will be given to you by the pharmacist with each prescription and refill. Be sure to read this information carefully each time. Talk to your pediatrician regarding the use of this medicine in children. While this medicine may be used in children as young as 36 month of age for selected conditions, precautions do apply. Overdosage: If  you think you have taken too much of this medicine contact a poison control center or emergency room at once. NOTE: This medicine is only for you. Do not share this medicine with others. What if I miss a dose? If you miss a dose, take it as soon as you can. If it is almost time for your next dose, take only that dose. Do not take double or extra doses. What may interact with this medication? Do not take this medicine with any of the following medications: epoetin alfa This list may not describe all possible interactions. Give your health care provider a list of all the medicines, herbs, non-prescription drugs, or dietary supplements you use. Also tell them if you smoke, drink alcohol, or use illegal drugs. Some items may interact with your medicine. What should I watch for while using this medication? Your condition will be monitored carefully while you are receiving this medicine. You may need blood work done while you are taking this medicine. This medicine may cause a decrease in vitamin B6. You should make sure that you get enough vitamin B6 while you are taking this medicine. Discuss the foods you eat and the vitamins you take with your health care professional. What side effects may I notice from receiving this medication? Side effects that you should report to your doctor or health care professional as soon as possible: allergic reactions like skin rash, itching or hives, swelling of the face, lips, or tongue breathing problems changes in vision chest pain confusion, trouble speaking or understanding feeling faint or lightheaded, falls high blood  pressure muscle aches or pains pain, swelling, warmth in the leg rapid weight gain severe headaches sudden numbness or weakness of the face, arm or leg trouble walking, dizziness, loss of balance or coordination seizures (convulsions) swelling of the ankles, feet, hands unusually weak or tired Side effects that usually do not require  medical attention (report to your doctor or health care professional if they continue or are bothersome): diarrhea fever, chills (flu-like symptoms) headaches nausea, vomiting redness, stinging, or swelling at site where injected This list may not describe all possible side effects. Call your doctor for medical advice about side effects. You may report side effects to FDA at 1-800-FDA-1088. Where should I keep my medication? Keep out of the reach of children. Store in a refrigerator between 2 and 8 degrees C (36 and 46 degrees F). Do not freeze. Do not shake. Throw away any unused portion if using a single-dose vial. Throw away any unused medicine after the expiration date. NOTE: This sheet is a summary. It may not cover all possible information. If you have questions about this medicine, talk to your doctor, pharmacist, or health care provider.  2022 Elsevier/Gold Standard (2017-11-15 00:00:00) Blood Transfusion, Adult A blood transfusion is a procedure in which you receive blood or a type of blood cell (blood component) through an IV. You may need a blood transfusion when your blood level is low. This may result from a bleeding disorder, illness, injury, or surgery. The blood may come from a donor. You may also be able to donate blood for yourself (autologous blood donation) before a planned surgery. The blood given in a transfusion is made up of different blood components. You may receive: Red blood cells. These carry oxygen to the cells in the body. Platelets. These help your blood to clot. Plasma. This is the liquid part of your blood. It carries proteins and other substances throughout the body. White blood cells. These help you fight infections. If you have hemophilia or another clotting disorder, you may also receive other types of blood products. Tell a health care provider about: Any blood disorders you have. Any previous reactions you have had during a blood transfusion. Any  allergies you have. All medicines you are taking, including vitamins, herbs, eye drops, creams, and over-the-counter medicines. Any surgeries you have had. Any medical conditions you have, including any recent fever or cold symptoms. Whether you are pregnant or may be pregnant. What are the risks? Generally, this is a safe procedure. However, problems may occur. The most common problems include: A mild allergic reaction, such as red, swollen areas of skin (hives) and itching. Fever or chills. This may be the body's response to new blood cells received. This may occur during or up to 4 hours after the transfusion. More serious problems may include: Transfusion-associated circulatory overload (TACO), or too much fluid in the lungs. This may cause breathing problems. A serious allergic reaction, such as difficulty breathing or swelling around the face and lips. Transfusion-related acute lung injury (TRALI), which causes breathing difficulty and low oxygen in the blood. This can occur within hours of the transfusion or several days later. Iron overload. This can happen after receiving many blood transfusions over a period of time. Infection or virus being transmitted. This is rare because donated blood is carefully tested before it is given. Hemolytic transfusion reaction. This is rare. It happens when your body's defense system (immune system)tries to attack the new blood cells. Symptoms may include fever, chills, nausea, low blood  pressure, and low back or chest pain. Transfusion-associated graft-versus-host disease (TAGVHD). This is rare. It happens when donated cells attack your body's healthy tissues. What happens before the procedure? Medicines Ask your health care provider about: Changing or stopping your regular medicines. This is especially important if you are taking diabetes medicines or blood thinners. Taking medicines such as aspirin and ibuprofen. These medicines can thin your blood.  Do not take these medicines unless your health care provider tells you to take them. Taking over-the-counter medicines, vitamins, herbs, and supplements. General instructions Follow instructions from your health care provider about eating and drinking restrictions. You will have a blood test to determine your blood type. This is necessary to know what kind of blood your body will accept and to match it to the donor blood. If you are going to have a planned surgery, you may be able to do an autologous blood donation. This may be done in case you need to have a transfusion. You will have your temperature, blood pressure, and pulse monitored before the transfusion. If you have had an allergic reaction to a transfusion in the past, you may be given medicine to help prevent a reaction. This medicine may be given to you by mouth (orally) or through an IV. Set aside time for the blood transfusion. This procedure generally takes 1-4 hours to complete. What happens during the procedure?  An IV will be inserted into one of your veins. The bag of donated blood will be attached to your IV. The blood will then enter through your vein. Your temperature, blood pressure, and pulse will be monitored regularly during the transfusion. This monitoring is done to detect early signs of a transfusion reaction. Tell your nurse right away if you have any of these symptoms during the transfusion: Shortness of breath or trouble breathing. Chest or back pain. Fever or chills. Hives or itching. If you have any signs or symptoms of a reaction, your transfusion will be stopped and you may be given medicine. When the transfusion is complete, your IV will be removed. Pressure may be applied to the IV site for a few minutes. A bandage (dressing)will be applied. The procedure may vary among health care providers and hospitals. What happens after the procedure? Your temperature, blood pressure, pulse, breathing rate, and blood  oxygen level will be monitored until you leave the hospital or clinic. Your blood may be tested to see how you are responding to the transfusion. You may be warmed with fluids or blankets to maintain a normal body temperature. If you receive your blood transfusion in an outpatient setting, you will be told whom to contact to report any reactions. Where to find more information For more information on blood transfusions, visit the American Red Cross: redcross.org Summary A blood transfusion is a procedure in which you receive blood or a type of blood cell (blood component) through an IV. The blood you receive may come from a donor or be donated by yourself (autologous blood donation) before a planned surgery. The blood given in a transfusion is made up of different blood components. You may receive red blood cells, platelets, plasma, or white blood cells depending on the condition treated. Your temperature, blood pressure, and pulse will be monitored before, during, and after the transfusion. After the transfusion, your blood may be tested to see how your body has responded. This information is not intended to replace advice given to you by your health care provider. Make sure you discuss any  questions you have with your health care provider. Document Revised: 08/31/2019 Document Reviewed: 04/20/2019 Elsevier Patient Education  [place].

## 2022-01-09 LAB — TYPE AND SCREEN
ABO/RH(D): O POS
Antibody Screen: NEGATIVE
Unit division: 0
Unit division: 0

## 2022-01-09 LAB — BPAM RBC
Blood Product Expiration Date: 202304032359
Blood Product Expiration Date: 202304032359
ISSUE DATE / TIME: 202303021055
ISSUE DATE / TIME: 202303021055
Unit Type and Rh: 5100
Unit Type and Rh: 5100

## 2022-01-12 ENCOUNTER — Ambulatory Visit: Payer: PPO | Admitting: Hematology & Oncology

## 2022-01-12 ENCOUNTER — Ambulatory Visit: Payer: PPO

## 2022-01-12 ENCOUNTER — Inpatient Hospital Stay: Payer: PPO

## 2022-01-20 NOTE — Progress Notes (Signed)
What is the issue?

## 2022-01-23 ENCOUNTER — Encounter: Payer: Self-pay | Admitting: *Deleted

## 2022-01-26 DIAGNOSIS — N1832 Chronic kidney disease, stage 3b: Secondary | ICD-10-CM | POA: Diagnosis not present

## 2022-01-27 NOTE — Progress Notes (Signed)
Stage of kidney disease is stage IIIb ?

## 2022-02-05 ENCOUNTER — Other Ambulatory Visit: Payer: PPO

## 2022-02-05 ENCOUNTER — Ambulatory Visit: Payer: PPO

## 2022-02-05 ENCOUNTER — Ambulatory Visit: Payer: PPO | Admitting: Hematology & Oncology

## 2022-03-02 ENCOUNTER — Ambulatory Visit (INDEPENDENT_AMBULATORY_CARE_PROVIDER_SITE_OTHER): Payer: PPO | Admitting: Family Medicine

## 2022-03-02 ENCOUNTER — Encounter: Payer: Self-pay | Admitting: Family Medicine

## 2022-03-02 VITALS — BP 123/38 | HR 55 | Ht 68.0 in | Wt 174.0 lb

## 2022-03-02 DIAGNOSIS — N184 Chronic kidney disease, stage 4 (severe): Secondary | ICD-10-CM | POA: Diagnosis not present

## 2022-03-02 DIAGNOSIS — D631 Anemia in chronic kidney disease: Secondary | ICD-10-CM

## 2022-03-02 DIAGNOSIS — M109 Gout, unspecified: Secondary | ICD-10-CM | POA: Diagnosis not present

## 2022-03-02 DIAGNOSIS — E039 Hypothyroidism, unspecified: Secondary | ICD-10-CM | POA: Diagnosis not present

## 2022-03-02 DIAGNOSIS — I10 Essential (primary) hypertension: Secondary | ICD-10-CM | POA: Diagnosis not present

## 2022-03-02 MED ORDER — OMEPRAZOLE 40 MG PO CPDR: 40.0000 mg | DELAYED_RELEASE_CAPSULE | Freq: Every day | ORAL | 3 refills | Status: AC

## 2022-03-02 MED ORDER — COLCHICINE 0.6 MG PO TABS: ORAL_TABLET | ORAL | 0 refills | Status: AC

## 2022-03-02 NOTE — Assessment & Plan Note (Signed)
Followed by hematology 

## 2022-03-02 NOTE — Progress Notes (Signed)
? ?Established Patient Office Visit ? ?Subjective   ?Patient ID: Roberto Fields, male    DOB: October 09, 1946  Age: 76 y.o. MRN: [id number] ? ?Chief Complaint  ?Patient presents with  ? Hypertension  ? ? ?HPI ? ?Hypertension- Pt denies chest pain, SOB, dizziness, or heart palpitations.  Taking meds as directed w/o problems.  Denies medication side effects.   ? ?F/U gout -no flares or exacerbations since I last saw him.  He did complete the colchicine but does not have any refills. ? ?She did follow-up with nephrology recently for his chronic kidney disease.  He is chronically anemic but is fairly stable he is following with hematology oncology as well and has a follow-up scheduled next month. ? ? ? ?ROS ? ?  ?Objective:  ?  ? ?BP (!) 123/38   Pulse (!) 55   Ht '5\' 8"'$  (1.727 m)   Wt 174 lb (78.9 kg)   SpO2 100%   BMI 26.46 kg/m?  ? ? ?Physical Exam ?Constitutional:   ?   Appearance: He is well-developed.  ?HENT:  ?   Head: Normocephalic and atraumatic.  ?Cardiovascular:  ?   Rate and Rhythm: Normal rate and regular rhythm.  ?   Heart sounds: Normal heart sounds.  ?Pulmonary:  ?   Effort: Pulmonary effort is normal.  ?   Breath sounds: Normal breath sounds.  ?Skin: ?   General: Skin is warm and dry.  ?Neurological:  ?   Mental Status: He is alert and oriented to person, place, and time.  ?Psychiatric:     ?   Behavior: Behavior normal.  ? ? ? ?No results found for any visits on 03/02/22. ? ? ? ?The ASCVD Risk score (Arnett DK, et al., 2019) failed to calculate for the following reasons: ?  The patient has a prior MI or stroke diagnosis ? ?  ?Assessment & Plan:  ? ?Problem List Items Addressed This Visit   ? ?  ? Cardiovascular and Mediastinum  ? HYPERTENSION, BENIGN ESSENTIAL  ?  Well controlled. Continue current regimen. Follow up in  50mo ? ?  ?  ?  ? Endocrine  ? Hypothyroidism  ?  Last TSH in January looked great.  We will plan to recheck again in July or August. ? ?  ?  ?  ? Genitourinary  ? Erythropoietin  deficiency anemia (Chronic)  ?  Followed by hematology.  ? ?  ?  ? CKD (chronic kidney disease) stage 4, GFR 15-29 ml/min (HCC)  ?  Blood by nephrology.  He does have anemia secondary to CKD.  Monitoring that as well he has a follow-up next month. ? ?  ?  ?  ? Other  ? Gout  ?  We discussed options.  He really wants to minimize his medications so I think it is reasonable to just mostly work on a low purine diet and just monitor for flares before starting daily allopurinol I did go ahead and refill the colchicine just in case. ? ?  ?  ? Relevant Medications  ? colchicine 0.6 MG tablet  ? Other Relevant Orders  ? Uric acid  ? BASIC METABOLIC PANEL WITH GFR  ? ?Other Visit Diagnoses   ? ? Primary hypertension    -  Primary  ? Relevant Orders  ? Uric acid  ? BASIC METABOLIC PANEL WITH GFR  ? ?  ? ? ?Return in about 6 months (around 09/01/2022) for BP and Gout, check uric acid.  ? ? ?  Beatrice Lecher, MD ? ?

## 2022-03-02 NOTE — Assessment & Plan Note (Signed)
Well controlled. Continue current regimen. Follow up in  6 mo  

## 2022-03-02 NOTE — Patient Instructions (Signed)
Please see handout on gout diet.   ?

## 2022-03-02 NOTE — Assessment & Plan Note (Signed)
Blood by nephrology.  He does have anemia secondary to CKD.  Monitoring that as well he has a follow-up next month. ?

## 2022-03-02 NOTE — Assessment & Plan Note (Signed)
We discussed options.  He really wants to minimize his medications so I think it is reasonable to just mostly work on a low purine diet and just monitor for flares before starting daily allopurinol I did go ahead and refill the colchicine just in case. ?

## 2022-03-02 NOTE — Assessment & Plan Note (Signed)
Last TSH in January looked great.  We will plan to recheck again in July or August. ?

## 2022-03-03 LAB — BASIC METABOLIC PANEL WITH GFR
BUN/Creatinine Ratio: 15 (calc) (ref 6–22)
BUN: 33 mg/dL — ABNORMAL HIGH (ref 7–25)
CO2: 24 mmol/L (ref 20–32)
Calcium: 8.8 mg/dL (ref 8.6–10.3)
Chloride: 110 mmol/L (ref 98–110)
Creat: 2.16 mg/dL — ABNORMAL HIGH (ref 0.70–1.28)
Glucose, Bld: 94 mg/dL (ref 65–99)
Potassium: 4.7 mmol/L (ref 3.5–5.3)
Sodium: 142 mmol/L (ref 135–146)
eGFR: 31 mL/min/{1.73_m2} — ABNORMAL LOW (ref >=60)

## 2022-03-03 LAB — URIC ACID: Uric Acid, Serum: 9.9 mg/dL — ABNORMAL HIGH (ref 4.0–8.0)

## 2022-03-03 NOTE — Progress Notes (Signed)
Call patient: Uric acid is still very high at 9.9.  I gave him a handout about diet for gout.  Really encouraged him to take a look at that and make some changes to see if he can get his numbers down.  When it is this high he is going to be at very high risk for having another flare with his gout.  Kidney function is stable at 2.1.

## 2022-03-10 ENCOUNTER — Inpatient Hospital Stay: Payer: PPO | Attending: Hematology & Oncology

## 2022-03-10 ENCOUNTER — Inpatient Hospital Stay (HOSPITAL_BASED_OUTPATIENT_CLINIC_OR_DEPARTMENT_OTHER): Payer: PPO | Admitting: Hematology & Oncology

## 2022-03-10 ENCOUNTER — Encounter: Payer: Self-pay | Admitting: Hematology & Oncology

## 2022-03-10 ENCOUNTER — Inpatient Hospital Stay: Payer: PPO

## 2022-03-10 VITALS — BP 132/58 | HR 58 | Temp 97.9°F | Resp 17 | Wt 169.1 lb

## 2022-03-10 DIAGNOSIS — D631 Anemia in chronic kidney disease: Secondary | ICD-10-CM | POA: Insufficient documentation

## 2022-03-10 DIAGNOSIS — C187 Malignant neoplasm of sigmoid colon: Secondary | ICD-10-CM

## 2022-03-10 DIAGNOSIS — Z85038 Personal history of other malignant neoplasm of large intestine: Secondary | ICD-10-CM | POA: Insufficient documentation

## 2022-03-10 DIAGNOSIS — D500 Iron deficiency anemia secondary to blood loss (chronic): Secondary | ICD-10-CM | POA: Diagnosis not present

## 2022-03-10 DIAGNOSIS — I480 Paroxysmal atrial fibrillation: Secondary | ICD-10-CM | POA: Insufficient documentation

## 2022-03-10 DIAGNOSIS — Z7901 Long term (current) use of anticoagulants: Secondary | ICD-10-CM | POA: Diagnosis not present

## 2022-03-10 DIAGNOSIS — N1832 Chronic kidney disease, stage 3b: Secondary | ICD-10-CM | POA: Insufficient documentation

## 2022-03-10 LAB — IRON AND IRON BINDING CAPACITY (CC-WL,HP ONLY)
Iron: 119 ug/dL (ref 45–182)
Saturation Ratios: 40 % — ABNORMAL HIGH (ref 17.9–39.5)
TIBC: 301 ug/dL (ref 250–450)
UIBC: 182 ug/dL (ref 117–376)

## 2022-03-10 LAB — RETICULOCYTES
Immature Retic Fract: 18.9 % — ABNORMAL HIGH (ref 2.3–15.9)
RBC.: 2.76 MIL/uL — ABNORMAL LOW (ref 4.22–5.81)
Retic Count, Absolute: 74 10*3/uL (ref 19.0–186.0)
Retic Ct Pct: 2.7 % (ref 0.4–3.1)

## 2022-03-10 LAB — CMP (CANCER CENTER ONLY)
ALT: 6 U/L (ref 0–44)
AST: 8 U/L — ABNORMAL LOW (ref 15–41)
Albumin: 4.2 g/dL (ref 3.5–5.0)
Alkaline Phosphatase: 98 U/L (ref 38–126)
Anion gap: 8 (ref 5–15)
BUN: 37 mg/dL — ABNORMAL HIGH (ref 8–23)
CO2: 25 mmol/L (ref 22–32)
Calcium: 9.4 mg/dL (ref 8.9–10.3)
Chloride: 108 mmol/L (ref 98–111)
Creatinine: 2.13 mg/dL — ABNORMAL HIGH (ref 0.61–1.24)
GFR, Estimated: 31 mL/min — ABNORMAL LOW (ref >60)
Glucose, Bld: 138 mg/dL — ABNORMAL HIGH (ref 70–99)
Potassium: 4.6 mmol/L (ref 3.5–5.1)
Sodium: 141 mmol/L (ref 135–145)
Total Bilirubin: 0.5 mg/dL (ref 0.3–1.2)
Total Protein: 6.8 g/dL (ref 6.5–8.1)

## 2022-03-10 LAB — CBC WITH DIFFERENTIAL (CANCER CENTER ONLY)
Abs Immature Granulocytes: 0.03 10*3/uL (ref 0.00–0.07)
Basophils Absolute: 0 10*3/uL (ref 0.0–0.1)
Basophils Relative: 0 %
Eosinophils Absolute: 0 10*3/uL (ref 0.0–0.5)
Eosinophils Relative: 0 %
HCT: 28.9 % — ABNORMAL LOW (ref 39.0–52.0)
Hemoglobin: 9.4 g/dL — ABNORMAL LOW (ref 13.0–17.0)
Immature Granulocytes: 2 %
Lymphocytes Relative: 36 %
Lymphs Abs: 0.7 10*3/uL (ref 0.7–4.0)
MCH: 33.6 pg (ref 26.0–34.0)
MCHC: 32.5 g/dL (ref 30.0–36.0)
MCV: 103.2 fL — ABNORMAL HIGH (ref 80.0–100.0)
Monocytes Absolute: 0.1 10*3/uL (ref 0.1–1.0)
Monocytes Relative: 4 %
Neutro Abs: 1.1 10*3/uL — ABNORMAL LOW (ref 1.7–7.7)
Neutrophils Relative %: 58 %
Platelet Count: 154 10*3/uL (ref 150–400)
RBC: 2.8 MIL/uL — ABNORMAL LOW (ref 4.22–5.81)
RDW: 15.9 % — ABNORMAL HIGH (ref 11.5–15.5)
Smear Review: NORMAL
WBC Count: 1.9 10*3/uL — ABNORMAL LOW (ref 4.0–10.5)
nRBC: 0 % (ref 0.0–0.2)

## 2022-03-10 LAB — FERRITIN: Ferritin: 127 ng/mL (ref 24–336)

## 2022-03-10 MED ORDER — DARBEPOETIN ALFA 300 MCG/0.6ML IJ SOSY
300.0000 ug | PREFILLED_SYRINGE | Freq: Once | INTRAMUSCULAR | Status: DC
  Filled 2022-03-10: qty 0.6

## 2022-03-10 NOTE — Progress Notes (Signed)
?Hematology and Oncology Follow Up Visit ? ?Roberto Fields ?[id number] ?10-14-1946 76 y.o. ?03/10/2022 ? ? 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  ?Current Therapy:        ?IV iron as indicated ?Aranesp 300 mcg subcu for hemoglobin less than 9 ?pRBC transfusion as needed ?  ?Interim History:  Roberto Fields is here today with his wife for follow-up.  We last saw him back in early March.  At that time, he had some rectal bleeding.  His hemoglobin was 7.5.  He has renal insufficiency.  We did give him some blood.  He did get some Aranesp. ? ?He is feeling better.  He has had no problems with abdominal pain.  Is been no melena or bright red blood per rectum.  He has had no cough or shortness of breath.  He has had no rashes.  There is been no leg swelling. ? ?His last iron studies back in early March showed a ferritin of 239 with an iron saturation of 25%. ? ?His appetite is quite good.  He and his wife had a wonderful Easter holiday with her family. ? ?Overall, I would say his performance status is ECOG 1. ? ?Small ?Medications:  ?Allergies as of 03/10/2022   ? ?   Reactions  ? Atorvastatin Other (See Comments)  ? REACTION: Muscle weakness  ? Nsaids Other (See Comments)  ? Avoid with current kidney function.    ? Colesevelam Other (See Comments)  ? REACTION: joint pain  ? Pravastatin Other (See Comments)  ? Muscle aches  ? Simvastatin Other (See Comments)  ? REACTION: Myalgias  ? ?  ? ?  ?Medication List  ?  ? ?  ? Accurate as of Mar 10, 2022 10:08 AM. If you have any questions, ask your nurse or doctor.  ?  ?  ? ?  ? ?albuterol 108 (90 Base) MCG/ACT inhaler ?Commonly known as: VENTOLIN HFA ?Inhale 2 puffs into the lungs every 6 (six) hours as needed for wheezing or shortness of  breath. ?  ?amLODipine 5 MG tablet ?Commonly known as: NORVASC ?Take 5 mg by mouth daily. ?  ?apixaban 5 MG Tabs tablet ?Commonly known as: Eliquis ?Take 1 tablet (5 mg total) by mouth 2 (two) times daily. ?  ?colchicine 0.6 MG tablet ?2 tabs po on Day 1 of gout flare, then 1 tab QD until gout flare is over. ?  ?furosemide 20 MG tablet ?Commonly known as: LASIX ?Take 20 mg by mouth daily. ?  ?gabapentin 300 MG capsule ?Commonly known as: NEURONTIN ?Take 600 mg by mouth 2 (two) times daily. ?  ?IRON SUPPLEMENT PO ?Take 1 tablet by mouth daily. ?  ?isosorbide mononitrate 30 MG 24 hr tablet ?Commonly known as: IMDUR ?Take 0.5 tablets (15 mg total) by mouth daily. ?  ?levothyroxine 88 MCG tablet ?Commonly known as: SYNTHROID ?Take 1 tablet by mouth once daily ?  ?meclizine 25 MG tablet ?Commonly known as: ANTIVERT ?TAKE 1 TABLET BY MOUTH THREE TIMES DAILY AS NEEDED FOR DIZZINESS ?  ?niacin 500 MG CR tablet ?Commonly known as: NIASPAN ?Take 500 mg by mouth daily. ?  ?nitroGLYCERIN 0.4 MG SL tablet ?Commonly known as: NITROSTAT ?DISSOLVE 1 TABLET UNDER THE TONGUE EVERY 5 MINUTES AS NEEDED FOR CHEST PAIN ?  ?omeprazole 40 MG  capsule ?Commonly known as: PRILOSEC ?Take 1 capsule (40 mg total) by mouth daily. ?  ?Refresh Celluvisc 1 % Gel ?Generic drug: Carboxymethylcellulose Sod PF ?Apply 1 drop to eye 3 (three) times daily. ?  ?telmisartan 20 MG tablet ?Commonly known as: MICARDIS ?Take 1 tablet (20 mg total) by mouth daily. ?  ?timolol 0.5 % ophthalmic solution ?Commonly known as: TIMOPTIC ?Place 1 drop into both eyes daily. ?  ?TYLENOL PO ?Take by mouth daily as needed. ?  ?vitamin B-12 1000 MCG tablet ?Commonly known as: CYANOCOBALAMIN ?Take 1 tablet by mouth daily. ?  ?Xelpros 0.005 % Emul ?Generic drug: Latanoprost ?Apply 1 drop to eye at bedtime. ?  ? ?  ? ? ?Allergies:  ?Allergies  ?Allergen Reactions  ? Atorvastatin Other (See Comments)  ?  REACTION: Muscle weakness  ? Nsaids Other (See Comments)  ?  Avoid with  current kidney function.    ? Colesevelam Other (See Comments)  ?  REACTION: joint pain  ? Pravastatin Other (See Comments)  ?  Muscle aches  ? Simvastatin Other (See Comments)  ?  REACTION: Myalgias  ? ? ?Past Medical History, Surgical history, Social history, and Family History were reviewed and updated. ? ?Review of Systems: ?Review of Systems  ?Constitutional:  Positive for malaise/fatigue.  ?HENT: Negative.    ?Eyes: Negative.   ?Respiratory:  Positive for shortness of breath.   ?Cardiovascular:  Positive for palpitations and leg swelling.  ?Gastrointestinal:  Positive for nausea.  ?Genitourinary: Negative.   ?Musculoskeletal:  Positive for joint pain and myalgias.  ?Skin: Negative.   ?Neurological: Negative.   ?Endo/Heme/Allergies: Negative.   ?Psychiatric/Behavioral: Negative.    ? ? ?Physical Exam: ? weight is 169 lb 1.9 oz (76.7 kg). His oral temperature is 97.9 ?F (36.6 ?C). His blood pressure is 132/58 (abnormal) and his pulse is 58 (abnormal). His respiration is 17 and oxygen saturation is 98%.  ? ?Wt Readings from Last 3 Encounters:  ?03/10/22 169 lb 1.9 oz (76.7 kg)  ?03/02/22 174 lb (78.9 kg)  ?01/08/22 169 lb (76.7 kg)  ? ? ?Physical Exam ?Vitals reviewed.  ?HENT:  ?   Head: Normocephalic and atraumatic.  ?Eyes:  ?   Pupils: Pupils are equal, round, and reactive to light.  ?Cardiovascular:  ?   Rate and Rhythm: Normal rate and regular rhythm.  ?   Heart sounds: Normal heart sounds.  ?Pulmonary:  ?   Effort: Pulmonary effort is normal.  ?   Breath sounds: Normal breath sounds.  ?Abdominal:  ?   General: Bowel sounds are normal.  ?   Palpations: Abdomen is soft.  ?Musculoskeletal:     ?   General: No tenderness or deformity. Normal range of motion.  ?   Cervical back: Normal range of motion.  ?Lymphadenopathy:  ?   Cervical: No cervical adenopathy.  ?Skin: ?   General: Skin is warm and dry.  ?   Findings: No erythema or rash.  ?Neurological:  ?   Mental Status: He is alert and oriented to person,  place, and time.  ?Psychiatric:     ?   Behavior: Behavior normal.     ?   Thought Content: Thought content normal.     ?   Judgment: Judgment normal.  ? ? ? ?Lab Results  ?Component Value Date  ? WBC 1.9 (L) 03/10/2022  ? HGB 9.4 (L) 03/10/2022  ? HCT 28.9 (L) 03/10/2022  ? MCV 103.2 (H) 03/10/2022  ? PLT 154 03/10/2022  ? ?  Lab Results  ?Component Value Date  ? FERRITIN 239 01/08/2022  ? IRON 77 01/08/2022  ? TIBC 312 01/08/2022  ? UIBC 235 01/08/2022  ? IRONPCTSAT 25 01/08/2022  ? ?Lab Results  ?Component Value Date  ? RETICCTPCT 2.7 03/10/2022  ? RBC 2.76 (L) 03/10/2022  ? ?Lab Results  ?Component Value Date  ? KPAFRELGTCHN 64.7 (H) 06/27/2020  ? LAMBDASER 46.3 (H) 06/27/2020  ? KAPLAMBRATIO 1.40 06/27/2020  ? ?Lab Results  ?Component Value Date  ? IGGSERUM 649 06/27/2020  ? IGA 223 06/27/2020  ? IGMSERUM 140 06/27/2020  ? ?Lab Results  ?Component Value Date  ? TOTALPROTELP 6.3 06/27/2020  ? ?  Chemistry   ?   ?Component Value Date/Time  ? NA 141 03/10/2022 0850  ? NA 142 01/05/2017 0000  ? K 4.6 03/10/2022 0850  ? CL 108 03/10/2022 0850  ? CO2 25 03/10/2022 0850  ? BUN 37 (H) 03/10/2022 0850  ? BUN 24 (A) 01/05/2017 0000  ? CREATININE 2.13 (H) 03/10/2022 0850  ? CREATININE 2.16 (H) 03/02/2022 0000  ? GLU 95 01/05/2017 0000  ?    ?Component Value Date/Time  ? CALCIUM 9.4 03/10/2022 0850  ? CALCIUM 9.5 09/19/2014 0000  ? ALKPHOS 98 03/10/2022 0850  ? AST 8 (L) 03/10/2022 0850  ? ALT 6 03/10/2022 0850  ? BILITOT 0.5 03/10/2022 0850  ?  ? ? ? ?Impression and Plan: Roberto Fields is a very pleasant 76 yo caucasian gentleman with multifactorial anemia as well as history of colon cancer treated with resection in November 2020. ? ?I am glad that his hemoglobin is better.  He will always have some element of anemia.  We are not doing Aranesp unless his hemoglobin gets below 9. ? ?His birthday is [birth year].  I am sure he will have a wonderful birthday. ? ?At this point, we will plan to get him back in about 3 months.  I know  it is very difficult for him to get to our office.  They will certainly call us sooner if there is a problem. ? ?I have noted that his white cell count is quite low.  Again not sure as to why this is quite low.  He

## 2022-03-16 DIAGNOSIS — H401133 Primary open-angle glaucoma, bilateral, severe stage: Secondary | ICD-10-CM | POA: Diagnosis not present

## 2022-03-19 ENCOUNTER — Ambulatory Visit (HOSPITAL_COMMUNITY): Payer: PPO | Attending: Cardiology

## 2022-03-19 DIAGNOSIS — I359 Nonrheumatic aortic valve disorder, unspecified: Secondary | ICD-10-CM | POA: Diagnosis not present

## 2022-03-19 LAB — ECHOCARDIOGRAM COMPLETE
Area-P 1/2: 4.68 cm2
MV M vel: 3.92 m/s
MV Peak grad: 61.5 mmHg
P 1/2 time: 368 msec
S' Lateral: 3.3 cm

## 2022-03-20 ENCOUNTER — Encounter: Payer: Self-pay | Admitting: *Deleted

## 2022-03-31 DIAGNOSIS — D631 Anemia in chronic kidney disease: Secondary | ICD-10-CM | POA: Diagnosis not present

## 2022-03-31 DIAGNOSIS — I209 Angina pectoris, unspecified: Secondary | ICD-10-CM | POA: Diagnosis not present

## 2022-03-31 DIAGNOSIS — N184 Chronic kidney disease, stage 4 (severe): Secondary | ICD-10-CM | POA: Diagnosis not present

## 2022-03-31 DIAGNOSIS — Z87891 Personal history of nicotine dependence: Secondary | ICD-10-CM | POA: Diagnosis not present

## 2022-03-31 DIAGNOSIS — D6869 Other thrombophilia: Secondary | ICD-10-CM | POA: Diagnosis not present

## 2022-03-31 DIAGNOSIS — Z7901 Long term (current) use of anticoagulants: Secondary | ICD-10-CM | POA: Diagnosis not present

## 2022-03-31 DIAGNOSIS — J449 Chronic obstructive pulmonary disease, unspecified: Secondary | ICD-10-CM | POA: Diagnosis not present

## 2022-03-31 DIAGNOSIS — I480 Paroxysmal atrial fibrillation: Secondary | ICD-10-CM | POA: Diagnosis not present

## 2022-04-08 NOTE — Progress Notes (Signed)
HPI: FU CAD and PAF. Previously cared for at [facility]. Catheterization 2013 showed a 99% obtuse marginal and 70% PDA. Patient had PCI of his marginal. Note based on records his disease was predominantly distal and not amenable to revascularization. Carotid Doppler September 2014 showed 1-39% bilateral stenosis. Patient also with history of atrial fibrillation. Nuclear study September 2017 showed mild inferolateral ischemia and ejection fraction 52%. Study felt to be low risk and we are treating medically. Renal ultrasound 2016 showed no aneurysm.  Monitor May 02, 2019 showed sinus bradycardia with PACs, PVCs and brief PAT.  Heart rate as low as 37.  Amiodarone discontinued. Monitor July 2022 shows sinus bradycardia, normal sinus rhythm, sinus tachycardia, occasional PACs, nonsustained ventricular tachycardia.  Beta-blocker not added due to history of bradycardia.  Echocardiogram May 2023 showed normal LV function, moderate left atrial enlargement, mild mitral regurgitation, moderate to severe aortic insufficiency.  Since last seen, patient does have dyspnea on exertion but no orthopnea, PND, pedal edema, chest pain or syncope.  He has had difficulties with anemia recently and is required transfusion.  Current Outpatient Medications  Medication Sig Dispense Refill   Acetaminophen (TYLENOL PO) Take by mouth daily as needed.      albuterol (VENTOLIN HFA) 108 (90 Base) MCG/ACT inhaler Inhale 2 puffs into the lungs every 6 (six) hours as needed for wheezing or shortness of breath. 8 g 0   amLODipine (NORVASC) 5 MG tablet Take 5 mg by mouth daily.     apixaban (ELIQUIS) 5 MG TABS tablet Take 1 tablet (5 mg total) by mouth 2 (two) times daily. 180 tablet 3   cephALEXin (KEFLEX) 500 MG capsule Take 1 capsule (500 mg total) by mouth 4 (four) times daily for 7 days. 28 capsule 0   colchicine 0.6 MG tablet 2 tabs po on Day 1 of gout flare, then 1 tab QD until gout flare is over. 30 tablet 0    Ferrous Sulfate (IRON SUPPLEMENT PO) Take 1 tablet by mouth daily.     furosemide (LASIX) 20 MG tablet Take 20 mg by mouth daily.     gabapentin (NEURONTIN) 300 MG capsule Take 600 mg by mouth 2 (two) times daily.      Latanoprost (XELPROS) 0.005 % EMUL Apply 1 drop to eye at bedtime.     levothyroxine (SYNTHROID) 88 MCG tablet Take 1 tablet by mouth once daily 90 tablet 3   meclizine (ANTIVERT) 25 MG tablet TAKE 1 TABLET BY MOUTH THREE TIMES DAILY AS NEEDED FOR DIZZINESS 90 tablet 0   niacin (NIASPAN) 500 MG CR tablet Take 500 mg by mouth daily.     nitroGLYCERIN (NITROSTAT) 0.4 MG SL tablet DISSOLVE 1 TABLET UNDER THE TONGUE EVERY 5 MINUTES AS NEEDED FOR CHEST PAIN 100 tablet 0   omeprazole (PRILOSEC) 40 MG capsule Take 1 capsule (40 mg total) by mouth daily. 90 capsule 3   REFRESH CELLUVISC 1 % GEL Apply 1 drop to eye 3 (three) times daily.      telmisartan (MICARDIS) 20 MG tablet Take 1 tablet (20 mg total) by mouth daily. 90 tablet 3   timolol (TIMOPTIC) 0.5 % ophthalmic solution Place 1 drop into both eyes daily.      vitamin B-12 (CYANOCOBALAMIN) 1000 MCG tablet Take 1 tablet by mouth daily.     isosorbide mononitrate (IMDUR) 30 MG 24 hr tablet Take 0.5 tablets (15 mg total) by mouth daily. 45 tablet 3   No current facility-administered medications  for this visit.     Past Medical History:  Diagnosis Date   Arthritis    Atrial fibrillation (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 Dr Unk Lightning removed a larger polyp   CORONARY STENT PLACEMENT  04/2011   LAD, [facility]   ESOPHAGOGASTRODUODENOSCOPY     dr Lavina Hamman in [place] and 1989   TRANSURETHRAL RESECTION OF PROSTATE      Social History   Socioeconomic History   Marital status: Married    Spouse name: Drexel Iha   Number of children: 3   Years of education: 14   Highest education level: Associate degree: academic program  Occupational History   Occupation: Retired  Tobacco Use   Smoking status: Former    Types: Cigarettes    Quit date: 10/02/1984    Years since quitting: 37.5   Smokeless tobacco: Never  Vaping Use   Vaping Use: Never used  Substance and Sexual Activity   Alcohol use: No   Drug use: No   Sexual activity: Not on file  Other Topics Concern   Not on file  Social History Narrative   Lives with his wife. He had three children but one has deceased. He enjoys watching televison.   Social Determinants of Health   Financial Resource Strain: Low Risk    Difficulty of Paying Living Expenses: Not hard at all  Food Insecurity: No Food Insecurity   Worried About Charity fundraiser in the Last Year: Never true   [place] in the Last Year: Never true  Transportation Needs: No Transportation Needs   Lack of Transportation (Medical): No   Lack of Transportation (Non-Medical): No  Physical Activity: Inactive   Days of Exercise per Week: 0 days   Minutes of Exercise per Session: 0 min  Stress: No Stress Concern Present   Feeling of Stress : Not at all  Social Connections: Moderately Integrated   Frequency of  Communication with Friends and Family: Once a week   Frequency of Social Gatherings with Friends and Family: Never   Attends Religious Services: More than 4 times per year   Active Member of [employer] or Organizations: Yes   Attends Music therapist: More than 4 times per year   Marital Status: Married  Human resources officer Violence: Not At Risk   Fear of Current  or Ex-Partner: No   Emotionally Abused: No   Physically Abused: No   Sexually Abused: No    Family History  Problem Relation Age of Onset   Heart disease Father    Heart attack Father    Coronary artery disease Brother    Heart attack Brother 38       pacemaker and defibrillator   Coronary artery disease Brother    Leukemia Mother    Hypertension Other        family history of   Heart disease Sister    Stomach cancer Sister    Breast cancer Sister    Lung cancer Sister    Breast cancer Sister    Lung cancer Sister    Colon cancer Neg Hx    Esophageal cancer Neg Hx    Rectal cancer Neg Hx     ROS: no fevers or chills, productive cough, hemoptysis, dysphasia, odynophagia, melena, hematochezia, dysuria, hematuria, rash, seizure activity, orthopnea, PND, pedal edema, claudication. Remaining systems are negative.  Physical Exam: Well-developed well-nourished in no acute distress.  Skin is warm and dry.  HEENT is normal.  Neck is supple.  Chest is clear to auscultation with normal expansion.  Cardiovascular exam is regular rate and rhythm.  Abdominal exam nontender or distended. No masses palpated. Extremities show no edema. neuro grossly intact   A/P  1 aortic insufficiency-patient with moderate to severe aortic insufficiency on recent echocardiogram.  However it is not clear to me that he is symptomatic.  He does have dyspnea on exertion but has a history of COPD and also recent worsening anemia.  He is not volume overloaded on examination.  I think we should follow this for now.  I will see him back in 6  months to make sure his symptoms are stable.  We will plan to repeat his echocardiogram May 2024 or sooner if necessary.  2 paroxysmal atrial fibrillation-he is in sinus rhythm on exam.  Note amiodarone was discontinued previously due to bradycardia.  Continue apixaban.  3 history of bradycardia-improved after discontinuing amiodarone.  Continue to follow.  4 hypertension-blood pressure controlled.  Continue present medications and follow-up.  5 hyperlipidemia-patient is intolerant to statins and Zetia and declined Repatha previously.  6 coronary artery disease-intolerant to statins.  Patient is not on aspirin given need for apixaban.  7 chronic stage III kidney disease  8 neutropenia/anemia-patient is followed by Dr. Marin Olp.  His anemia is felt secondary to renal insufficiency and GI blood loss.  He did have polyps removed yesterday.  Kirk Ruths, MD

## 2022-04-09 ENCOUNTER — Telehealth: Payer: Self-pay | Admitting: Cardiology

## 2022-04-09 NOTE — Telephone Encounter (Signed)
Clinical pharmacist to review Eliquis 

## 2022-04-09 NOTE — Telephone Encounter (Signed)
Spoke with pt's wife regarding feelings of weakness and fatigue. Pt is scheduled to have a colonoscopy on Tues 6/6 and clearance was entered, however wife was reading information packet and noticed that most anticoagulation is stopped about a week prior to procedure so she had him stop taking his eliquis on 5/29. Explained to wife that eliquis is usually stopped 2 days prior to procedure, wife questions if he should restart for a few days?  Per review of chart pt has recently been dealing with low hemoglobin, however most recent numbers done 5/2 showed an H/H of 9.4/28.9 and improvement from 7.5/23.5. Suggested that they reach out to Dr. Margurite Auerbach in regards to weakness and fatigue. Wife agrees with this plan however wife is also questioning if pt should be going through with colonoscopy procedure at this time because of he weak he is and he will be required to not eat for over 24 hours prior to the procedure. Wife would like for Dr. [name] to advise. Pt is schedule for office visit with Dr. [name] on 04/15/22 at the [place] office.

## 2022-04-09 NOTE — Telephone Encounter (Signed)
STAT if patient feels like he/she is going to faint   Are you dizzy now? no  Do you feel faint or have you passed out? no  Do you have any other symptoms? fatigue  Have you checked your HR and BP (record if available)?   Patient just feels weak and fatigued. He does have some vertigo that started yesterday that seemed to resolve itself

## 2022-04-09 NOTE — Telephone Encounter (Signed)
   Pre-operative Risk Assessment    Patient Name: Roberto Fields  DOB: [birth year] MRN: [id number]      Request for Surgical Clearance    Procedure:   Colonoscopy  Date of Surgery:  Clearance 04/14/22                                 Surgeon:  Dr. Colon Branch Surgeon's Group or Practice Name:  [facility]  Phone number:  [phone] Fax number:  [phone]   Type of Clearance Requested:   - Pharmacy:  Hold Apixaban (Eliquis) two days prior to procedure , Patient was already off of it for a few days, GI advised him to go back on it.    Type of Anesthesia:  General    Additional requests/questions:      Marquita Palms   04/09/2022, 11:59 AM

## 2022-04-10 ENCOUNTER — Ambulatory Visit (INDEPENDENT_AMBULATORY_CARE_PROVIDER_SITE_OTHER): Payer: PPO | Admitting: Family Medicine

## 2022-04-10 ENCOUNTER — Encounter: Payer: Self-pay | Admitting: Family Medicine

## 2022-04-10 VITALS — BP 127/53 | HR 56 | Temp 97.9°F | Ht 68.0 in | Wt 166.0 lb

## 2022-04-10 DIAGNOSIS — D500 Iron deficiency anemia secondary to blood loss (chronic): Secondary | ICD-10-CM | POA: Diagnosis not present

## 2022-04-10 DIAGNOSIS — S51812A Laceration without foreign body of left forearm, initial encounter: Secondary | ICD-10-CM

## 2022-04-10 LAB — CBC WITH DIFFERENTIAL/PLATELET
Absolute Monocytes: 148 cells/uL — ABNORMAL LOW (ref 200–950)
Basophils Absolute: 0 cells/uL (ref 0–200)
Basophils Relative: 0 %
Eosinophils Absolute: 0 cells/uL — ABNORMAL LOW (ref 15–500)
Eosinophils Relative: 0 %
HCT: 23.3 % — ABNORMAL LOW (ref 38.5–50.0)
Hemoglobin: 7.6 g/dL — ABNORMAL LOW (ref 13.2–17.1)
Lymphs Abs: 679 cells/uL — ABNORMAL LOW (ref 850–3900)
MCH: 33.9 pg — ABNORMAL HIGH (ref 27.0–33.0)
MCHC: 32.6 g/dL (ref 32.0–36.0)
MCV: 104 fL — ABNORMAL HIGH (ref 80.0–100.0)
Monocytes Relative: 5.7 %
Neutro Abs: 1773 cells/uL (ref 1500–7800)
Neutrophils Relative %: 68.2 %
RBC: 2.24 10*6/uL — ABNORMAL LOW (ref 4.20–5.80)
RDW: 14.8 % (ref 11.0–15.0)
Total Lymphocyte: 26.1 %
WBC: 2.6 10*3/uL — ABNORMAL LOW (ref 3.8–10.8)

## 2022-04-10 MED ORDER — CEPHALEXIN 500 MG PO CAPS: 500.0000 mg | ORAL_CAPSULE | Freq: Four times a day (QID) | ORAL | 0 refills | Status: DC

## 2022-04-10 NOTE — Patient Instructions (Signed)
Change dressing in 3-4 days.  Let us know if having more pain, redness or pus like drainage.    Dressing:  Apply vaseline gauze, non adherent pad, wrap in roll gauze, tape gauze.   Start cephalexin (antibiotic).    Follow up in 1 week.

## 2022-04-10 NOTE — Telephone Encounter (Signed)
Patient with diagnosis of afib on Eliquis for anticoagulation.    Procedure: colonoscopy Date of procedure: 04/14/22  CHA2DS2-VASc Score = 6   This indicates a 9.7% annual risk of stroke. The patient's score is based upon: CHF History: 0 HTN History: 1 Diabetes History: 0 Stroke History: 2 Vascular Disease History: 1 Age Score: 2 Gender Score: 0      CrCl 27.6  Per office protocol, patient can hold Eliquis for 2 days prior to procedure.    Patient should resume Eliquis as soon as safely possible especially with his history of CVA.

## 2022-04-10 NOTE — Telephone Encounter (Signed)
   Patient Name: Roberto Fields  DOB: 02/17/1946 MRN: [id number]  Primary Cardiologist: Kirk Ruths, MD  Chart reviewed as part of pre-operative protocol coverage. Per office protocol, patient can hold Eliquis for 2 days prior to procedure.     Patient should resume Eliquis as soon as safely possible especially with his history of CVA.   Mable Fill, Marissa Nestle, NP 04/10/2022, 2:49 PM

## 2022-04-12 DIAGNOSIS — S51819A Laceration without foreign body of unspecified forearm, initial encounter: Secondary | ICD-10-CM | POA: Insufficient documentation

## 2022-04-12 NOTE — Progress Notes (Signed)
Roberto Fields - 76 y.o. male MRN [id number]  Date of birth: 01/13/1946  Subjective Chief Complaint  Patient presents with   Open Wound    HPI Roberto Fields is a 76 year old male here today for wound.  Reports that he hit his arm on his trailer 2 days ago resulting in wound to the left arm.  They did not wash the wound out because they were afraid of getting it wet.  They have just been dabbing blood that has been oozing from the area.  He does have some pain and has noted some increased redness around the area.  ROS:  A comprehensive ROS was completed and negative except as noted per HPI  Allergies  Allergen Reactions   Atorvastatin Other (See Comments)    REACTION: Muscle weakness   Nsaids Other (See Comments)    Avoid with current kidney function.     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. Dr Unk Lightning removed a larger polyp   CORONARY STENT PLACEMENT  04/2011    LAD, High [facility]   ESOPHAGOGASTRODUODENOSCOPY     dr Lavina Hamman in [place] and 1989   TRANSURETHRAL RESECTION OF PROSTATE      Social History   Socioeconomic History   Marital status: Married    Spouse name: Drexel Iha   Number of children: 3   Years of education: 14   Highest education level: Associate degree: academic program  Occupational History   Occupation: Retired  Tobacco Use   Smoking status: Former    Types: Cigarettes    Quit date: 10/02/1984    Years since quitting: 37.5   Smokeless tobacco: Never  Vaping Use   Vaping Use: Never used  Substance and Sexual Activity   Alcohol use: No   Drug use: No   Sexual activity: Not on file  Other Topics Concern   Not on file  Social History Narrative   Lives with his wife. He had three children but one has deceased. He enjoys watching televison.   Social Determinants of Health   Financial Resource Strain:  Low Risk    Difficulty of Paying Living Expenses: Not hard at all  Food Insecurity: No Food Insecurity   Worried About Running Out of Food in the Last Year: Never true   Ran Out of Food in the Last Year: Never true  Transportation Needs: No Transportation Needs   Lack of Transportation (Medical): No   Lack of Transportation (Non-Medical): No  Physical Activity: Inactive   Days of Exercise per Week: 0 days   Minutes of Exercise per Session: 0 min  Stress: No Stress Concern Present   Feeling of Stress : Not at all  Social Connections: Moderately Integrated   Frequency of Communication with Friends and Family: Once a week   Frequency of Social Gatherings with Friends and Family: Never   Attends Religious Services: More than 4 times per year   Active Member of [employer] or Organizations: Yes   Attends Music therapist: More than 4 times per year   Marital Status: Married    Family History  Problem Relation Age of  Onset   Heart disease Father    Heart attack Father    Coronary artery disease Brother    Heart attack Brother 7       pacemaker and defibrillator   Coronary artery disease Brother    Leukemia Mother    Hypertension Other        family history of   Heart disease Sister    Stomach cancer Sister    Breast cancer Sister    Lung cancer Sister    Breast cancer Sister    Lung cancer Sister    Colon cancer Neg Hx    Esophageal cancer Neg Hx    Rectal cancer Neg Hx     Health Maintenance  Topic Date Due   Zoster Vaccines- Shingrix (1 of 2) Never done   COVID-19 Vaccine (3 - Moderna risk series) 06/18/2021   INFLUENZA VACCINE  06/09/2022   TETANUS/TDAP  02/15/2024   COLONOSCOPY (Pts 45-64yr Insurance coverage will need to be confirmed)  12/30/2024   Pneumonia Vaccine 76 Years old  Completed   Hepatitis C Screening  Completed   HPV VACCINES  Aged Out     ----------------------------------------------------------------------------------------------------------------------------------------------------------------------------------------------------------------- Physical Exam BP (!) 127/53 (BP Location: Right Arm, Patient Position: Sitting, Cuff Size: Normal)   Pulse (!) 56   Temp 97.9 F (36.6 C)   Ht '5\' 8"'$  (1.727 m)   Wt 166 lb (75.3 kg)   SpO2 99%   BMI 25.24 kg/m   Physical Exam Constitutional:      Appearance: Normal appearance.  Skin:    Comments: Skin tear located on the left forearm.  There is some blood oozing from wound base.  This surrounded by some bruising and mild erythema.  Neurological:     Mental Status: He is alert.    ------------------------------------------------------------------------------------------------------------------------------------------------------------------------------------------------------------------- Assessment and Plan  Skin tear of forearm without complication Wound was thoroughly scrubbed with iodine and Hibiclens.   Wound then thoroughly flushed with saline.  Tried to reapproximate tissue flap however approximate age was already starting to reattach and he did not tolerate this.  I left this in place for now.  Will probably need some debridement at follow-up.  I did cover with Xeroform gauze and nonadherent pad.  Wrapped with gauze roll.  Recommend changing dressing in 3 to 4 days.  I will plan to follow-up with him in 1 week.  Starting Keflex as well.  Tetanus is up-to-date.   Meds ordered this  encounter  Medications   cephALEXin (KEFLEX) 500 MG capsule    Sig: Take 1 capsule (500 mg total) by mouth 4 (four) times daily for 7 days.    Dispense:  28 capsule    Refill:  0    Return in about 1 week (around 04/17/2022) for wound check.    This visit occurred during the SARS-CoV-2 public health emergency.  Safety protocols were in place, including screening questions prior to the visit, additional usage of staff PPE, and extensive cleaning of exam room while observing appropriate contact time as indicated for disinfecting solutions.

## 2022-04-12 NOTE — Assessment & Plan Note (Signed)
Wound was thoroughly scrubbed with iodine and Hibiclens.  Wound then thoroughly flushed with saline.  Tried to reapproximate tissue flap however approximate age was already starting to reattach and he did not tolerate this.  I left this in place for now.  Will probably need some debridement at follow-up.  I did cover with Xeroform gauze and nonadherent pad.  Wrapped with gauze roll.  Recommend changing dressing in 3 to 4 days.  I will plan to follow-up with him in 1 week.  Starting Keflex as well.  Tetanus is up-to-date.

## 2022-04-14 DIAGNOSIS — Z85038 Personal history of other malignant neoplasm of large intestine: Secondary | ICD-10-CM | POA: Diagnosis not present

## 2022-04-14 DIAGNOSIS — I69311 Memory deficit following cerebral infarction: Secondary | ICD-10-CM | POA: Diagnosis not present

## 2022-04-14 DIAGNOSIS — Z86010 Personal history of colonic polyps: Secondary | ICD-10-CM | POA: Diagnosis not present

## 2022-04-14 DIAGNOSIS — Z955 Presence of coronary angioplasty implant and graft: Secondary | ICD-10-CM | POA: Diagnosis not present

## 2022-04-14 DIAGNOSIS — D120 Benign neoplasm of cecum: Secondary | ICD-10-CM | POA: Diagnosis not present

## 2022-04-14 DIAGNOSIS — K648 Other hemorrhoids: Secondary | ICD-10-CM | POA: Diagnosis not present

## 2022-04-14 DIAGNOSIS — Z980 Intestinal bypass and anastomosis status: Secondary | ICD-10-CM | POA: Diagnosis not present

## 2022-04-14 DIAGNOSIS — I2510 Atherosclerotic heart disease of native coronary artery without angina pectoris: Secondary | ICD-10-CM | POA: Diagnosis not present

## 2022-04-14 DIAGNOSIS — D123 Benign neoplasm of transverse colon: Secondary | ICD-10-CM | POA: Diagnosis not present

## 2022-04-14 DIAGNOSIS — I69398 Other sequelae of cerebral infarction: Secondary | ICD-10-CM | POA: Diagnosis not present

## 2022-04-14 DIAGNOSIS — I252 Old myocardial infarction: Secondary | ICD-10-CM | POA: Diagnosis not present

## 2022-04-14 DIAGNOSIS — K635 Polyp of colon: Secondary | ICD-10-CM | POA: Diagnosis not present

## 2022-04-14 DIAGNOSIS — K5730 Diverticulosis of large intestine without perforation or abscess without bleeding: Secondary | ICD-10-CM | POA: Diagnosis not present

## 2022-04-14 DIAGNOSIS — K640 First degree hemorrhoids: Secondary | ICD-10-CM | POA: Diagnosis not present

## 2022-04-14 DIAGNOSIS — Z9889 Other specified postprocedural states: Secondary | ICD-10-CM | POA: Diagnosis not present

## 2022-04-14 DIAGNOSIS — Z87891 Personal history of nicotine dependence: Secondary | ICD-10-CM | POA: Diagnosis not present

## 2022-04-14 NOTE — Telephone Encounter (Signed)
Left message for patient with Dr Youlanda Roys recommendations.  She will call back if needed.

## 2022-04-15 ENCOUNTER — Ambulatory Visit (INDEPENDENT_AMBULATORY_CARE_PROVIDER_SITE_OTHER): Payer: PPO | Admitting: Cardiology

## 2022-04-15 ENCOUNTER — Encounter: Payer: Self-pay | Admitting: Cardiology

## 2022-04-15 VITALS — BP 110/50 | HR 65 | Ht 68.0 in | Wt 168.4 lb

## 2022-04-15 DIAGNOSIS — I480 Paroxysmal atrial fibrillation: Secondary | ICD-10-CM

## 2022-04-15 DIAGNOSIS — I359 Nonrheumatic aortic valve disorder, unspecified: Secondary | ICD-10-CM | POA: Diagnosis not present

## 2022-04-15 DIAGNOSIS — I10 Essential (primary) hypertension: Secondary | ICD-10-CM | POA: Diagnosis not present

## 2022-04-15 DIAGNOSIS — E7800 Pure hypercholesterolemia, unspecified: Secondary | ICD-10-CM | POA: Diagnosis not present

## 2022-04-15 NOTE — Patient Instructions (Signed)

## 2022-04-16 ENCOUNTER — Telehealth: Payer: Self-pay | Admitting: *Deleted

## 2022-04-16 ENCOUNTER — Other Ambulatory Visit: Payer: Self-pay | Admitting: Oncology

## 2022-04-16 ENCOUNTER — Inpatient Hospital Stay: Payer: PPO

## 2022-04-16 ENCOUNTER — Inpatient Hospital Stay: Payer: PPO | Attending: Hematology & Oncology

## 2022-04-16 VITALS — BP 133/60 | HR 63 | Temp 97.7°F | Resp 19

## 2022-04-16 DIAGNOSIS — D631 Anemia in chronic kidney disease: Secondary | ICD-10-CM | POA: Diagnosis not present

## 2022-04-16 DIAGNOSIS — D500 Iron deficiency anemia secondary to blood loss (chronic): Secondary | ICD-10-CM

## 2022-04-16 DIAGNOSIS — C187 Malignant neoplasm of sigmoid colon: Secondary | ICD-10-CM

## 2022-04-16 DIAGNOSIS — N1832 Chronic kidney disease, stage 3b: Secondary | ICD-10-CM | POA: Diagnosis not present

## 2022-04-16 DIAGNOSIS — E611 Iron deficiency: Secondary | ICD-10-CM | POA: Diagnosis not present

## 2022-04-16 DIAGNOSIS — Z85038 Personal history of other malignant neoplasm of large intestine: Secondary | ICD-10-CM | POA: Insufficient documentation

## 2022-04-16 LAB — CMP (CANCER CENTER ONLY)
ALT: 11 U/L (ref 0–44)
AST: 10 U/L — ABNORMAL LOW (ref 15–41)
Albumin: 3.8 g/dL (ref 3.5–5.0)
Alkaline Phosphatase: 91 U/L (ref 38–126)
Anion gap: 9 (ref 5–15)
BUN: 35 mg/dL — ABNORMAL HIGH (ref 8–23)
CO2: 22 mmol/L (ref 22–32)
Calcium: 9.2 mg/dL (ref 8.9–10.3)
Chloride: 109 mmol/L (ref 98–111)
Creatinine: 2.26 mg/dL — ABNORMAL HIGH (ref 0.61–1.24)
GFR, Estimated: 29 mL/min — ABNORMAL LOW (ref >60)
Glucose, Bld: 101 mg/dL — ABNORMAL HIGH (ref 70–99)
Potassium: 4.8 mmol/L (ref 3.5–5.1)
Sodium: 140 mmol/L (ref 135–145)
Total Bilirubin: 0.4 mg/dL (ref 0.3–1.2)
Total Protein: 6.6 g/dL (ref 6.5–8.1)

## 2022-04-16 LAB — CBC WITH DIFFERENTIAL (CANCER CENTER ONLY)
Abs Immature Granulocytes: 0.02 10*3/uL (ref 0.00–0.07)
Basophils Absolute: 0 10*3/uL (ref 0.0–0.1)
Basophils Relative: 0 %
Eosinophils Absolute: 0 10*3/uL (ref 0.0–0.5)
Eosinophils Relative: 0 %
HCT: 23 % — ABNORMAL LOW (ref 39.0–52.0)
Hemoglobin: 7.2 g/dL — ABNORMAL LOW (ref 13.0–17.0)
Immature Granulocytes: 1 %
Lymphocytes Relative: 35 %
Lymphs Abs: 0.8 10*3/uL (ref 0.7–4.0)
MCH: 33.8 pg (ref 26.0–34.0)
MCHC: 31.3 g/dL (ref 30.0–36.0)
MCV: 108 fL — ABNORMAL HIGH (ref 80.0–100.0)
Monocytes Absolute: 0.3 10*3/uL (ref 0.1–1.0)
Monocytes Relative: 12 %
Neutro Abs: 1.2 10*3/uL — ABNORMAL LOW (ref 1.7–7.7)
Neutrophils Relative %: 52 %
Platelet Count: 147 10*3/uL — ABNORMAL LOW (ref 150–400)
RBC: 2.13 MIL/uL — ABNORMAL LOW (ref 4.22–5.81)
RDW: 14.8 % (ref 11.5–15.5)
Smear Review: NORMAL
WBC Count: 2.3 10*3/uL — ABNORMAL LOW (ref 4.0–10.5)
nRBC: 1.8 % — ABNORMAL HIGH (ref 0.0–0.2)

## 2022-04-16 LAB — RETICULOCYTES
Immature Retic Fract: 19.7 % — ABNORMAL HIGH (ref 2.3–15.9)
RBC.: 2.12 MIL/uL — ABNORMAL LOW (ref 4.22–5.81)
Retic Count, Absolute: 53 10*3/uL (ref 19.0–186.0)
Retic Ct Pct: 2.5 % (ref 0.4–3.1)

## 2022-04-16 LAB — FERRITIN: Ferritin: 308 ng/mL (ref 24–336)

## 2022-04-16 LAB — SAMPLE TO BLOOD BANK

## 2022-04-16 MED ORDER — DARBEPOETIN ALFA 300 MCG/0.6ML IJ SOSY
300.0000 ug | PREFILLED_SYRINGE | Freq: Once | INTRAMUSCULAR | Status: AC
  Administered 2022-04-16: 300 ug via SUBCUTANEOUS
  Filled 2022-04-16: qty 0.6

## 2022-04-16 NOTE — Telephone Encounter (Signed)
Call received from patient's wife, Stanton Kidney stating that pt.'s HGB was 7.6 with his PCP on 04/10/22 and would like to know if pt should come in for an Aranesp injection.  Dr. Marin Olp notified and order received for pt to come in today for labs and injection.  Message sent to scheduling.

## 2022-04-17 ENCOUNTER — Other Ambulatory Visit: Payer: Self-pay | Admitting: Family

## 2022-04-17 ENCOUNTER — Encounter: Payer: Self-pay | Admitting: Family Medicine

## 2022-04-17 ENCOUNTER — Ambulatory Visit (INDEPENDENT_AMBULATORY_CARE_PROVIDER_SITE_OTHER): Payer: PPO | Admitting: Family Medicine

## 2022-04-17 ENCOUNTER — Encounter: Payer: Self-pay | Admitting: *Deleted

## 2022-04-17 VITALS — BP 129/46 | HR 58 | Ht 68.0 in | Wt 167.0 lb

## 2022-04-17 DIAGNOSIS — S51812A Laceration without foreign body of left forearm, initial encounter: Secondary | ICD-10-CM | POA: Diagnosis not present

## 2022-04-17 DIAGNOSIS — R5383 Other fatigue: Secondary | ICD-10-CM

## 2022-04-17 LAB — IRON AND IRON BINDING CAPACITY (CC-WL,HP ONLY)
Iron: 51 ug/dL (ref 45–182)
Saturation Ratios: 20 % (ref 17.9–39.5)
TIBC: 256 ug/dL (ref 250–450)
UIBC: 205 ug/dL (ref 117–376)

## 2022-04-17 NOTE — Progress Notes (Signed)
Established Patient Office Visit  Subjective   Patient ID: Roberto Fields, male    DOB: 23-Nov-1945  Age: 76 y.o. MRN: [id number]  Chief Complaint  Patient presents with   Wound Check    HPI  F/U skin tear on forearm -his wife has been changing the dressing about every 2 days.  She said the skin did end up peeling off completely.  She says after the first time she change it there was a bit of an odor but she has not noticed it since then.  His wife also had some concerns about his iron levels that were checked by oncology.  He does currently take an iron pill.  Also still felt really fatigued since his colonoscopy on Tuesday and he wanted to discuss that.  Also has not had a bowel movement since his colonoscopy.    ROS    Objective:     BP (!) 129/46   Pulse (!) 58   Ht '5\' 8"'$  (1.727 m)   Wt 167 lb (75.8 kg)   SpO2 99%   BMI 25.39 kg/m    Physical Exam Vitals reviewed.  Constitutional:      Appearance: He is well-developed.  HENT:     Head: Normocephalic and atraumatic.  Eyes:     Conjunctiva/sclera: Conjunctivae normal.  Cardiovascular:     Rate and Rhythm: Normal rate.  Pulmonary:     Effort: Pulmonary effort is normal.  Skin:    General: Skin is dry.     Coloration: Skin is not pale.     Comments: The area that is denuded is approximately 1 x 2 inches with a second area that is about a half of an inch and more circular just above that on his left forearm.  No foul drainage or odor.  Appears to be healing well no surrounding erythema.  Neurological:     Mental Status: He is alert and oriented to person, place, and time.  Psychiatric:        Behavior: Behavior normal.      No results found for any visits on 04/17/22.    The ASCVD Risk score (Arnett DK, et al., 2019) failed to calculate for the following reasons:   The patient has a prior MI or stroke diagnosis    Assessment & Plan:   Problem List Items Addressed This Visit       Musculoskeletal  and Integument   Skin tear of forearm without complication - Primary   Other Visit Diagnoses     Other fatigue          Skin tear-it actually looks like it is healing really well.  That piece of skin did completely come off and the area is open underneath but no odor or foul discharge.  We placed Xeroform, nonstick gauze and then Coban dressing on top.  I will have his wife change the dressing every 2 days.  As long as it is continuing to heal I will see him back but I am happy to look at it if at any point they are concerned.  Once it looks like it is doing well they no longer have to cover it and encouraged him to apply Vaseline twice a day.  No bowel movement since colonoscopy 3 days ago-we discussed that if he does not have a bowel movement by tomorrow then he may need to take a laxative.  He did try taking a stool softener without any change.  He is not having  significant pain or discomfort he is just feeling tired.  Look over recent labs done by his oncologist.  His iron was borderline low and they have recommended IV iron.  Encouraged them to look out for a call from their office.  No follow-ups on file.    Beatrice Lecher, MD

## 2022-04-17 NOTE — Progress Notes (Signed)
Pt is here to have his wound checked

## 2022-04-21 ENCOUNTER — Telehealth: Payer: Self-pay | Admitting: *Deleted

## 2022-04-21 NOTE — Telephone Encounter (Signed)
Called patient - Per scheduling message Roberto Fields per Dr. Marin Olp (1) dose of IV Iron - confirmed

## 2022-04-22 ENCOUNTER — Inpatient Hospital Stay: Payer: PPO

## 2022-04-22 VITALS — BP 113/46 | HR 53 | Temp 98.0°F | Resp 16

## 2022-04-22 DIAGNOSIS — D500 Iron deficiency anemia secondary to blood loss (chronic): Secondary | ICD-10-CM

## 2022-04-22 DIAGNOSIS — N1832 Chronic kidney disease, stage 3b: Secondary | ICD-10-CM | POA: Diagnosis not present

## 2022-04-22 DIAGNOSIS — C187 Malignant neoplasm of sigmoid colon: Secondary | ICD-10-CM

## 2022-04-22 MED ORDER — SODIUM CHLORIDE 0.9 % IV SOLN
1000.0000 mg | Freq: Once | INTRAVENOUS | Status: AC
  Administered 2022-04-22: 1000 mg via INTRAVENOUS
  Filled 2022-04-22: qty 10

## 2022-04-22 MED ORDER — SODIUM CHLORIDE 0.9 % IV SOLN: Freq: Once | INTRAVENOUS | Status: AC

## 2022-04-22 NOTE — Patient Instructions (Signed)

## 2022-04-23 ENCOUNTER — Telehealth: Payer: Self-pay | Admitting: *Deleted

## 2022-04-23 NOTE — Telephone Encounter (Signed)
Call received from patient's wife to inform Dr. Marin Olp that pt started with abd cramping when he left this office yesterday after an iron infusion and the cramping is worse today.  Pt denies any fever, chills, SOB or pain anywhere else and had a normal BM today.  Dr. Marin Olp notified.  Call placed back to patient's wife and patient's wife notified per order of Dr. Marin Olp to have pt take over the counter Pepto Bismol as directed on bottle and to call office back if this does not help. Pt.'s wife is appreciative of call back and has no further questions at this time.

## 2022-04-24 ENCOUNTER — Telehealth: Payer: Self-pay | Admitting: *Deleted

## 2022-04-24 DIAGNOSIS — Z87891 Personal history of nicotine dependence: Secondary | ICD-10-CM | POA: Diagnosis not present

## 2022-04-24 DIAGNOSIS — K5730 Diverticulosis of large intestine without perforation or abscess without bleeding: Secondary | ICD-10-CM | POA: Diagnosis not present

## 2022-04-24 DIAGNOSIS — I700 Atherosclerosis of aorta: Secondary | ICD-10-CM | POA: Diagnosis not present

## 2022-04-24 DIAGNOSIS — K8020 Calculus of gallbladder without cholecystitis without obstruction: Secondary | ICD-10-CM | POA: Diagnosis not present

## 2022-04-24 DIAGNOSIS — D539 Nutritional anemia, unspecified: Secondary | ICD-10-CM | POA: Diagnosis not present

## 2022-04-24 DIAGNOSIS — D61818 Other pancytopenia: Secondary | ICD-10-CM | POA: Diagnosis not present

## 2022-04-24 DIAGNOSIS — N281 Cyst of kidney, acquired: Secondary | ICD-10-CM | POA: Diagnosis not present

## 2022-04-24 DIAGNOSIS — R109 Unspecified abdominal pain: Secondary | ICD-10-CM | POA: Diagnosis not present

## 2022-04-24 DIAGNOSIS — N184 Chronic kidney disease, stage 4 (severe): Secondary | ICD-10-CM | POA: Diagnosis not present

## 2022-04-24 DIAGNOSIS — K5790 Diverticulosis of intestine, part unspecified, without perforation or abscess without bleeding: Secondary | ICD-10-CM | POA: Diagnosis not present

## 2022-04-24 NOTE — Telephone Encounter (Signed)
Call received from patient's wife stating that pt continues to have abd pain and is not able to get out of bed this morning.  Pt.'s wife notified per order of Dr. Marin Olp to take pt to the [facility] ER now to be evaluated.  Pt.'s wife states that she will take pt to [facility] now.  Dr. Marin Olp notified.

## 2022-04-26 DIAGNOSIS — N289 Disorder of kidney and ureter, unspecified: Secondary | ICD-10-CM | POA: Diagnosis not present

## 2022-04-26 DIAGNOSIS — I480 Paroxysmal atrial fibrillation: Secondary | ICD-10-CM | POA: Diagnosis not present

## 2022-04-26 DIAGNOSIS — I10 Essential (primary) hypertension: Secondary | ICD-10-CM | POA: Diagnosis not present

## 2022-04-26 DIAGNOSIS — I351 Nonrheumatic aortic (valve) insufficiency: Secondary | ICD-10-CM | POA: Diagnosis not present

## 2022-04-26 DIAGNOSIS — I482 Chronic atrial fibrillation, unspecified: Secondary | ICD-10-CM | POA: Diagnosis not present

## 2022-04-26 DIAGNOSIS — M10072 Idiopathic gout, left ankle and foot: Secondary | ICD-10-CM | POA: Diagnosis not present

## 2022-04-26 DIAGNOSIS — E039 Hypothyroidism, unspecified: Secondary | ICD-10-CM | POA: Diagnosis not present

## 2022-04-26 DIAGNOSIS — Z7901 Long term (current) use of anticoagulants: Secondary | ICD-10-CM | POA: Diagnosis not present

## 2022-04-26 DIAGNOSIS — N184 Chronic kidney disease, stage 4 (severe): Secondary | ICD-10-CM | POA: Diagnosis not present

## 2022-04-26 DIAGNOSIS — D61818 Other pancytopenia: Secondary | ICD-10-CM | POA: Diagnosis not present

## 2022-04-26 DIAGNOSIS — I2510 Atherosclerotic heart disease of native coronary artery without angina pectoris: Secondary | ICD-10-CM | POA: Diagnosis not present

## 2022-04-26 DIAGNOSIS — D128 Benign neoplasm of rectum: Secondary | ICD-10-CM | POA: Diagnosis not present

## 2022-04-26 DIAGNOSIS — K5790 Diverticulosis of intestine, part unspecified, without perforation or abscess without bleeding: Secondary | ICD-10-CM | POA: Diagnosis not present

## 2022-04-26 DIAGNOSIS — I959 Hypotension, unspecified: Secondary | ICD-10-CM | POA: Diagnosis not present

## 2022-04-26 DIAGNOSIS — G319 Degenerative disease of nervous system, unspecified: Secondary | ICD-10-CM | POA: Diagnosis not present

## 2022-04-26 DIAGNOSIS — R5383 Other fatigue: Secondary | ICD-10-CM | POA: Diagnosis not present

## 2022-04-26 DIAGNOSIS — Z87891 Personal history of nicotine dependence: Secondary | ICD-10-CM | POA: Diagnosis not present

## 2022-04-26 DIAGNOSIS — R109 Unspecified abdominal pain: Secondary | ICD-10-CM | POA: Diagnosis not present

## 2022-04-26 DIAGNOSIS — I129 Hypertensive chronic kidney disease with stage 1 through stage 4 chronic kidney disease, or unspecified chronic kidney disease: Secondary | ICD-10-CM | POA: Diagnosis not present

## 2022-04-26 DIAGNOSIS — G629 Polyneuropathy, unspecified: Secondary | ICD-10-CM | POA: Diagnosis not present

## 2022-04-26 DIAGNOSIS — R42 Dizziness and giddiness: Secondary | ICD-10-CM | POA: Diagnosis not present

## 2022-04-26 DIAGNOSIS — Z66 Do not resuscitate: Secondary | ICD-10-CM | POA: Diagnosis not present

## 2022-04-26 DIAGNOSIS — J449 Chronic obstructive pulmonary disease, unspecified: Secondary | ICD-10-CM | POA: Diagnosis not present

## 2022-04-26 DIAGNOSIS — R1084 Generalized abdominal pain: Secondary | ICD-10-CM | POA: Diagnosis not present

## 2022-04-26 DIAGNOSIS — R410 Disorientation, unspecified: Secondary | ICD-10-CM | POA: Diagnosis not present

## 2022-04-26 DIAGNOSIS — R001 Bradycardia, unspecified: Secondary | ICD-10-CM | POA: Diagnosis not present

## 2022-04-26 DIAGNOSIS — Z8673 Personal history of transient ischemic attack (TIA), and cerebral infarction without residual deficits: Secondary | ICD-10-CM | POA: Diagnosis not present

## 2022-04-26 DIAGNOSIS — K5730 Diverticulosis of large intestine without perforation or abscess without bleeding: Secondary | ICD-10-CM | POA: Diagnosis not present

## 2022-04-26 DIAGNOSIS — R55 Syncope and collapse: Secondary | ICD-10-CM | POA: Diagnosis not present

## 2022-04-26 DIAGNOSIS — C187 Malignant neoplasm of sigmoid colon: Secondary | ICD-10-CM | POA: Diagnosis not present

## 2022-04-26 DIAGNOSIS — N179 Acute kidney failure, unspecified: Secondary | ICD-10-CM | POA: Diagnosis not present

## 2022-04-26 DIAGNOSIS — A419 Sepsis, unspecified organism: Secondary | ICD-10-CM | POA: Diagnosis not present

## 2022-04-26 DIAGNOSIS — J9811 Atelectasis: Secondary | ICD-10-CM | POA: Diagnosis not present

## 2022-04-26 DIAGNOSIS — D62 Acute posthemorrhagic anemia: Secondary | ICD-10-CM | POA: Diagnosis not present

## 2022-04-26 DIAGNOSIS — R4182 Altered mental status, unspecified: Secondary | ICD-10-CM | POA: Diagnosis not present

## 2022-04-26 DIAGNOSIS — K8020 Calculus of gallbladder without cholecystitis without obstruction: Secondary | ICD-10-CM | POA: Diagnosis not present

## 2022-04-26 DIAGNOSIS — R531 Weakness: Secondary | ICD-10-CM | POA: Diagnosis not present

## 2022-04-26 DIAGNOSIS — D72825 Bandemia: Secondary | ICD-10-CM | POA: Diagnosis not present

## 2022-04-27 DIAGNOSIS — R42 Dizziness and giddiness: Secondary | ICD-10-CM | POA: Diagnosis not present

## 2022-04-27 DIAGNOSIS — R55 Syncope and collapse: Secondary | ICD-10-CM | POA: Diagnosis not present

## 2022-05-01 DIAGNOSIS — R42 Dizziness and giddiness: Secondary | ICD-10-CM | POA: Diagnosis not present

## 2022-05-01 DIAGNOSIS — I6350 Cerebral infarction due to unspecified occlusion or stenosis of unspecified cerebral artery: Secondary | ICD-10-CM | POA: Diagnosis not present

## 2022-05-05 ENCOUNTER — Encounter: Payer: Self-pay | Admitting: Family Medicine

## 2022-05-05 ENCOUNTER — Ambulatory Visit (INDEPENDENT_AMBULATORY_CARE_PROVIDER_SITE_OTHER): Payer: PPO | Admitting: Family Medicine

## 2022-05-05 VITALS — BP 118/42 | HR 60 | Resp 16 | Ht 68.0 in | Wt 161.0 lb

## 2022-05-05 DIAGNOSIS — M1A0720 Idiopathic chronic gout, left ankle and foot, without tophus (tophi): Secondary | ICD-10-CM

## 2022-05-05 DIAGNOSIS — D709 Neutropenia, unspecified: Secondary | ICD-10-CM | POA: Diagnosis not present

## 2022-05-05 DIAGNOSIS — D539 Nutritional anemia, unspecified: Secondary | ICD-10-CM

## 2022-05-05 DIAGNOSIS — Z8673 Personal history of transient ischemic attack (TIA), and cerebral infarction without residual deficits: Secondary | ICD-10-CM

## 2022-05-05 DIAGNOSIS — D696 Thrombocytopenia, unspecified: Secondary | ICD-10-CM | POA: Diagnosis not present

## 2022-05-05 DIAGNOSIS — D500 Iron deficiency anemia secondary to blood loss (chronic): Secondary | ICD-10-CM | POA: Diagnosis not present

## 2022-05-05 NOTE — Assessment & Plan Note (Signed)
Currently on 5 mg of Eliquis.  They suggested considering decreasing down to 2.5.  Would need to reach out to cardiology to see what they thought about this.  If he does end up getting the MRI done with Dr. Aurther Loft I think that actually be would be helpful.  If he is having some small brain bleeds and that would be more reason for [place] to decrease the dose.  But if he is otherwise tolerating it well then we can continue with the 5 mg for now.

## 2022-05-06 LAB — CBC WITH DIFFERENTIAL/PLATELET
Absolute Monocytes: 99 cells/uL — ABNORMAL LOW (ref 200–950)
Basophils Absolute: 0 cells/uL (ref 0–200)
Basophils Relative: 0 %
Eosinophils Absolute: 0 cells/uL — ABNORMAL LOW (ref 15–500)
Eosinophils Relative: 0 %
HCT: 27.3 % — ABNORMAL LOW (ref 38.5–50.0)
Hemoglobin: 8.8 g/dL — ABNORMAL LOW (ref 13.2–17.1)
Lymphs Abs: 582 cells/uL — ABNORMAL LOW (ref 850–3900)
MCH: 32.8 pg (ref 27.0–33.0)
MCHC: 32.2 g/dL (ref 32.0–36.0)
MCV: 101.9 fL — ABNORMAL HIGH (ref 80.0–100.0)
Monocytes Relative: 6.2 %
Neutro Abs: 918 cells/uL — ABNORMAL LOW (ref 1500–7800)
Neutrophils Relative %: 57.4 %
RBC: 2.68 10*6/uL — ABNORMAL LOW (ref 4.20–5.80)
RDW: 15.2 % — ABNORMAL HIGH (ref 11.0–15.0)
Total Lymphocyte: 36.4 %
WBC: 1.6 10*3/uL — ABNORMAL LOW (ref 3.8–10.8)

## 2022-05-06 LAB — COMPLETE METABOLIC PANEL WITH GFR
AG Ratio: 1.6 (calc) (ref 1.0–2.5)
ALT: 7 U/L — ABNORMAL LOW (ref 9–46)
AST: 12 U/L (ref 10–35)
Albumin: 3.9 g/dL (ref 3.6–5.1)
Alkaline phosphatase (APISO): 82 U/L (ref 35–144)
BUN/Creatinine Ratio: 13 (calc) (ref 6–22)
BUN: 35 mg/dL — ABNORMAL HIGH (ref 7–25)
CO2: 23 mmol/L (ref 20–32)
Calcium: 9 mg/dL (ref 8.6–10.3)
Chloride: 110 mmol/L (ref 98–110)
Creat: 2.6 mg/dL — ABNORMAL HIGH (ref 0.70–1.28)
Globulin: 2.4 g/dL (calc) (ref 1.9–3.7)
Glucose, Bld: 109 mg/dL — ABNORMAL HIGH (ref 65–99)
Potassium: 4.3 mmol/L (ref 3.5–5.3)
Sodium: 142 mmol/L (ref 135–146)
Total Bilirubin: 0.7 mg/dL (ref 0.2–1.2)
Total Protein: 6.3 g/dL (ref 6.1–8.1)
eGFR: 24 mL/min/{1.73_m2} — ABNORMAL LOW (ref >=60)

## 2022-05-06 LAB — B12 AND FOLATE PANEL
Folate: 7.5 ng/mL
Vitamin B-12: 1523 pg/mL — ABNORMAL HIGH (ref 200–1100)

## 2022-05-06 NOTE — Progress Notes (Signed)
Hi Ein, B12 is not low.  In fact its quite high.  If you are taking some extra B12 you can cut back on that.  Your folate is normal.  Hemoglobin looks better at 8.8 which is great.  They were unable to run the platelets because they clumped together.  White blood cells are still low but similar to what they were a month ago so we will stay stable.  Kidney function is around 2.6 this is a little higher than your baseline which is usually between 2.2 and 2.4 so just make sure you are hydrating well.

## 2022-05-14 DIAGNOSIS — I6340 Cerebral infarction due to embolism of unspecified cerebral artery: Secondary | ICD-10-CM | POA: Diagnosis not present

## 2022-05-14 DIAGNOSIS — Z7901 Long term (current) use of anticoagulants: Secondary | ICD-10-CM | POA: Diagnosis not present

## 2022-05-14 DIAGNOSIS — I63412 Cerebral infarction due to embolism of left middle cerebral artery: Secondary | ICD-10-CM | POA: Diagnosis not present

## 2022-05-14 DIAGNOSIS — E785 Hyperlipidemia, unspecified: Secondary | ICD-10-CM | POA: Diagnosis not present

## 2022-05-14 DIAGNOSIS — Z8673 Personal history of transient ischemic attack (TIA), and cerebral infarction without residual deficits: Secondary | ICD-10-CM | POA: Diagnosis not present

## 2022-05-14 DIAGNOSIS — R4701 Aphasia: Secondary | ICD-10-CM | POA: Diagnosis not present

## 2022-05-14 DIAGNOSIS — E119 Type 2 diabetes mellitus without complications: Secondary | ICD-10-CM | POA: Diagnosis not present

## 2022-05-14 DIAGNOSIS — N183 Chronic kidney disease, stage 3 unspecified: Secondary | ICD-10-CM | POA: Diagnosis not present

## 2022-05-14 DIAGNOSIS — Z66 Do not resuscitate: Secondary | ICD-10-CM | POA: Diagnosis not present

## 2022-05-14 DIAGNOSIS — I639 Cerebral infarction, unspecified: Secondary | ICD-10-CM | POA: Diagnosis not present

## 2022-05-14 DIAGNOSIS — I480 Paroxysmal atrial fibrillation: Secondary | ICD-10-CM | POA: Diagnosis not present

## 2022-05-14 DIAGNOSIS — I10 Essential (primary) hypertension: Secondary | ICD-10-CM | POA: Diagnosis not present

## 2022-05-14 DIAGNOSIS — Z888 Allergy status to other drugs, medicaments and biological substances status: Secondary | ICD-10-CM | POA: Diagnosis not present

## 2022-05-14 DIAGNOSIS — D61818 Other pancytopenia: Secondary | ICD-10-CM | POA: Diagnosis not present

## 2022-05-14 DIAGNOSIS — J449 Chronic obstructive pulmonary disease, unspecified: Secondary | ICD-10-CM | POA: Diagnosis not present

## 2022-05-14 DIAGNOSIS — I2510 Atherosclerotic heart disease of native coronary artery without angina pectoris: Secondary | ICD-10-CM | POA: Diagnosis not present

## 2022-05-14 DIAGNOSIS — R4781 Slurred speech: Secondary | ICD-10-CM | POA: Diagnosis not present

## 2022-05-14 DIAGNOSIS — I4891 Unspecified atrial fibrillation: Secondary | ICD-10-CM | POA: Diagnosis not present

## 2022-05-14 DIAGNOSIS — Z87891 Personal history of nicotine dependence: Secondary | ICD-10-CM | POA: Diagnosis not present

## 2022-05-14 DIAGNOSIS — I499 Cardiac arrhythmia, unspecified: Secondary | ICD-10-CM | POA: Diagnosis not present

## 2022-05-14 DIAGNOSIS — Z823 Family history of stroke: Secondary | ICD-10-CM | POA: Diagnosis not present

## 2022-05-14 DIAGNOSIS — E039 Hypothyroidism, unspecified: Secondary | ICD-10-CM | POA: Diagnosis not present

## 2022-05-14 DIAGNOSIS — N184 Chronic kidney disease, stage 4 (severe): Secondary | ICD-10-CM | POA: Diagnosis not present

## 2022-05-14 DIAGNOSIS — Z8249 Family history of ischemic heart disease and other diseases of the circulatory system: Secondary | ICD-10-CM | POA: Diagnosis not present

## 2022-05-14 DIAGNOSIS — R4789 Other speech disturbances: Secondary | ICD-10-CM | POA: Diagnosis not present

## 2022-05-14 DIAGNOSIS — E869 Volume depletion, unspecified: Secondary | ICD-10-CM | POA: Diagnosis not present

## 2022-05-14 DIAGNOSIS — I129 Hypertensive chronic kidney disease with stage 1 through stage 4 chronic kidney disease, or unspecified chronic kidney disease: Secondary | ICD-10-CM | POA: Diagnosis not present

## 2022-05-14 DIAGNOSIS — K219 Gastro-esophageal reflux disease without esophagitis: Secondary | ICD-10-CM | POA: Diagnosis not present

## 2022-05-14 DIAGNOSIS — N179 Acute kidney failure, unspecified: Secondary | ICD-10-CM | POA: Diagnosis not present

## 2022-05-14 DIAGNOSIS — I080 Rheumatic disorders of both mitral and aortic valves: Secondary | ICD-10-CM | POA: Diagnosis not present

## 2022-05-14 DIAGNOSIS — Z79899 Other long term (current) drug therapy: Secondary | ICD-10-CM | POA: Diagnosis not present

## 2022-05-14 DIAGNOSIS — R4182 Altered mental status, unspecified: Secondary | ICD-10-CM | POA: Diagnosis not present

## 2022-05-15 ENCOUNTER — Telehealth: Payer: Self-pay | Admitting: *Deleted

## 2022-05-15 NOTE — Telephone Encounter (Signed)
Pt's son called and wanted to inform Dr. Madilyn Fireman that his father has been admitted and will not be able to get his MRI done tomorrow.

## 2022-05-19 DIAGNOSIS — R4701 Aphasia: Secondary | ICD-10-CM | POA: Diagnosis not present

## 2022-05-19 DIAGNOSIS — Z8673 Personal history of transient ischemic attack (TIA), and cerebral infarction without residual deficits: Secondary | ICD-10-CM | POA: Diagnosis not present

## 2022-05-19 DIAGNOSIS — I480 Paroxysmal atrial fibrillation: Secondary | ICD-10-CM | POA: Diagnosis not present

## 2022-05-20 DIAGNOSIS — I10 Essential (primary) hypertension: Secondary | ICD-10-CM | POA: Diagnosis not present

## 2022-05-20 DIAGNOSIS — H409 Unspecified glaucoma: Secondary | ICD-10-CM | POA: Diagnosis not present

## 2022-05-20 DIAGNOSIS — I480 Paroxysmal atrial fibrillation: Secondary | ICD-10-CM | POA: Diagnosis not present

## 2022-05-20 DIAGNOSIS — N184 Chronic kidney disease, stage 4 (severe): Secondary | ICD-10-CM | POA: Diagnosis not present

## 2022-05-20 DIAGNOSIS — I69320 Aphasia following cerebral infarction: Secondary | ICD-10-CM | POA: Diagnosis not present

## 2022-05-20 DIAGNOSIS — E039 Hypothyroidism, unspecified: Secondary | ICD-10-CM | POA: Diagnosis not present

## 2022-05-20 DIAGNOSIS — J449 Chronic obstructive pulmonary disease, unspecified: Secondary | ICD-10-CM | POA: Diagnosis not present

## 2022-05-20 DIAGNOSIS — D649 Anemia, unspecified: Secondary | ICD-10-CM | POA: Diagnosis not present

## 2022-05-20 DIAGNOSIS — I2510 Atherosclerotic heart disease of native coronary artery without angina pectoris: Secondary | ICD-10-CM | POA: Diagnosis not present

## 2022-05-20 DIAGNOSIS — Z7901 Long term (current) use of anticoagulants: Secondary | ICD-10-CM | POA: Diagnosis not present

## 2022-05-25 ENCOUNTER — Ambulatory Visit (INDEPENDENT_AMBULATORY_CARE_PROVIDER_SITE_OTHER): Payer: PPO | Admitting: Family Medicine

## 2022-05-25 ENCOUNTER — Encounter: Payer: Self-pay | Admitting: Family Medicine

## 2022-05-25 VITALS — BP 112/41 | HR 57 | Ht 68.0 in | Wt 157.0 lb

## 2022-05-25 DIAGNOSIS — Z8673 Personal history of transient ischemic attack (TIA), and cerebral infarction without residual deficits: Secondary | ICD-10-CM

## 2022-05-25 DIAGNOSIS — I10 Essential (primary) hypertension: Secondary | ICD-10-CM | POA: Diagnosis not present

## 2022-05-25 DIAGNOSIS — I480 Paroxysmal atrial fibrillation: Secondary | ICD-10-CM

## 2022-05-25 DIAGNOSIS — R531 Weakness: Secondary | ICD-10-CM

## 2022-05-25 DIAGNOSIS — I69320 Aphasia following cerebral infarction: Secondary | ICD-10-CM | POA: Insufficient documentation

## 2022-05-25 NOTE — Progress Notes (Signed)
Established Patient Office Visit  Subjective   Patient ID: Roberto Fields, male    DOB: 1946-04-05  Age: 76 y.o. MRN: [id number]  Chief Complaint  Patient presents with   Hospitalization Follow-up    HPI  Here today for hospital follow-up.  He was admitted on July 6 to [facility] and discharged home on July 9.  He was diagnosed with an acute embolic stroke felt to be secondary to paroxysmal atrial fibrillation.  Acute critical ischemia invol  ving the left temporal lobe with multiple areas of subacute infarcts involving the superior left hemisphere and right cerebellum.  Have a follow-up with neurology tomorrow.  Recommend that he continue his Eliquis.  His wife reports that he did miss a few days of his Eliquis around the time that he had his colonoscopy but that was several weeks before his ED visit.  Is intolerant to statins.  Has follow-up with neurology tomorrow.  Wife did have several concerns that she wanted to discuss today.  She is just noticed that he has been much more fatigued and more physically weak and tired he feels weak all over.  Physical therapy is coming out.  They are coming out for their second visit today and speech therapy has also come out.  She also had some dietary questions.  He wants to make sure that he is getting lots of nutrition but avoiding foods that could affect his renal function or his gout.    ROS    Objective:     BP (!) 112/41   Pulse (!) 57   Ht '5\' 8"'$  (1.727 m)   Wt 157 lb (71.2 kg)   SpO2 97%   BMI 23.87 kg/m    Physical Exam Constitutional:      Appearance: He is well-developed.  HENT:     Head: Normocephalic and atraumatic.  Cardiovascular:     Rate and Rhythm: Normal rate and regular rhythm.     Heart sounds: Normal heart sounds.     Comments: 2/6 SEM Pulmonary:     Effort: Pulmonary effort is normal.     Breath sounds: Normal breath sounds.  Skin:    General: Skin is warm and dry.  Neurological:     Mental Status:  He is alert and oriented to person, place, and time.  Psychiatric:        Behavior: Behavior normal.      No results found for any visits on 05/25/22.    The ASCVD Risk score (Arnett DK, et al., 2019) failed to calculate for the following reasons:   The patient has a prior MI or stroke diagnosis    Assessment & Plan:   Problem List Items Addressed This Visit       Cardiovascular and Mediastinum   HYPERTENSION, BENIGN ESSENTIAL - Primary    Blood pressure looks good today.  In fact it is on the lower end it could be somewhat contributing to some of his fatigue.      Relevant Medications   amLODipine (NORVASC) 10 MG tablet   Atrial fibrillation ([place])    He is due for repeat colonoscopy in December and is worried about having to come off of the Eliquis.  We can certainly reassess when we get closer to that time in regards to his health and overall risk.  If we did need to come off for the procedure I would recommend only holding the medication the day before and the morning of and then restarting it  the night after the procedure.  I would not recommend holding for 3 days prior      Relevant Medications   amLODipine (NORVASC) 10 MG tablet     Other   History of ischemic stroke    He is taking his Eliquis consistently.  There was some debate previously about adjusting his dose but for right now I recommend that he stay on his current dosing regimen.  He still having difficulty with expressive aphasia.  I am unclear how much he will recover and how quickly.  His wife did have some questions around that.  Also encouraged her to discuss that with neurologist tomorrow as well.      Aphasia as late effect of cerebrovascular accident (CVA)    Continue with speech therapy.  Keep follow-up with neurology tomorrow.      Other Visit Diagnoses     Generalized weakness           Generalized weakness-we discussed the importance of continuing to move and avoid sitting for prolonged  periods of time.  Utilizing some of the things that physical therapist are teaching him and do those on the days that they are not coming into the house.  And continue to work on his speech therapy.  No follow-ups on file.    I spent 35 minutes on the day of the encounter to include pre-visit record review, face-to-face time with the patient and post visit ordering of test.   Beatrice Lecher, MD

## 2022-05-25 NOTE — Progress Notes (Signed)
Pt's wife is concerned about him being tired and sleeping so much since having the stroke.  He has a f/u appt with Dr. Everette Rank (Neurologist) tomorrow.

## 2022-05-25 NOTE — Assessment & Plan Note (Signed)
He is taking his Eliquis consistently.  There was some debate previously about adjusting his dose but for right now I recommend that he stay on his current dosing regimen.  He still having difficulty with expressive aphasia.  I am unclear how much he will recover and how quickly.  His wife did have some questions around that.  Also encouraged her to discuss that with neurologist tomorrow as well.

## 2022-05-25 NOTE — Assessment & Plan Note (Signed)
He is due for repeat colonoscopy in December and is worried about having to come off of the Eliquis.  We can certainly reassess when we get closer to that time in regards to his health and overall risk.  If we did need to come off for the procedure I would recommend only holding the medication the day before and the morning of and then restarting it the night after the procedure.  I would not recommend holding for 3 days prior

## 2022-05-25 NOTE — Assessment & Plan Note (Signed)
Continue with speech therapy.  Keep follow-up with neurology tomorrow.

## 2022-05-25 NOTE — Assessment & Plan Note (Signed)
Blood pressure looks good today.  In fact it is on the lower end it could be somewhat contributing to some of his fatigue.

## 2022-05-26 DIAGNOSIS — I69320 Aphasia following cerebral infarction: Secondary | ICD-10-CM | POA: Diagnosis not present

## 2022-06-03 DIAGNOSIS — I69320 Aphasia following cerebral infarction: Secondary | ICD-10-CM | POA: Diagnosis not present

## 2022-06-03 DIAGNOSIS — I480 Paroxysmal atrial fibrillation: Secondary | ICD-10-CM | POA: Diagnosis not present

## 2022-06-06 DIAGNOSIS — I69320 Aphasia following cerebral infarction: Secondary | ICD-10-CM | POA: Diagnosis not present

## 2022-06-06 DIAGNOSIS — I252 Old myocardial infarction: Secondary | ICD-10-CM | POA: Diagnosis not present

## 2022-06-06 DIAGNOSIS — G319 Degenerative disease of nervous system, unspecified: Secondary | ICD-10-CM | POA: Diagnosis not present

## 2022-06-06 DIAGNOSIS — M79675 Pain in left toe(s): Secondary | ICD-10-CM | POA: Diagnosis not present

## 2022-06-06 DIAGNOSIS — R7983 Abnormal findings of blood amino-acid level: Secondary | ICD-10-CM | POA: Diagnosis not present

## 2022-06-06 DIAGNOSIS — I6201 Nontraumatic acute subdural hemorrhage: Secondary | ICD-10-CM | POA: Diagnosis not present

## 2022-06-06 DIAGNOSIS — I6523 Occlusion and stenosis of bilateral carotid arteries: Secondary | ICD-10-CM | POA: Diagnosis not present

## 2022-06-06 DIAGNOSIS — Z515 Encounter for palliative care: Secondary | ICD-10-CM | POA: Diagnosis not present

## 2022-06-06 DIAGNOSIS — D649 Anemia, unspecified: Secondary | ICD-10-CM | POA: Diagnosis not present

## 2022-06-06 DIAGNOSIS — N189 Chronic kidney disease, unspecified: Secondary | ICD-10-CM | POA: Diagnosis not present

## 2022-06-06 DIAGNOSIS — K219 Gastro-esophageal reflux disease without esophagitis: Secondary | ICD-10-CM | POA: Diagnosis not present

## 2022-06-06 DIAGNOSIS — I480 Paroxysmal atrial fibrillation: Secondary | ICD-10-CM | POA: Diagnosis not present

## 2022-06-06 DIAGNOSIS — R0602 Shortness of breath: Secondary | ICD-10-CM | POA: Diagnosis not present

## 2022-06-06 DIAGNOSIS — R059 Cough, unspecified: Secondary | ICD-10-CM | POA: Diagnosis not present

## 2022-06-06 DIAGNOSIS — G9389 Other specified disorders of brain: Secondary | ICD-10-CM | POA: Diagnosis not present

## 2022-06-06 DIAGNOSIS — R9082 White matter disease, unspecified: Secondary | ICD-10-CM | POA: Diagnosis not present

## 2022-06-06 DIAGNOSIS — I63422 Cerebral infarction due to embolism of left anterior cerebral artery: Secondary | ICD-10-CM | POA: Diagnosis not present

## 2022-06-06 DIAGNOSIS — F03A11 Unspecified dementia, mild, with agitation: Secondary | ICD-10-CM | POA: Diagnosis not present

## 2022-06-06 DIAGNOSIS — I10 Essential (primary) hypertension: Secondary | ICD-10-CM | POA: Diagnosis not present

## 2022-06-06 DIAGNOSIS — M7989 Other specified soft tissue disorders: Secondary | ICD-10-CM | POA: Diagnosis not present

## 2022-06-06 DIAGNOSIS — I2510 Atherosclerotic heart disease of native coronary artery without angina pectoris: Secondary | ICD-10-CM | POA: Diagnosis not present

## 2022-06-06 DIAGNOSIS — Z7901 Long term (current) use of anticoagulants: Secondary | ICD-10-CM | POA: Diagnosis not present

## 2022-06-06 DIAGNOSIS — N390 Urinary tract infection, site not specified: Secondary | ICD-10-CM | POA: Diagnosis not present

## 2022-06-06 DIAGNOSIS — F0390 Unspecified dementia without behavioral disturbance: Secondary | ICD-10-CM | POA: Diagnosis not present

## 2022-06-06 DIAGNOSIS — R300 Dysuria: Secondary | ICD-10-CM | POA: Diagnosis not present

## 2022-06-06 DIAGNOSIS — I639 Cerebral infarction, unspecified: Secondary | ICD-10-CM | POA: Diagnosis not present

## 2022-06-06 DIAGNOSIS — E878 Other disorders of electrolyte and fluid balance, not elsewhere classified: Secondary | ICD-10-CM | POA: Diagnosis not present

## 2022-06-06 DIAGNOSIS — M6281 Muscle weakness (generalized): Secondary | ICD-10-CM | POA: Diagnosis not present

## 2022-06-06 DIAGNOSIS — R52 Pain, unspecified: Secondary | ICD-10-CM | POA: Diagnosis not present

## 2022-06-06 DIAGNOSIS — I130 Hypertensive heart and chronic kidney disease with heart failure and stage 1 through stage 4 chronic kidney disease, or unspecified chronic kidney disease: Secondary | ICD-10-CM | POA: Diagnosis not present

## 2022-06-06 DIAGNOSIS — N184 Chronic kidney disease, stage 4 (severe): Secondary | ICD-10-CM | POA: Diagnosis not present

## 2022-06-06 DIAGNOSIS — S065X0D Traumatic subdural hemorrhage without loss of consciousness, subsequent encounter: Secondary | ICD-10-CM | POA: Diagnosis not present

## 2022-06-06 DIAGNOSIS — N170 Acute kidney failure with tubular necrosis: Secondary | ICD-10-CM | POA: Diagnosis not present

## 2022-06-06 DIAGNOSIS — Z85048 Personal history of other malignant neoplasm of rectum, rectosigmoid junction, and anus: Secondary | ICD-10-CM | POA: Diagnosis not present

## 2022-06-06 DIAGNOSIS — I6200 Nontraumatic subdural hemorrhage, unspecified: Secondary | ICD-10-CM | POA: Diagnosis not present

## 2022-06-06 DIAGNOSIS — D469 Myelodysplastic syndrome, unspecified: Secondary | ICD-10-CM | POA: Diagnosis not present

## 2022-06-06 DIAGNOSIS — R531 Weakness: Secondary | ICD-10-CM | POA: Diagnosis not present

## 2022-06-06 DIAGNOSIS — R638 Other symptoms and signs concerning food and fluid intake: Secondary | ICD-10-CM | POA: Diagnosis not present

## 2022-06-06 DIAGNOSIS — S065X0A Traumatic subdural hemorrhage without loss of consciousness, initial encounter: Secondary | ICD-10-CM | POA: Diagnosis not present

## 2022-06-06 DIAGNOSIS — J323 Chronic sphenoidal sinusitis: Secondary | ICD-10-CM | POA: Diagnosis not present

## 2022-06-06 DIAGNOSIS — N3090 Cystitis, unspecified without hematuria: Secondary | ICD-10-CM | POA: Diagnosis not present

## 2022-06-06 DIAGNOSIS — E8729 Other acidosis: Secondary | ICD-10-CM | POA: Diagnosis not present

## 2022-06-06 DIAGNOSIS — N179 Acute kidney failure, unspecified: Secondary | ICD-10-CM | POA: Diagnosis not present

## 2022-06-06 DIAGNOSIS — M7732 Calcaneal spur, left foot: Secondary | ICD-10-CM | POA: Diagnosis not present

## 2022-06-06 DIAGNOSIS — R490 Dysphonia: Secondary | ICD-10-CM | POA: Diagnosis not present

## 2022-06-06 DIAGNOSIS — W19XXXD Unspecified fall, subsequent encounter: Secondary | ICD-10-CM | POA: Diagnosis not present

## 2022-06-06 DIAGNOSIS — I482 Chronic atrial fibrillation, unspecified: Secondary | ICD-10-CM | POA: Diagnosis not present

## 2022-06-06 DIAGNOSIS — M1030 Gout due to renal impairment, unspecified site: Secondary | ICD-10-CM | POA: Diagnosis not present

## 2022-06-06 DIAGNOSIS — I509 Heart failure, unspecified: Secondary | ICD-10-CM | POA: Diagnosis not present

## 2022-06-06 DIAGNOSIS — I4891 Unspecified atrial fibrillation: Secondary | ICD-10-CM | POA: Diagnosis not present

## 2022-06-06 DIAGNOSIS — I129 Hypertensive chronic kidney disease with stage 1 through stage 4 chronic kidney disease, or unspecified chronic kidney disease: Secondary | ICD-10-CM | POA: Diagnosis not present

## 2022-06-06 DIAGNOSIS — R310 Gross hematuria: Secondary | ICD-10-CM | POA: Diagnosis not present

## 2022-06-06 DIAGNOSIS — D631 Anemia in chronic kidney disease: Secondary | ICD-10-CM | POA: Diagnosis not present

## 2022-06-06 DIAGNOSIS — N183 Chronic kidney disease, stage 3 unspecified: Secondary | ICD-10-CM | POA: Diagnosis not present

## 2022-06-06 DIAGNOSIS — W19XXXA Unspecified fall, initial encounter: Secondary | ICD-10-CM | POA: Diagnosis not present

## 2022-06-06 DIAGNOSIS — R509 Fever, unspecified: Secondary | ICD-10-CM | POA: Diagnosis not present

## 2022-06-06 DIAGNOSIS — I69398 Other sequelae of cerebral infarction: Secondary | ICD-10-CM | POA: Diagnosis not present

## 2022-06-06 DIAGNOSIS — R519 Headache, unspecified: Secondary | ICD-10-CM | POA: Diagnosis not present

## 2022-06-06 DIAGNOSIS — R5383 Other fatigue: Secondary | ICD-10-CM | POA: Diagnosis not present

## 2022-06-06 DIAGNOSIS — R4701 Aphasia: Secondary | ICD-10-CM | POA: Diagnosis not present

## 2022-06-06 DIAGNOSIS — K922 Gastrointestinal hemorrhage, unspecified: Secondary | ICD-10-CM | POA: Diagnosis not present

## 2022-06-06 DIAGNOSIS — S065XAA Traumatic subdural hemorrhage with loss of consciousness status unknown, initial encounter: Secondary | ICD-10-CM | POA: Diagnosis not present

## 2022-06-06 DIAGNOSIS — J449 Chronic obstructive pulmonary disease, unspecified: Secondary | ICD-10-CM | POA: Diagnosis not present

## 2022-06-06 DIAGNOSIS — Z743 Need for continuous supervision: Secondary | ICD-10-CM | POA: Diagnosis not present

## 2022-06-06 DIAGNOSIS — M109 Gout, unspecified: Secondary | ICD-10-CM | POA: Diagnosis not present

## 2022-06-06 DIAGNOSIS — R051 Acute cough: Secondary | ICD-10-CM | POA: Diagnosis not present

## 2022-06-06 DIAGNOSIS — R7989 Other specified abnormal findings of blood chemistry: Secondary | ICD-10-CM | POA: Diagnosis not present

## 2022-06-06 DIAGNOSIS — H409 Unspecified glaucoma: Secondary | ICD-10-CM | POA: Diagnosis not present

## 2022-06-06 DIAGNOSIS — Y929 Unspecified place or not applicable: Secondary | ICD-10-CM | POA: Diagnosis not present

## 2022-06-06 DIAGNOSIS — Z20822 Contact with and (suspected) exposure to covid-19: Secondary | ICD-10-CM | POA: Diagnosis not present

## 2022-06-06 DIAGNOSIS — R627 Adult failure to thrive: Secondary | ICD-10-CM | POA: Diagnosis not present

## 2022-06-06 DIAGNOSIS — D539 Nutritional anemia, unspecified: Secondary | ICD-10-CM | POA: Diagnosis not present

## 2022-06-06 DIAGNOSIS — N281 Cyst of kidney, acquired: Secondary | ICD-10-CM | POA: Diagnosis not present

## 2022-06-06 DIAGNOSIS — E872 Acidosis, unspecified: Secondary | ICD-10-CM | POA: Diagnosis not present

## 2022-06-06 DIAGNOSIS — G4489 Other headache syndrome: Secondary | ICD-10-CM | POA: Diagnosis not present

## 2022-06-06 DIAGNOSIS — R5381 Other malaise: Secondary | ICD-10-CM | POA: Diagnosis not present

## 2022-06-06 DIAGNOSIS — D61818 Other pancytopenia: Secondary | ICD-10-CM | POA: Diagnosis not present

## 2022-06-06 DIAGNOSIS — I959 Hypotension, unspecified: Secondary | ICD-10-CM | POA: Diagnosis not present

## 2022-06-06 DIAGNOSIS — M25572 Pain in left ankle and joints of left foot: Secondary | ICD-10-CM | POA: Diagnosis not present

## 2022-06-06 DIAGNOSIS — Y939 Activity, unspecified: Secondary | ICD-10-CM | POA: Diagnosis not present

## 2022-06-06 DIAGNOSIS — Y92009 Unspecified place in unspecified non-institutional (private) residence as the place of occurrence of the external cause: Secondary | ICD-10-CM | POA: Diagnosis not present

## 2022-06-07 DIAGNOSIS — R531 Weakness: Secondary | ICD-10-CM | POA: Diagnosis not present

## 2022-06-07 DIAGNOSIS — N179 Acute kidney failure, unspecified: Secondary | ICD-10-CM | POA: Diagnosis not present

## 2022-06-07 DIAGNOSIS — N189 Chronic kidney disease, unspecified: Secondary | ICD-10-CM | POA: Diagnosis not present

## 2022-06-07 DIAGNOSIS — D649 Anemia, unspecified: Secondary | ICD-10-CM | POA: Diagnosis not present

## 2022-06-07 DIAGNOSIS — I6201 Nontraumatic acute subdural hemorrhage: Secondary | ICD-10-CM | POA: Diagnosis not present

## 2022-06-07 DIAGNOSIS — G319 Degenerative disease of nervous system, unspecified: Secondary | ICD-10-CM | POA: Diagnosis not present

## 2022-06-07 DIAGNOSIS — I69320 Aphasia following cerebral infarction: Secondary | ICD-10-CM | POA: Diagnosis not present

## 2022-06-07 DIAGNOSIS — Z7901 Long term (current) use of anticoagulants: Secondary | ICD-10-CM | POA: Diagnosis not present

## 2022-06-07 DIAGNOSIS — I6200 Nontraumatic subdural hemorrhage, unspecified: Secondary | ICD-10-CM | POA: Diagnosis not present

## 2022-06-07 DIAGNOSIS — W19XXXA Unspecified fall, initial encounter: Secondary | ICD-10-CM | POA: Diagnosis not present

## 2022-06-07 DIAGNOSIS — R9082 White matter disease, unspecified: Secondary | ICD-10-CM | POA: Diagnosis not present

## 2022-06-07 DIAGNOSIS — D61818 Other pancytopenia: Secondary | ICD-10-CM | POA: Diagnosis not present

## 2022-06-07 DIAGNOSIS — Y929 Unspecified place or not applicable: Secondary | ICD-10-CM | POA: Diagnosis not present

## 2022-06-07 DIAGNOSIS — R7989 Other specified abnormal findings of blood chemistry: Secondary | ICD-10-CM | POA: Diagnosis not present

## 2022-06-07 DIAGNOSIS — J323 Chronic sphenoidal sinusitis: Secondary | ICD-10-CM | POA: Diagnosis not present

## 2022-06-08 DIAGNOSIS — I480 Paroxysmal atrial fibrillation: Secondary | ICD-10-CM | POA: Diagnosis not present

## 2022-06-08 DIAGNOSIS — Y939 Activity, unspecified: Secondary | ICD-10-CM | POA: Diagnosis not present

## 2022-06-08 DIAGNOSIS — I69320 Aphasia following cerebral infarction: Secondary | ICD-10-CM | POA: Diagnosis not present

## 2022-06-08 DIAGNOSIS — D61818 Other pancytopenia: Secondary | ICD-10-CM | POA: Diagnosis not present

## 2022-06-08 DIAGNOSIS — M7989 Other specified soft tissue disorders: Secondary | ICD-10-CM | POA: Diagnosis not present

## 2022-06-08 DIAGNOSIS — M25572 Pain in left ankle and joints of left foot: Secondary | ICD-10-CM | POA: Diagnosis not present

## 2022-06-08 DIAGNOSIS — N189 Chronic kidney disease, unspecified: Secondary | ICD-10-CM | POA: Diagnosis not present

## 2022-06-08 DIAGNOSIS — R5381 Other malaise: Secondary | ICD-10-CM | POA: Diagnosis not present

## 2022-06-08 DIAGNOSIS — M7732 Calcaneal spur, left foot: Secondary | ICD-10-CM | POA: Diagnosis not present

## 2022-06-08 DIAGNOSIS — W19XXXA Unspecified fall, initial encounter: Secondary | ICD-10-CM | POA: Diagnosis not present

## 2022-06-08 DIAGNOSIS — S065XAA Traumatic subdural hemorrhage with loss of consciousness status unknown, initial encounter: Secondary | ICD-10-CM | POA: Diagnosis not present

## 2022-06-08 DIAGNOSIS — G4489 Other headache syndrome: Secondary | ICD-10-CM | POA: Diagnosis not present

## 2022-06-08 DIAGNOSIS — N179 Acute kidney failure, unspecified: Secondary | ICD-10-CM | POA: Diagnosis not present

## 2022-06-08 DIAGNOSIS — I4891 Unspecified atrial fibrillation: Secondary | ICD-10-CM | POA: Diagnosis not present

## 2022-06-08 DIAGNOSIS — I6201 Nontraumatic acute subdural hemorrhage: Secondary | ICD-10-CM | POA: Diagnosis not present

## 2022-06-08 DIAGNOSIS — Z7901 Long term (current) use of anticoagulants: Secondary | ICD-10-CM | POA: Diagnosis not present

## 2022-06-08 DIAGNOSIS — Y92009 Unspecified place in unspecified non-institutional (private) residence as the place of occurrence of the external cause: Secondary | ICD-10-CM | POA: Diagnosis not present

## 2022-06-08 DIAGNOSIS — R531 Weakness: Secondary | ICD-10-CM | POA: Diagnosis not present

## 2022-06-08 DIAGNOSIS — Z515 Encounter for palliative care: Secondary | ICD-10-CM | POA: Diagnosis not present

## 2022-06-08 DIAGNOSIS — Y929 Unspecified place or not applicable: Secondary | ICD-10-CM | POA: Diagnosis not present

## 2022-06-08 DIAGNOSIS — R52 Pain, unspecified: Secondary | ICD-10-CM | POA: Diagnosis not present

## 2022-06-08 DIAGNOSIS — D649 Anemia, unspecified: Secondary | ICD-10-CM | POA: Diagnosis not present

## 2022-06-09 DIAGNOSIS — N189 Chronic kidney disease, unspecified: Secondary | ICD-10-CM | POA: Diagnosis not present

## 2022-06-09 DIAGNOSIS — I6201 Nontraumatic acute subdural hemorrhage: Secondary | ICD-10-CM | POA: Diagnosis not present

## 2022-06-09 DIAGNOSIS — I4891 Unspecified atrial fibrillation: Secondary | ICD-10-CM | POA: Diagnosis not present

## 2022-06-09 DIAGNOSIS — I639 Cerebral infarction, unspecified: Secondary | ICD-10-CM | POA: Diagnosis not present

## 2022-06-09 DIAGNOSIS — D649 Anemia, unspecified: Secondary | ICD-10-CM | POA: Diagnosis not present

## 2022-06-09 DIAGNOSIS — W19XXXA Unspecified fall, initial encounter: Secondary | ICD-10-CM | POA: Diagnosis not present

## 2022-06-09 DIAGNOSIS — I129 Hypertensive chronic kidney disease with stage 1 through stage 4 chronic kidney disease, or unspecified chronic kidney disease: Secondary | ICD-10-CM | POA: Diagnosis not present

## 2022-06-09 DIAGNOSIS — Y92009 Unspecified place in unspecified non-institutional (private) residence as the place of occurrence of the external cause: Secondary | ICD-10-CM | POA: Diagnosis not present

## 2022-06-09 DIAGNOSIS — R531 Weakness: Secondary | ICD-10-CM | POA: Diagnosis not present

## 2022-06-10 ENCOUNTER — Inpatient Hospital Stay: Payer: PPO

## 2022-06-10 ENCOUNTER — Inpatient Hospital Stay: Payer: PPO | Admitting: Hematology & Oncology

## 2022-06-10 DIAGNOSIS — R4701 Aphasia: Secondary | ICD-10-CM | POA: Diagnosis not present

## 2022-06-10 DIAGNOSIS — Z515 Encounter for palliative care: Secondary | ICD-10-CM | POA: Diagnosis not present

## 2022-06-10 DIAGNOSIS — R5381 Other malaise: Secondary | ICD-10-CM | POA: Diagnosis not present

## 2022-06-10 DIAGNOSIS — R5383 Other fatigue: Secondary | ICD-10-CM | POA: Diagnosis not present

## 2022-06-10 DIAGNOSIS — R059 Cough, unspecified: Secondary | ICD-10-CM | POA: Diagnosis not present

## 2022-06-10 DIAGNOSIS — R638 Other symptoms and signs concerning food and fluid intake: Secondary | ICD-10-CM | POA: Diagnosis not present

## 2022-06-10 DIAGNOSIS — G9389 Other specified disorders of brain: Secondary | ICD-10-CM | POA: Diagnosis not present

## 2022-06-10 DIAGNOSIS — G319 Degenerative disease of nervous system, unspecified: Secondary | ICD-10-CM | POA: Diagnosis not present

## 2022-06-10 DIAGNOSIS — R0602 Shortness of breath: Secondary | ICD-10-CM | POA: Diagnosis not present

## 2022-06-10 DIAGNOSIS — R519 Headache, unspecified: Secondary | ICD-10-CM | POA: Diagnosis not present

## 2022-06-10 DIAGNOSIS — R509 Fever, unspecified: Secondary | ICD-10-CM | POA: Diagnosis not present

## 2022-06-10 DIAGNOSIS — I639 Cerebral infarction, unspecified: Secondary | ICD-10-CM | POA: Diagnosis not present

## 2022-06-11 DIAGNOSIS — Y92009 Unspecified place in unspecified non-institutional (private) residence as the place of occurrence of the external cause: Secondary | ICD-10-CM | POA: Diagnosis not present

## 2022-06-11 DIAGNOSIS — I2510 Atherosclerotic heart disease of native coronary artery without angina pectoris: Secondary | ICD-10-CM | POA: Diagnosis not present

## 2022-06-11 DIAGNOSIS — I6201 Nontraumatic acute subdural hemorrhage: Secondary | ICD-10-CM | POA: Diagnosis not present

## 2022-06-11 DIAGNOSIS — N183 Chronic kidney disease, stage 3 unspecified: Secondary | ICD-10-CM | POA: Diagnosis not present

## 2022-06-11 DIAGNOSIS — Y939 Activity, unspecified: Secondary | ICD-10-CM | POA: Diagnosis not present

## 2022-06-11 DIAGNOSIS — N3090 Cystitis, unspecified without hematuria: Secondary | ICD-10-CM | POA: Diagnosis not present

## 2022-06-11 DIAGNOSIS — R4701 Aphasia: Secondary | ICD-10-CM | POA: Diagnosis not present

## 2022-06-11 DIAGNOSIS — R051 Acute cough: Secondary | ICD-10-CM | POA: Diagnosis not present

## 2022-06-11 DIAGNOSIS — I10 Essential (primary) hypertension: Secondary | ICD-10-CM | POA: Diagnosis not present

## 2022-06-11 DIAGNOSIS — E8729 Other acidosis: Secondary | ICD-10-CM | POA: Diagnosis not present

## 2022-06-11 DIAGNOSIS — N179 Acute kidney failure, unspecified: Secondary | ICD-10-CM | POA: Diagnosis not present

## 2022-06-11 DIAGNOSIS — R509 Fever, unspecified: Secondary | ICD-10-CM | POA: Diagnosis not present

## 2022-06-11 DIAGNOSIS — R7983 Abnormal findings of blood amino-acid level: Secondary | ICD-10-CM | POA: Diagnosis not present

## 2022-06-11 DIAGNOSIS — R519 Headache, unspecified: Secondary | ICD-10-CM | POA: Diagnosis not present

## 2022-06-11 DIAGNOSIS — I639 Cerebral infarction, unspecified: Secondary | ICD-10-CM | POA: Diagnosis not present

## 2022-06-11 DIAGNOSIS — W19XXXA Unspecified fall, initial encounter: Secondary | ICD-10-CM | POA: Diagnosis not present

## 2022-06-11 DIAGNOSIS — R300 Dysuria: Secondary | ICD-10-CM | POA: Diagnosis not present

## 2022-06-11 DIAGNOSIS — N281 Cyst of kidney, acquired: Secondary | ICD-10-CM | POA: Diagnosis not present

## 2022-06-12 DIAGNOSIS — R4701 Aphasia: Secondary | ICD-10-CM | POA: Diagnosis not present

## 2022-06-12 DIAGNOSIS — R051 Acute cough: Secondary | ICD-10-CM | POA: Diagnosis not present

## 2022-06-12 DIAGNOSIS — R300 Dysuria: Secondary | ICD-10-CM | POA: Diagnosis not present

## 2022-06-12 DIAGNOSIS — W19XXXA Unspecified fall, initial encounter: Secondary | ICD-10-CM | POA: Diagnosis not present

## 2022-06-12 DIAGNOSIS — R509 Fever, unspecified: Secondary | ICD-10-CM | POA: Diagnosis not present

## 2022-06-12 DIAGNOSIS — I6201 Nontraumatic acute subdural hemorrhage: Secondary | ICD-10-CM | POA: Diagnosis not present

## 2022-06-12 DIAGNOSIS — Y92009 Unspecified place in unspecified non-institutional (private) residence as the place of occurrence of the external cause: Secondary | ICD-10-CM | POA: Diagnosis not present

## 2022-06-12 DIAGNOSIS — N183 Chronic kidney disease, stage 3 unspecified: Secondary | ICD-10-CM | POA: Diagnosis not present

## 2022-06-12 DIAGNOSIS — R7983 Abnormal findings of blood amino-acid level: Secondary | ICD-10-CM | POA: Diagnosis not present

## 2022-06-12 DIAGNOSIS — I10 Essential (primary) hypertension: Secondary | ICD-10-CM | POA: Diagnosis not present

## 2022-06-12 DIAGNOSIS — I639 Cerebral infarction, unspecified: Secondary | ICD-10-CM | POA: Diagnosis not present

## 2022-06-12 DIAGNOSIS — R519 Headache, unspecified: Secondary | ICD-10-CM | POA: Diagnosis not present

## 2022-06-12 DIAGNOSIS — E8729 Other acidosis: Secondary | ICD-10-CM | POA: Diagnosis not present

## 2022-06-12 DIAGNOSIS — N3090 Cystitis, unspecified without hematuria: Secondary | ICD-10-CM | POA: Diagnosis not present

## 2022-06-12 DIAGNOSIS — Y939 Activity, unspecified: Secondary | ICD-10-CM | POA: Diagnosis not present

## 2022-06-12 DIAGNOSIS — I2510 Atherosclerotic heart disease of native coronary artery without angina pectoris: Secondary | ICD-10-CM | POA: Diagnosis not present

## 2022-06-13 DIAGNOSIS — M79675 Pain in left toe(s): Secondary | ICD-10-CM | POA: Diagnosis not present

## 2022-06-13 DIAGNOSIS — I639 Cerebral infarction, unspecified: Secondary | ICD-10-CM | POA: Diagnosis not present

## 2022-06-13 DIAGNOSIS — Y939 Activity, unspecified: Secondary | ICD-10-CM | POA: Diagnosis not present

## 2022-06-13 DIAGNOSIS — N189 Chronic kidney disease, unspecified: Secondary | ICD-10-CM | POA: Diagnosis not present

## 2022-06-13 DIAGNOSIS — Y92009 Unspecified place in unspecified non-institutional (private) residence as the place of occurrence of the external cause: Secondary | ICD-10-CM | POA: Diagnosis not present

## 2022-06-13 DIAGNOSIS — R4701 Aphasia: Secondary | ICD-10-CM | POA: Diagnosis not present

## 2022-06-13 DIAGNOSIS — N179 Acute kidney failure, unspecified: Secondary | ICD-10-CM | POA: Diagnosis not present

## 2022-06-13 DIAGNOSIS — D61818 Other pancytopenia: Secondary | ICD-10-CM | POA: Diagnosis not present

## 2022-06-13 DIAGNOSIS — W19XXXA Unspecified fall, initial encounter: Secondary | ICD-10-CM | POA: Diagnosis not present

## 2022-06-13 DIAGNOSIS — R509 Fever, unspecified: Secondary | ICD-10-CM | POA: Diagnosis not present

## 2022-06-13 DIAGNOSIS — I6201 Nontraumatic acute subdural hemorrhage: Secondary | ICD-10-CM | POA: Diagnosis not present

## 2022-06-13 DIAGNOSIS — D539 Nutritional anemia, unspecified: Secondary | ICD-10-CM | POA: Diagnosis not present

## 2022-06-14 DIAGNOSIS — N179 Acute kidney failure, unspecified: Secondary | ICD-10-CM | POA: Diagnosis not present

## 2022-06-14 DIAGNOSIS — M7989 Other specified soft tissue disorders: Secondary | ICD-10-CM | POA: Diagnosis not present

## 2022-06-14 DIAGNOSIS — W19XXXA Unspecified fall, initial encounter: Secondary | ICD-10-CM | POA: Diagnosis not present

## 2022-06-14 DIAGNOSIS — Y939 Activity, unspecified: Secondary | ICD-10-CM | POA: Diagnosis not present

## 2022-06-14 DIAGNOSIS — I639 Cerebral infarction, unspecified: Secondary | ICD-10-CM | POA: Diagnosis not present

## 2022-06-14 DIAGNOSIS — M25572 Pain in left ankle and joints of left foot: Secondary | ICD-10-CM | POA: Diagnosis not present

## 2022-06-14 DIAGNOSIS — I6201 Nontraumatic acute subdural hemorrhage: Secondary | ICD-10-CM | POA: Diagnosis not present

## 2022-06-14 DIAGNOSIS — D61818 Other pancytopenia: Secondary | ICD-10-CM | POA: Diagnosis not present

## 2022-06-14 DIAGNOSIS — D539 Nutritional anemia, unspecified: Secondary | ICD-10-CM | POA: Diagnosis not present

## 2022-06-14 DIAGNOSIS — E872 Acidosis, unspecified: Secondary | ICD-10-CM | POA: Diagnosis not present

## 2022-06-14 DIAGNOSIS — R4701 Aphasia: Secondary | ICD-10-CM | POA: Diagnosis not present

## 2022-06-14 DIAGNOSIS — Y92009 Unspecified place in unspecified non-institutional (private) residence as the place of occurrence of the external cause: Secondary | ICD-10-CM | POA: Diagnosis not present

## 2022-06-14 DIAGNOSIS — N189 Chronic kidney disease, unspecified: Secondary | ICD-10-CM | POA: Diagnosis not present

## 2022-06-15 DIAGNOSIS — E8729 Other acidosis: Secondary | ICD-10-CM | POA: Diagnosis not present

## 2022-06-15 DIAGNOSIS — E872 Acidosis, unspecified: Secondary | ICD-10-CM | POA: Diagnosis not present

## 2022-06-15 DIAGNOSIS — M25572 Pain in left ankle and joints of left foot: Secondary | ICD-10-CM | POA: Diagnosis not present

## 2022-06-15 DIAGNOSIS — D539 Nutritional anemia, unspecified: Secondary | ICD-10-CM | POA: Diagnosis not present

## 2022-06-15 DIAGNOSIS — N183 Chronic kidney disease, stage 3 unspecified: Secondary | ICD-10-CM | POA: Diagnosis not present

## 2022-06-15 DIAGNOSIS — I639 Cerebral infarction, unspecified: Secondary | ICD-10-CM | POA: Diagnosis not present

## 2022-06-15 DIAGNOSIS — W19XXXA Unspecified fall, initial encounter: Secondary | ICD-10-CM | POA: Diagnosis not present

## 2022-06-15 DIAGNOSIS — I2510 Atherosclerotic heart disease of native coronary artery without angina pectoris: Secondary | ICD-10-CM | POA: Diagnosis not present

## 2022-06-15 DIAGNOSIS — I6201 Nontraumatic acute subdural hemorrhage: Secondary | ICD-10-CM | POA: Diagnosis not present

## 2022-06-15 DIAGNOSIS — I10 Essential (primary) hypertension: Secondary | ICD-10-CM | POA: Diagnosis not present

## 2022-06-15 DIAGNOSIS — N179 Acute kidney failure, unspecified: Secondary | ICD-10-CM | POA: Diagnosis not present

## 2022-06-15 DIAGNOSIS — D61818 Other pancytopenia: Secondary | ICD-10-CM | POA: Diagnosis not present

## 2022-06-15 DIAGNOSIS — Y92009 Unspecified place in unspecified non-institutional (private) residence as the place of occurrence of the external cause: Secondary | ICD-10-CM | POA: Diagnosis not present

## 2022-06-15 DIAGNOSIS — Y939 Activity, unspecified: Secondary | ICD-10-CM | POA: Diagnosis not present

## 2022-06-15 DIAGNOSIS — R4701 Aphasia: Secondary | ICD-10-CM | POA: Diagnosis not present

## 2022-06-15 DIAGNOSIS — N189 Chronic kidney disease, unspecified: Secondary | ICD-10-CM | POA: Diagnosis not present

## 2022-06-16 DIAGNOSIS — H409 Unspecified glaucoma: Secondary | ICD-10-CM | POA: Diagnosis not present

## 2022-06-16 DIAGNOSIS — W19XXXA Unspecified fall, initial encounter: Secondary | ICD-10-CM | POA: Diagnosis not present

## 2022-06-16 DIAGNOSIS — S065X9A Traumatic subdural hemorrhage with loss of consciousness of unspecified duration, initial encounter: Secondary | ICD-10-CM | POA: Diagnosis not present

## 2022-06-16 DIAGNOSIS — Z66 Do not resuscitate: Secondary | ICD-10-CM | POA: Diagnosis not present

## 2022-06-16 DIAGNOSIS — Z20822 Contact with and (suspected) exposure to covid-19: Secondary | ICD-10-CM | POA: Diagnosis not present

## 2022-06-16 DIAGNOSIS — N189 Chronic kidney disease, unspecified: Secondary | ICD-10-CM | POA: Diagnosis not present

## 2022-06-16 DIAGNOSIS — W19XXXD Unspecified fall, subsequent encounter: Secondary | ICD-10-CM | POA: Diagnosis not present

## 2022-06-16 DIAGNOSIS — R4701 Aphasia: Secondary | ICD-10-CM | POA: Diagnosis not present

## 2022-06-16 DIAGNOSIS — M7989 Other specified soft tissue disorders: Secondary | ICD-10-CM | POA: Diagnosis not present

## 2022-06-16 DIAGNOSIS — D689 Coagulation defect, unspecified: Secondary | ICD-10-CM | POA: Diagnosis not present

## 2022-06-16 DIAGNOSIS — D61818 Other pancytopenia: Secondary | ICD-10-CM | POA: Diagnosis not present

## 2022-06-16 DIAGNOSIS — I69320 Aphasia following cerebral infarction: Secondary | ICD-10-CM | POA: Diagnosis not present

## 2022-06-16 DIAGNOSIS — I509 Heart failure, unspecified: Secondary | ICD-10-CM | POA: Diagnosis not present

## 2022-06-16 DIAGNOSIS — Z743 Need for continuous supervision: Secondary | ICD-10-CM | POA: Diagnosis not present

## 2022-06-16 DIAGNOSIS — I63422 Cerebral infarction due to embolism of left anterior cerebral artery: Secondary | ICD-10-CM | POA: Diagnosis not present

## 2022-06-16 DIAGNOSIS — S63234A Subluxation of proximal interphalangeal joint of right ring finger, initial encounter: Secondary | ICD-10-CM | POA: Diagnosis not present

## 2022-06-16 DIAGNOSIS — Y92009 Unspecified place in unspecified non-institutional (private) residence as the place of occurrence of the external cause: Secondary | ICD-10-CM | POA: Diagnosis not present

## 2022-06-16 DIAGNOSIS — G4489 Other headache syndrome: Secondary | ICD-10-CM | POA: Diagnosis not present

## 2022-06-16 DIAGNOSIS — I6201 Nontraumatic acute subdural hemorrhage: Secondary | ICD-10-CM | POA: Diagnosis not present

## 2022-06-16 DIAGNOSIS — T07XXXA Unspecified multiple injuries, initial encounter: Secondary | ICD-10-CM | POA: Diagnosis not present

## 2022-06-16 DIAGNOSIS — G9341 Metabolic encephalopathy: Secondary | ICD-10-CM | POA: Diagnosis not present

## 2022-06-16 DIAGNOSIS — E8729 Other acidosis: Secondary | ICD-10-CM | POA: Diagnosis not present

## 2022-06-16 DIAGNOSIS — Z7901 Long term (current) use of anticoagulants: Secondary | ICD-10-CM | POA: Diagnosis not present

## 2022-06-16 DIAGNOSIS — D62 Acute posthemorrhagic anemia: Secondary | ICD-10-CM | POA: Diagnosis not present

## 2022-06-16 DIAGNOSIS — I959 Hypotension, unspecified: Secondary | ICD-10-CM | POA: Diagnosis not present

## 2022-06-16 DIAGNOSIS — I129 Hypertensive chronic kidney disease with stage 1 through stage 4 chronic kidney disease, or unspecified chronic kidney disease: Secondary | ICD-10-CM | POA: Diagnosis not present

## 2022-06-16 DIAGNOSIS — K219 Gastro-esophageal reflux disease without esophagitis: Secondary | ICD-10-CM | POA: Diagnosis not present

## 2022-06-16 DIAGNOSIS — E860 Dehydration: Secondary | ICD-10-CM | POA: Diagnosis not present

## 2022-06-16 DIAGNOSIS — N183 Chronic kidney disease, stage 3 unspecified: Secondary | ICD-10-CM | POA: Diagnosis not present

## 2022-06-16 DIAGNOSIS — Y939 Activity, unspecified: Secondary | ICD-10-CM | POA: Diagnosis not present

## 2022-06-16 DIAGNOSIS — S065X0A Traumatic subdural hemorrhage without loss of consciousness, initial encounter: Secondary | ICD-10-CM | POA: Diagnosis not present

## 2022-06-16 DIAGNOSIS — I130 Hypertensive heart and chronic kidney disease with heart failure and stage 1 through stage 4 chronic kidney disease, or unspecified chronic kidney disease: Secondary | ICD-10-CM | POA: Diagnosis not present

## 2022-06-16 DIAGNOSIS — I10 Essential (primary) hypertension: Secondary | ICD-10-CM | POA: Diagnosis not present

## 2022-06-16 DIAGNOSIS — J449 Chronic obstructive pulmonary disease, unspecified: Secondary | ICD-10-CM | POA: Diagnosis not present

## 2022-06-16 DIAGNOSIS — N170 Acute kidney failure with tubular necrosis: Secondary | ICD-10-CM | POA: Diagnosis not present

## 2022-06-16 DIAGNOSIS — D539 Nutritional anemia, unspecified: Secondary | ICD-10-CM | POA: Diagnosis not present

## 2022-06-16 DIAGNOSIS — R296 Repeated falls: Secondary | ICD-10-CM | POA: Diagnosis not present

## 2022-06-16 DIAGNOSIS — E039 Hypothyroidism, unspecified: Secondary | ICD-10-CM | POA: Diagnosis not present

## 2022-06-16 DIAGNOSIS — H5702 Anisocoria: Secondary | ICD-10-CM | POA: Diagnosis not present

## 2022-06-16 DIAGNOSIS — M25572 Pain in left ankle and joints of left foot: Secondary | ICD-10-CM | POA: Diagnosis not present

## 2022-06-16 DIAGNOSIS — I6200 Nontraumatic subdural hemorrhage, unspecified: Secondary | ICD-10-CM | POA: Diagnosis not present

## 2022-06-16 DIAGNOSIS — N179 Acute kidney failure, unspecified: Secondary | ICD-10-CM | POA: Diagnosis not present

## 2022-06-16 DIAGNOSIS — S065X0D Traumatic subdural hemorrhage without loss of consciousness, subsequent encounter: Secondary | ICD-10-CM | POA: Diagnosis not present

## 2022-06-16 DIAGNOSIS — I2510 Atherosclerotic heart disease of native coronary artery without angina pectoris: Secondary | ICD-10-CM | POA: Diagnosis not present

## 2022-06-16 DIAGNOSIS — I609 Nontraumatic subarachnoid hemorrhage, unspecified: Secondary | ICD-10-CM | POA: Diagnosis not present

## 2022-06-16 DIAGNOSIS — S065XAA Traumatic subdural hemorrhage with loss of consciousness status unknown, initial encounter: Secondary | ICD-10-CM | POA: Diagnosis not present

## 2022-06-16 DIAGNOSIS — R509 Fever, unspecified: Secondary | ICD-10-CM | POA: Diagnosis not present

## 2022-06-16 DIAGNOSIS — M1030 Gout due to renal impairment, unspecified site: Secondary | ICD-10-CM | POA: Diagnosis not present

## 2022-06-16 DIAGNOSIS — I351 Nonrheumatic aortic (valve) insufficiency: Secondary | ICD-10-CM | POA: Diagnosis not present

## 2022-06-16 DIAGNOSIS — I639 Cerebral infarction, unspecified: Secondary | ICD-10-CM | POA: Diagnosis not present

## 2022-06-16 DIAGNOSIS — S63254A Unspecified dislocation of right ring finger, initial encounter: Secondary | ICD-10-CM | POA: Diagnosis not present

## 2022-06-16 DIAGNOSIS — R490 Dysphonia: Secondary | ICD-10-CM | POA: Diagnosis not present

## 2022-06-16 DIAGNOSIS — R630 Anorexia: Secondary | ICD-10-CM | POA: Diagnosis not present

## 2022-06-16 DIAGNOSIS — R627 Adult failure to thrive: Secondary | ICD-10-CM | POA: Diagnosis not present

## 2022-06-16 DIAGNOSIS — E872 Acidosis, unspecified: Secondary | ICD-10-CM | POA: Diagnosis not present

## 2022-06-16 DIAGNOSIS — I340 Nonrheumatic mitral (valve) insufficiency: Secondary | ICD-10-CM | POA: Diagnosis not present

## 2022-06-16 DIAGNOSIS — Z85048 Personal history of other malignant neoplasm of rectum, rectosigmoid junction, and anus: Secondary | ICD-10-CM | POA: Diagnosis not present

## 2022-06-16 DIAGNOSIS — I480 Paroxysmal atrial fibrillation: Secondary | ICD-10-CM | POA: Diagnosis not present

## 2022-06-16 DIAGNOSIS — M109 Gout, unspecified: Secondary | ICD-10-CM | POA: Diagnosis not present

## 2022-06-19 DIAGNOSIS — H5702 Anisocoria: Secondary | ICD-10-CM | POA: Diagnosis not present

## 2022-06-19 DIAGNOSIS — S63234A Subluxation of proximal interphalangeal joint of right ring finger, initial encounter: Secondary | ICD-10-CM | POA: Diagnosis not present

## 2022-06-19 DIAGNOSIS — G459 Transient cerebral ischemic attack, unspecified: Secondary | ICD-10-CM | POA: Diagnosis not present

## 2022-06-19 DIAGNOSIS — R4182 Altered mental status, unspecified: Secondary | ICD-10-CM | POA: Diagnosis not present

## 2022-06-19 DIAGNOSIS — I351 Nonrheumatic aortic (valve) insufficiency: Secondary | ICD-10-CM | POA: Diagnosis not present

## 2022-06-19 DIAGNOSIS — R630 Anorexia: Secondary | ICD-10-CM | POA: Diagnosis not present

## 2022-06-19 DIAGNOSIS — D62 Acute posthemorrhagic anemia: Secondary | ICD-10-CM | POA: Diagnosis not present

## 2022-06-19 DIAGNOSIS — Z8673 Personal history of transient ischemic attack (TIA), and cerebral infarction without residual deficits: Secondary | ICD-10-CM | POA: Diagnosis not present

## 2022-06-19 DIAGNOSIS — R42 Dizziness and giddiness: Secondary | ICD-10-CM | POA: Diagnosis not present

## 2022-06-19 DIAGNOSIS — Z20822 Contact with and (suspected) exposure to covid-19: Secondary | ICD-10-CM | POA: Diagnosis not present

## 2022-06-19 DIAGNOSIS — I6502 Occlusion and stenosis of left vertebral artery: Secondary | ICD-10-CM | POA: Diagnosis not present

## 2022-06-19 DIAGNOSIS — C187 Malignant neoplasm of sigmoid colon: Secondary | ICD-10-CM | POA: Diagnosis not present

## 2022-06-19 DIAGNOSIS — I959 Hypotension, unspecified: Secondary | ICD-10-CM | POA: Diagnosis not present

## 2022-06-19 DIAGNOSIS — R451 Restlessness and agitation: Secondary | ICD-10-CM | POA: Diagnosis not present

## 2022-06-19 DIAGNOSIS — Z743 Need for continuous supervision: Secondary | ICD-10-CM | POA: Diagnosis not present

## 2022-06-19 DIAGNOSIS — J9811 Atelectasis: Secondary | ICD-10-CM | POA: Diagnosis not present

## 2022-06-19 DIAGNOSIS — R509 Fever, unspecified: Secondary | ICD-10-CM | POA: Diagnosis not present

## 2022-06-19 DIAGNOSIS — G319 Degenerative disease of nervous system, unspecified: Secondary | ICD-10-CM | POA: Diagnosis not present

## 2022-06-19 DIAGNOSIS — I6200 Nontraumatic subdural hemorrhage, unspecified: Secondary | ICD-10-CM | POA: Diagnosis not present

## 2022-06-19 DIAGNOSIS — G9340 Encephalopathy, unspecified: Secondary | ICD-10-CM | POA: Diagnosis not present

## 2022-06-19 DIAGNOSIS — F419 Anxiety disorder, unspecified: Secondary | ICD-10-CM | POA: Diagnosis not present

## 2022-06-19 DIAGNOSIS — Z515 Encounter for palliative care: Secondary | ICD-10-CM | POA: Diagnosis not present

## 2022-06-19 DIAGNOSIS — I6622 Occlusion and stenosis of left posterior cerebral artery: Secondary | ICD-10-CM | POA: Diagnosis not present

## 2022-06-19 DIAGNOSIS — R9401 Abnormal electroencephalogram [EEG]: Secondary | ICD-10-CM | POA: Diagnosis not present

## 2022-06-19 DIAGNOSIS — E860 Dehydration: Secondary | ICD-10-CM | POA: Diagnosis not present

## 2022-06-19 DIAGNOSIS — I2510 Atherosclerotic heart disease of native coronary artery without angina pectoris: Secondary | ICD-10-CM | POA: Diagnosis not present

## 2022-06-19 DIAGNOSIS — S065X9A Traumatic subdural hemorrhage with loss of consciousness of unspecified duration, initial encounter: Secondary | ICD-10-CM | POA: Diagnosis not present

## 2022-06-19 DIAGNOSIS — M539 Dorsopathy, unspecified: Secondary | ICD-10-CM | POA: Diagnosis not present

## 2022-06-19 DIAGNOSIS — R296 Repeated falls: Secondary | ICD-10-CM | POA: Diagnosis not present

## 2022-06-19 DIAGNOSIS — M79604 Pain in right leg: Secondary | ICD-10-CM | POA: Diagnosis not present

## 2022-06-19 DIAGNOSIS — D689 Coagulation defect, unspecified: Secondary | ICD-10-CM | POA: Diagnosis not present

## 2022-06-19 DIAGNOSIS — M1611 Unilateral primary osteoarthritis, right hip: Secondary | ICD-10-CM | POA: Diagnosis not present

## 2022-06-19 DIAGNOSIS — I129 Hypertensive chronic kidney disease with stage 1 through stage 4 chronic kidney disease, or unspecified chronic kidney disease: Secondary | ICD-10-CM | POA: Diagnosis not present

## 2022-06-19 DIAGNOSIS — S065XAA Traumatic subdural hemorrhage with loss of consciousness status unknown, initial encounter: Secondary | ICD-10-CM | POA: Diagnosis not present

## 2022-06-19 DIAGNOSIS — Z66 Do not resuscitate: Secondary | ICD-10-CM | POA: Diagnosis not present

## 2022-06-19 DIAGNOSIS — I480 Paroxysmal atrial fibrillation: Secondary | ICD-10-CM | POA: Diagnosis not present

## 2022-06-19 DIAGNOSIS — R627 Adult failure to thrive: Secondary | ICD-10-CM | POA: Diagnosis not present

## 2022-06-19 DIAGNOSIS — Z7189 Other specified counseling: Secondary | ICD-10-CM | POA: Diagnosis not present

## 2022-06-19 DIAGNOSIS — J449 Chronic obstructive pulmonary disease, unspecified: Secondary | ICD-10-CM | POA: Diagnosis not present

## 2022-06-19 DIAGNOSIS — Z736 Limitation of activities due to disability: Secondary | ICD-10-CM | POA: Diagnosis not present

## 2022-06-19 DIAGNOSIS — I639 Cerebral infarction, unspecified: Secondary | ICD-10-CM | POA: Diagnosis not present

## 2022-06-19 DIAGNOSIS — E039 Hypothyroidism, unspecified: Secondary | ICD-10-CM | POA: Diagnosis not present

## 2022-06-19 DIAGNOSIS — M7989 Other specified soft tissue disorders: Secondary | ICD-10-CM | POA: Diagnosis not present

## 2022-06-19 DIAGNOSIS — R531 Weakness: Secondary | ICD-10-CM | POA: Diagnosis not present

## 2022-06-19 DIAGNOSIS — I679 Cerebrovascular disease, unspecified: Secondary | ICD-10-CM | POA: Diagnosis not present

## 2022-06-19 DIAGNOSIS — S065X0A Traumatic subdural hemorrhage without loss of consciousness, initial encounter: Secondary | ICD-10-CM | POA: Diagnosis not present

## 2022-06-19 DIAGNOSIS — R9082 White matter disease, unspecified: Secondary | ICD-10-CM | POA: Diagnosis not present

## 2022-06-19 DIAGNOSIS — I609 Nontraumatic subarachnoid hemorrhage, unspecified: Secondary | ICD-10-CM | POA: Diagnosis not present

## 2022-06-19 DIAGNOSIS — I340 Nonrheumatic mitral (valve) insufficiency: Secondary | ICD-10-CM | POA: Diagnosis not present

## 2022-06-19 DIAGNOSIS — E872 Acidosis, unspecified: Secondary | ICD-10-CM | POA: Diagnosis not present

## 2022-06-19 DIAGNOSIS — G4489 Other headache syndrome: Secondary | ICD-10-CM | POA: Diagnosis not present

## 2022-06-19 DIAGNOSIS — I700 Atherosclerosis of aorta: Secondary | ICD-10-CM | POA: Diagnosis not present

## 2022-06-19 DIAGNOSIS — D61818 Other pancytopenia: Secondary | ICD-10-CM | POA: Diagnosis not present

## 2022-06-19 DIAGNOSIS — S63254A Unspecified dislocation of right ring finger, initial encounter: Secondary | ICD-10-CM | POA: Diagnosis not present

## 2022-06-19 DIAGNOSIS — J984 Other disorders of lung: Secondary | ICD-10-CM | POA: Diagnosis not present

## 2022-06-19 DIAGNOSIS — T07XXXA Unspecified multiple injuries, initial encounter: Secondary | ICD-10-CM | POA: Diagnosis not present

## 2022-06-19 DIAGNOSIS — R52 Pain, unspecified: Secondary | ICD-10-CM | POA: Diagnosis not present

## 2022-06-19 DIAGNOSIS — K5900 Constipation, unspecified: Secondary | ICD-10-CM | POA: Diagnosis not present

## 2022-06-19 DIAGNOSIS — G936 Cerebral edema: Secondary | ICD-10-CM | POA: Diagnosis not present

## 2022-06-19 DIAGNOSIS — Z7409 Other reduced mobility: Secondary | ICD-10-CM | POA: Diagnosis not present

## 2022-06-19 DIAGNOSIS — R0600 Dyspnea, unspecified: Secondary | ICD-10-CM | POA: Diagnosis not present

## 2022-06-19 DIAGNOSIS — N289 Disorder of kidney and ureter, unspecified: Secondary | ICD-10-CM | POA: Diagnosis not present

## 2022-06-19 DIAGNOSIS — M159 Polyosteoarthritis, unspecified: Secondary | ICD-10-CM | POA: Diagnosis not present

## 2022-06-19 DIAGNOSIS — N179 Acute kidney failure, unspecified: Secondary | ICD-10-CM | POA: Diagnosis not present

## 2022-06-19 DIAGNOSIS — K5790 Diverticulosis of intestine, part unspecified, without perforation or abscess without bleeding: Secondary | ICD-10-CM | POA: Diagnosis not present

## 2022-06-19 DIAGNOSIS — G9341 Metabolic encephalopathy: Secondary | ICD-10-CM | POA: Diagnosis not present

## 2022-06-19 DIAGNOSIS — M109 Gout, unspecified: Secondary | ICD-10-CM | POA: Diagnosis not present

## 2022-06-19 DIAGNOSIS — R55 Syncope and collapse: Secondary | ICD-10-CM | POA: Diagnosis not present

## 2022-06-19 DIAGNOSIS — I10 Essential (primary) hypertension: Secondary | ICD-10-CM | POA: Diagnosis not present

## 2022-07-09 ENCOUNTER — Telehealth: Payer: Self-pay | Admitting: Hematology & Oncology

## 2022-07-09 NOTE — Telephone Encounter (Signed)
Received call from Spouse that pt had passed on 07/22/22. Confirmed with local obituary.

## 2022-07-10 DEATH — deceased

## 2022-09-01 ENCOUNTER — Ambulatory Visit: Payer: PPO | Admitting: Family Medicine

## 2022-09-09 DEATH — deceased

## 2022-10-14 ENCOUNTER — Ambulatory Visit: Payer: PPO | Admitting: Cardiology

## 2023-06-30 IMAGING — DX DG TOE GREAT 2+V*L*
3 series · 3 of 3 positions shown · non-contrast
Comparison: None.

CLINICAL DATA: Left first metatarsophalangeal joint pain with
redness and warmth for 3 days.

EXAM:
LEFT GREAT TOE

[toe ap]
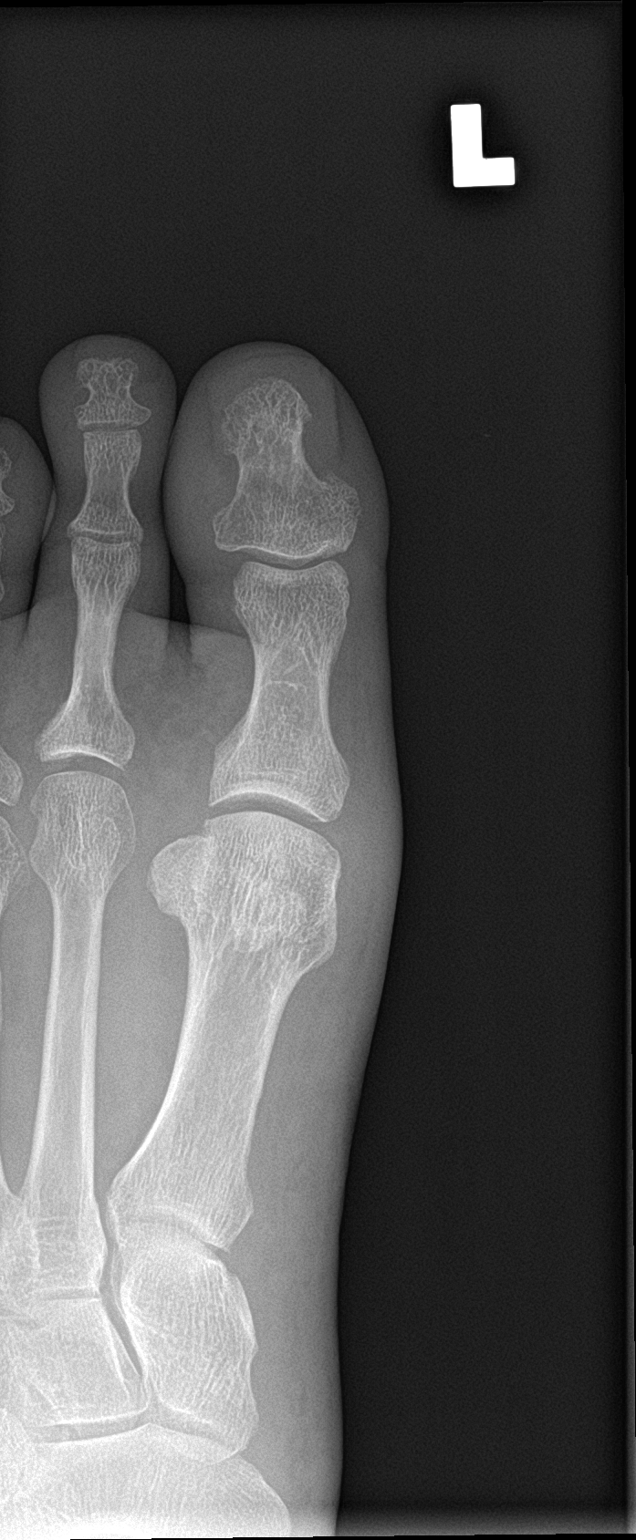

[toe obl]
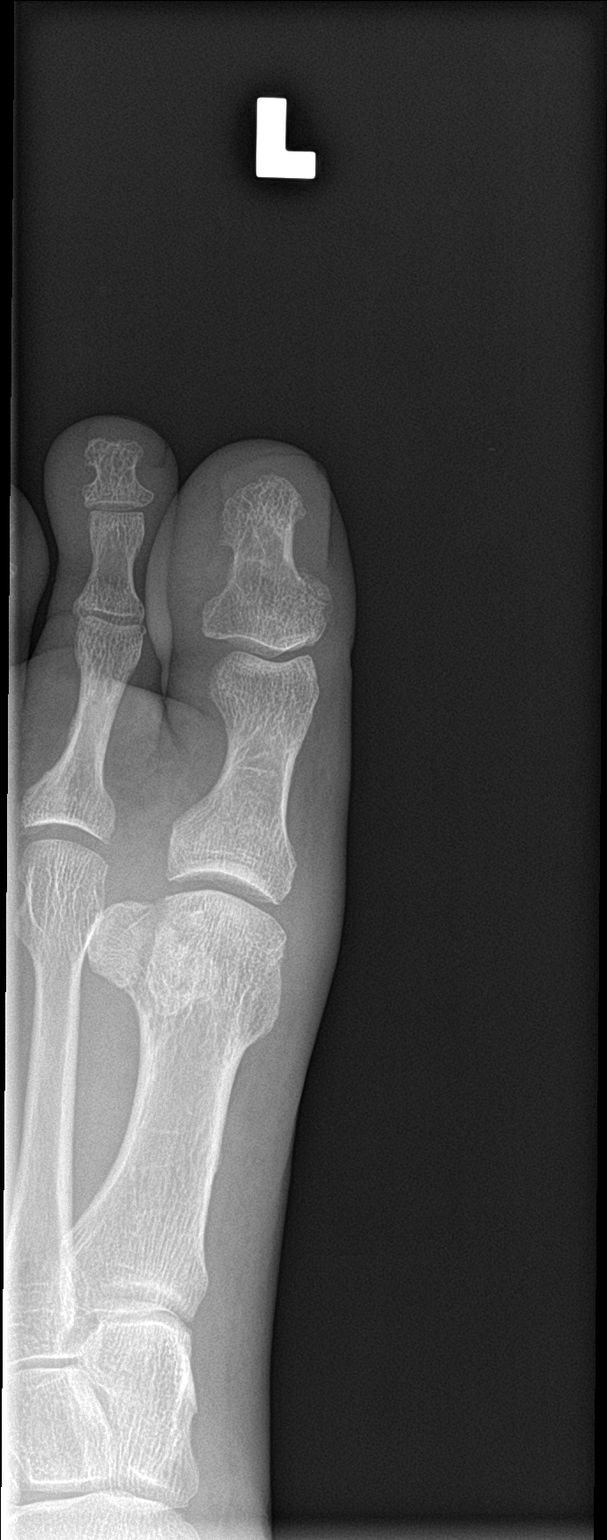

[toe lat]
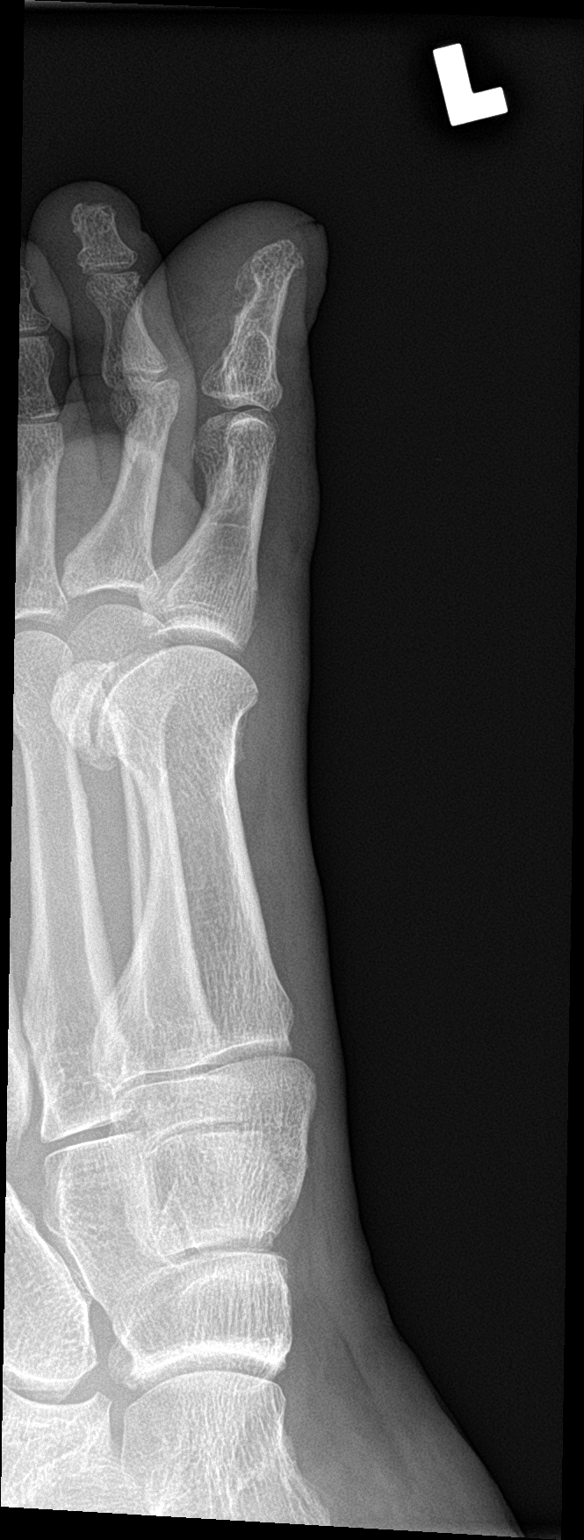

[3 of 3 positions shown; findings below may reference images not displayed]

FINDINGS: Normal bone mineralization. Joint spaces are preserved. No acute
fracture is seen. No dislocation.
IMPRESSION: Normal left great toe radiographs.
